# Patient Record
Sex: Female | Born: 1950
Health system: Southern US, Community
[De-identification: ages and names within clinical notes are randomized; demographics above are authoritative.]

## PROBLEM LIST (undated history)

## (undated) DIAGNOSIS — F32A Depression, unspecified: Secondary | ICD-10-CM

## (undated) DIAGNOSIS — J984 Other disorders of lung: Secondary | ICD-10-CM

## (undated) DIAGNOSIS — J189 Pneumonia, unspecified organism: Secondary | ICD-10-CM

## (undated) DIAGNOSIS — I10 Essential (primary) hypertension: Secondary | ICD-10-CM

## (undated) DIAGNOSIS — J45909 Unspecified asthma, uncomplicated: Secondary | ICD-10-CM

## (undated) DIAGNOSIS — F329 Major depressive disorder, single episode, unspecified: Secondary | ICD-10-CM

## (undated) DIAGNOSIS — M109 Gout, unspecified: Secondary | ICD-10-CM

## (undated) DIAGNOSIS — K219 Gastro-esophageal reflux disease without esophagitis: Secondary | ICD-10-CM

## (undated) DIAGNOSIS — H3320 Serous retinal detachment, unspecified eye: Secondary | ICD-10-CM

## (undated) DIAGNOSIS — E119 Type 2 diabetes mellitus without complications: Secondary | ICD-10-CM

## (undated) DIAGNOSIS — J40 Bronchitis, not specified as acute or chronic: Secondary | ICD-10-CM

## (undated) DIAGNOSIS — H269 Unspecified cataract: Secondary | ICD-10-CM

## (undated) DIAGNOSIS — E785 Hyperlipidemia, unspecified: Secondary | ICD-10-CM

## (undated) HISTORY — DX: Other disorders of lung: J98.4

## (undated) HISTORY — PX: BREAST BIOPSY: SHX20

## (undated) HISTORY — PX: TUBAL LIGATION: SHX77

## (undated) HISTORY — PX: ANKLE SURGERY: SHX546

## (undated) HISTORY — PX: MELANOMA EXCISION: SHX5266

## (undated) HISTORY — PX: EYE SURGERY: SHX253

## (undated) HISTORY — PX: TONSILLECTOMY: SUR1361

## (undated) HISTORY — PX: BLADDER SURGERY: SHX569

## (undated) NOTE — Progress Notes (Signed)
 Associated Order(s): Laceration Repair Post-Procedure Diagnose(s): Laceration of right elbow, initial encounter Formatting of this note is different from the original. Images from the original note were not included. [facility]   Subjective:   Patient ID: Samantha Mcgrath is an 55 y.o. female.  Chief Complaint:  Chief Complaint  Patient presents with  ? Laceration    Right elbow, fell today   [address] - [place], [place] - [address]  HPI: Samantha Mcgrath is an 23 y.o. female who presents to urgent care with complaints of right elbow injury. The patient notes she fell while walking down stairs when she fell on to right elbow and sustained a laceration. She denies any pain at this time. She denies any other associated symptoms.   Laceration     No past medical history on file.  I have reviewed surgical, social, and family history.  Allergies  Allergen Reactions  ? Anesthetics - [place]ster Type- (Drug Class Allergy) Palpitations and Other (See Comments)    Novocain. ALSO BLISTERS WITH TOPICAL,    Novocain. ALSO BLISTERS WITH TOPICAL  ALSO BLISTERS WITH TOPICAL  Novacaine    Novocain. ALSO BLISTERS WITH TOPICAL  ALSO BLISTERS WITH TOPICAL   Current Outpatient Medications  Medication Instructions  ? albuterol (VENTOLIN HFA) 90 mcg/Actuation inhaler INHALE 1 TO 2 PUFFS EVERY 4 HOURS AS NEEDED  ? amLODIPine  (NORVASC ) 10 mg  ? atorvastatin  (LIPITOR) 20 mg, DAILY  ? Chlorthalidone  25 mg tablet TAKE 1 TABLET BY MOUTH EVERY DAY IN THE MORNING WITH FOOD FOR 90 DAYS  ? diclofenac  sodium (VOLTAREN ) 1 % topical gel APPLY 2 GRAMS TOPICALLY 4 (FOUR) TIMES DAILY. APPLY TO LEFT KNEE  ? dicyclomine (BENTYL) 10 mg  ? dulaglutide (TRULICITY) 1.5 mg  ? empagliflozin (JARDIANCE) 25 mg, EVERY MORNING  ? losartan  (COZAAR ) 100 mg  ? montelukast  (SINGULAIR ) 10 mg  ? pantoprazole  (PROTONIX ) 40 mg, TWICE A DAY  ? tiZANidine  (ZANAFLEX ) 4 mg tablet TAKE 1 TABLET  ORALLY TWICE A DAY AS NEEDED 90 DAYS   Review of Systems  Constitutional:  Negative for chills, diaphoresis and fatigue.  HENT:  Negative for congestion, sore throat and trouble swallowing.   Eyes:  Negative for pain and redness.  Respiratory:  Negative for cough, shortness of breath and wheezing.   Cardiovascular:  Negative for chest pain.  Gastrointestinal:  Negative for constipation and diarrhea.  Genitourinary:  Negative for dysuria, frequency and urgency.  Musculoskeletal:  Negative for arthralgias, joint swelling and myalgias.  Skin:  Positive for wound. Negative for rash.  Neurological:  Negative for dizziness and headaches.   Objective:   BP 125/63   Pulse 73   Temp 98.2 F (36.8 C) (Tympanic)   Resp 18   Ht 5' 5 (165.1 cm)   Wt 92.7 kg (204 lb 6.4 oz)   SpO2 99%   BMI 34.01 kg/m   Physical Exam Vitals and nursing note reviewed.  Constitutional:      General: She is not in acute distress.    Appearance: Normal appearance. She is well-developed. She is not ill-appearing or toxic-appearing.  HENT:     Head: Normocephalic and atraumatic.     Nose: Nose normal.     Mouth/Throat:     Mouth: Mucous membranes are moist.     Pharynx: Oropharynx is clear.  Pulmonary:     Effort: Pulmonary effort is normal.   Musculoskeletal:     Cervical back: Normal range of motion and neck supple.  Skin:    General: Skin is warm.     Capillary Refill: Capillary refill takes less than 2 seconds.     Findings: Laceration and wound present. No rash.      Comments: 1.5cm linear/stellate laceration over right elbow   Neurological:     General: No focal deficit present.     Mental Status: She is alert and oriented to person, place, and time.   Psychiatric:        Behavior: Behavior is cooperative.   Laceration Repair  Date/Time: 01/09/2024 8:11 PM  Performed by: Rosine Toribio Lot, APRN Authorized by: Rosine Toribio Lot, APRN   Consent:    Consent obtained:  Verbal    Consent given by:  Patient   Risks, benefits, and alternatives were discussed: yes     Risks discussed:  Infection, pain and poor cosmetic result   Alternatives discussed:  No treatment Universal protocol:    Procedure explained and questions answered to patient or proxy's satisfaction: yes     Patient identity confirmed:  Verbally with patient Anesthesia:    Anesthesia method:  Local infiltration   Local anesthetic:  Lidocaine  1% w/o epi Laceration details:    Location:  Shoulder/arm   Shoulder/arm location:  R elbow   Length (cm):  1.5   Depth (mm):  6 Pre-procedure details:    Preparation:  Patient was prepped and draped in usual sterile fashion Exploration:    Wound exploration: wound explored through full range of motion and entire depth of wound visualized     Wound extent: no foreign bodies/material noted, no muscle damage noted and no tendon damage noted     Contaminated: no   Treatment:    Area cleansed with:  Povidone-iodine   Amount of cleaning:  Standard   Irrigation solution:  Sterile saline   Irrigation volume:  30   Irrigation method:  Syringe   Visualized foreign bodies/material removed: no     Debridement:  None   Undermining:  None Skin repair:    Repair method:  Sutures   Suture size:  4-0   Suture material:  Nylon   Suture technique:  Simple interrupted   Number of sutures:  4 Approximation:    Approximation:  Close Repair type:    Repair type:  Simple Post-procedure details:    Dressing:  Adhesive bandage   Procedure completion:  Tolerated well, no immediate complications  Assessment:   1. Laceration of right elbow, initial encounter  Laceration Repair    No results found for any visits on 01/09/24.  Plan:   Last TDAP- 10/2014 - pt declines booster.  -Take medications as prescribed. -Medication dose, route and expected duration of medication explained to patient  -If treated with an antibiotic the benefits and risks of antibiotic use have  been discussed with the patient. Patient agrees to take fully and as directed.  -Follow up with Family Provider in 5-7 days if no better. -Answered any questions and concerns of patient -Discussed worsening sxs that would warrant ED evaluation. -Go to ER if symptoms worsen. -Patient understands this plan of care   Patient Instructions: Return in 10-14 days for suture removal Keep the wound clean and dry.  Cover your wound while in any dirty or dusty environments. Keep sutures dry for the first 24 hours, then gently clean with soap and water and pat dry.  Do not soak your wound. Monitor for signs or symptoms of infection including redness, warmth, yellow or thick drainage, or fever.  Follow up immediately if these symptoms occur. Electronically signed by Rosine Toribio Lot, APRN at 01/09/2024  8:33 PM EDT

---
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
## 2006-01-05 ENCOUNTER — Ambulatory Visit: Payer: Self-pay | Admitting: Family Medicine

## 2014-06-01 DIAGNOSIS — I10 Essential (primary) hypertension: Secondary | ICD-10-CM | POA: Insufficient documentation

## 2015-08-17 DIAGNOSIS — L97912 Non-pressure chronic ulcer of unspecified part of right lower leg with fat layer exposed: Secondary | ICD-10-CM | POA: Insufficient documentation

## 2015-08-17 DIAGNOSIS — T8131XA Disruption of external operation (surgical) wound, not elsewhere classified, initial encounter: Secondary | ICD-10-CM | POA: Insufficient documentation

## 2015-08-17 DIAGNOSIS — IMO0001 Reserved for inherently not codable concepts without codable children: Secondary | ICD-10-CM | POA: Insufficient documentation

## 2016-11-14 DIAGNOSIS — H33022 Retinal detachment with multiple breaks, left eye: Secondary | ICD-10-CM | POA: Insufficient documentation

## 2018-01-28 DIAGNOSIS — J45909 Unspecified asthma, uncomplicated: Secondary | ICD-10-CM | POA: Insufficient documentation

## 2018-01-28 DIAGNOSIS — E785 Hyperlipidemia, unspecified: Secondary | ICD-10-CM | POA: Insufficient documentation

## 2018-01-28 DIAGNOSIS — E119 Type 2 diabetes mellitus without complications: Secondary | ICD-10-CM | POA: Insufficient documentation

## 2018-01-28 DIAGNOSIS — Z794 Long term (current) use of insulin: Secondary | ICD-10-CM | POA: Insufficient documentation

## 2018-01-29 DIAGNOSIS — K219 Gastro-esophageal reflux disease without esophagitis: Secondary | ICD-10-CM | POA: Insufficient documentation

## 2018-08-15 DIAGNOSIS — H25811 Combined forms of age-related cataract, right eye: Secondary | ICD-10-CM | POA: Insufficient documentation

## 2018-12-24 DIAGNOSIS — M1712 Unilateral primary osteoarthritis, left knee: Secondary | ICD-10-CM | POA: Insufficient documentation

## 2019-03-28 DIAGNOSIS — F5101 Primary insomnia: Secondary | ICD-10-CM | POA: Insufficient documentation

## 2019-07-08 DIAGNOSIS — F419 Anxiety disorder, unspecified: Secondary | ICD-10-CM | POA: Insufficient documentation

## 2019-10-08 DIAGNOSIS — N1831 Chronic kidney disease, stage 3a: Secondary | ICD-10-CM | POA: Insufficient documentation

## 2019-10-08 DIAGNOSIS — N1832 Chronic kidney disease, stage 3b: Secondary | ICD-10-CM | POA: Insufficient documentation

## 2019-11-08 ENCOUNTER — Emergency Department
Admission: EM | Admit: 2019-11-08 | Discharge: 2019-11-08 | Disposition: A | Discharge status: Home / Self Care | Payer: Medicare HMO | Source: Home / Self Care | Attending: Family Medicine | Admitting: Family Medicine

## 2019-11-08 ENCOUNTER — Encounter: Payer: Self-pay | Admitting: Emergency Medicine

## 2019-11-08 ENCOUNTER — Other Ambulatory Visit: Payer: Self-pay

## 2019-11-08 DIAGNOSIS — J069 Acute upper respiratory infection, unspecified: Secondary | ICD-10-CM

## 2019-11-08 DIAGNOSIS — B349 Viral infection, unspecified: Secondary | ICD-10-CM

## 2019-11-08 DIAGNOSIS — J9801 Acute bronchospasm: Secondary | ICD-10-CM

## 2019-11-08 HISTORY — DX: Gout, unspecified: M10.9

## 2019-11-08 HISTORY — DX: Depression, unspecified: F32.A

## 2019-11-08 HISTORY — DX: Type 2 diabetes mellitus without complications: E11.9

## 2019-11-08 HISTORY — DX: Bronchitis, not specified as acute or chronic: J40

## 2019-11-08 HISTORY — DX: Hyperlipidemia, unspecified: E78.5

## 2019-11-08 HISTORY — DX: Essential (primary) hypertension: I10

## 2019-11-08 HISTORY — DX: Serous retinal detachment, unspecified eye: H33.20

## 2019-11-08 HISTORY — DX: Unspecified asthma, uncomplicated: J45.909

## 2019-11-08 HISTORY — DX: Gastro-esophageal reflux disease without esophagitis: K21.9

## 2019-11-08 HISTORY — DX: Unspecified cataract: H26.9

## 2019-11-08 HISTORY — DX: Pneumonia, unspecified organism: J18.9

## 2019-11-08 MED ORDER — DOXYCYCLINE HYCLATE 100 MG PO CAPS: 100.0000 mg | ORAL_CAPSULE | Freq: Two times a day (BID) | ORAL | 0 refills | Status: DC

## 2019-11-08 MED ORDER — METHYLPREDNISOLONE SODIUM SUCC 125 MG IJ SOLR
80.0000 mg | Freq: Once | INTRAMUSCULAR | Status: AC
  Administered 2019-11-08: 80 mg via INTRAMUSCULAR

## 2019-11-08 MED ORDER — HYDROCODONE-GUAIFENESIN 2.5-200 MG/5ML PO SOLN: ORAL | 0 refills | Status: DC

## 2019-11-08 MED ORDER — PREDNISONE 20 MG PO TABS
ORAL_TABLET | ORAL | 0 refills | Status: DC
Start: 2019-11-08 — End: 2020-07-12

## 2019-11-08 NOTE — ED Provider Notes (Signed)
[facility]    CSN: [id number] Arrival date & time: 11/08/19  1327      History   Chief Complaint Chief Complaint  Patient presents with  . Nasal Congestion  . Cough    HPI Samantha Mcgrath is a 69 y.o. female.   Patient has seasonal rhinitis for which she takes Zyrtec, and her congestion became worse during the past week.  She developed a non-productive cough 4 days ago, worse at night.  She denies pleuritic pain and shortness of breath.  She denies fevers, chills, and sweats.  She has had both COVID19 injections.  She had pneumonia in 2017 She has type 2 diabetes, and states that her Hgb A1c was 6.9 several weeks ago. She states that she is preparing to go to the [facility].  The history is provided by the patient.    Past Medical History:  Diagnosis Date  . Asthma   . Bronchitis   . Cataract    bilateral  . Depression   . Diabetes mellitus without complication ([place])   . GERD (gastroesophageal reflux disease)   . Gout   . Hyperlipidemia   . Hypertension   . Pneumonia   . Retinal detachment    left    Patient Active Problem List   Diagnosis Date Noted  . Stage 3b chronic kidney disease 10/08/2019  . Anxiety 07/08/2019  . Combined forms of age-related cataract of right eye 08/15/2018  . Gastroesophageal reflux disease without esophagitis 01/29/2018  . Asthma 01/28/2018  . Hyperlipidemia 01/28/2018  . Type 2 diabetes mellitus without complication, with long-term current use of insulin ([place]) 01/28/2018  . Retinal detachment of left eye with multiple breaks 11/14/2016  . Morbid obesity ([place]) 08/17/2015  . Essential hypertension 06/01/2014    Past Surgical History:  Procedure Laterality Date  . BLADDER SURGERY    . EYE SURGERY    . TONSILLECTOMY    . TUBAL LIGATION      OB History   No obstetric history on file.      Home Medications    Prior to Admission medications   Medication Sig Start Date End Date Taking? Authorizing Provider   atorvastatin (LIPITOR) 10 MG tablet Take by mouth. 05/12/19  Yes [provider]  baclofen (LIORESAL) 20 MG tablet Take by mouth. 11/04/19 12/04/19 Yes [provider]  chlorthalidone (HYGROTON) 50 MG tablet Take by mouth. 05/12/19  Yes [provider]  etodolac (LODINE) 500 MG tablet TAKE ONE TABLET BY MOUTH TWICE A DAY 11/05/19  Yes [provider]  famotidine (PEPCID) 40 MG tablet Take by mouth. 05/27/19  Yes [provider]  glipiZIDE (GLUCOTROL XL) 5 MG 24 hr tablet Take by mouth. 04/16/19  Yes [provider]  liraglutide (VICTOZA) 18 MG/3ML SOPN  05/07/18  Yes [provider]  metFORMIN (GLUCOPHAGE-XR) 500 MG 24 hr tablet TAKE TWO TABLETS BY MOUTH TWICE A DAY 08/26/16  Yes [provider]  montelukast (SINGULAIR) 10 MG tablet Take by mouth. 11/09/16  Yes [provider]  pantoprazole (PROTONIX) 40 MG tablet Take by mouth. 07/30/18  Yes [provider]  pravastatin (PRAVACHOL) 40 MG tablet TAKE 1 TABLET BY MOUTH AT BEDTIME. 10/08/16  Yes [provider]  sertraline (ZOLOFT) 50 MG tablet Take by mouth. 06/11/18  Yes [provider]  tiZANidine (ZANAFLEX) 4 MG tablet Take by mouth. 04/18/19  Yes [provider]  doxycycline (VIBRAMYCIN) 100 MG capsule Take 1 capsule (100 mg total) by mouth  2 (two) times daily. Take with food (Rx void after 11/18/19) 11/08/19   Kandra Nicolas, MD  HYDROcodone-guaiFENesin 2.5-200 MG/5ML SOLN Take 44mL PO Q6hr PRN cough 11/08/19   Kandra Nicolas, MD  ketorolac (ACULAR) 0.4 % SOLN 1 drop 4 (four) times daily.    [provider]  moxifloxacin (VIGAMOX) 0.5 % ophthalmic solution 1 drop 4 (four) times daily.    [provider]  predniSONE (DELTASONE) 20 MG tablet Take one tab by mouth twice daily for 4 days, then one daily for 3 days. Take with food. 11/08/19   Kandra Nicolas, MD    Family History No family history on file.  Social History Social  History   Tobacco Use  . Smoking status: Never Smoker  . Smokeless tobacco: Never Used  Substance Use Topics  . Alcohol use: Never  . Drug use: Not on file     Allergies   Procaine   Review of Systems Review of Systems + sore throat + cough No pleuritic pain + wheezing + nasal congestion + post-nasal drainage No sinus pain/pressure No itchy/red eyes No earache No hemoptysis No SOB No fever/chills No nausea No vomiting No abdominal pain No diarrhea No urinary symptoms No skin rash + fatigue No myalgias No headache Used OTC meds (Zyrtec) without relief   Physical Exam Triage Vital Signs ED Triage Vitals  Enc Vitals Group     BP 11/08/19 1355 (!) 181/74     Pulse Rate 11/08/19 1355 78     Resp 11/08/19 1355 18     Temp 11/08/19 1355 98.3 F (36.8 C)     Temp Source 11/08/19 1355 Oral     SpO2 11/08/19 1355 95 %     Weight 11/08/19 1356 230 lb (104.3 kg)     Height 11/08/19 1356 5' 5.5" (1.664 m)     Head Circumference --      Peak Flow --      Pain Score 11/08/19 1356 0     Pain Loc --      Pain Edu? --      Excl. in [place]? --    No data found.  Updated Vital Signs BP (!) 181/74 (BP Location: Right Arm)   Pulse 78   Temp 98.3 F (36.8 C) (Oral)   Resp 18   Ht 5' 5.5" (1.664 m)   Wt 104.3 kg   SpO2 95%   BMI 37.69 kg/m   Visual Acuity Right Eye Distance:   Left Eye Distance:   Bilateral Distance:    Right Eye Near:   Left Eye Near:    Bilateral Near:     Physical Exam Nursing notes and Vital Signs reviewed. Appearance:  Patient appears stated age, and in no acute distress Eyes:  Pupils are equal, round, and reactive to light and accomodation.  Extraocular movement is intact.  Conjunctivae are not inflamed  Ears:  Canals normal.  Tympanic membranes normal.  Nose:  Mildly congested turbinates.  No sinus tenderness.  Pharynx:  Normal Neck:  Supple.  Mildly enlarged lateral nodes are present, tender to palpation on the left.   Lungs:   Clear to auscultation.  Breath sounds are equal.  Moving air well. Heart:  Regular rate and rhythm without murmurs, rubs, or gallops.  Abdomen:  Nontender without masses or hepatosplenomegaly.  Bowel sounds are present.  No CVA or flank tenderness.  Extremities:  No edema.  Skin:  No rash present.   UC Treatments / Results  Labs (all labs ordered are listed, but only abnormal results are displayed) Labs Reviewed - No data to display  EKG   Radiology No results found.  Procedures Procedures (including critical care time)  Medications Ordered in UC Medications  methylPREDNISolone sodium succinate (SOLU-MEDROL) 125 mg/2 mL injection 80 mg (80 mg Intramuscular Given 11/08/19 1503)    Initial Impression / Assessment and Plan / UC Course  I have reviewed the triage vital signs and the nursing notes.  Pertinent labs & imaging results that were available during my care of the patient were reviewed by me and considered in my medical decision making (see chart for details).    There is no evidence of bacterial infection today.  Begin prednisone burst/taper. Rx for hydrocodone/guaifenesin syrup at patient's request.  Controlled Substance Prescriptions I have consulted the [place] Controlled Substances Registry for this patient, and feel the risk/benefit ratio today is favorable for proceeding with this prescription for a controlled substance.     Final Clinical Impressions(s) / UC Diagnoses   Final diagnoses:  Viral URI with cough  Acute bronchospasm due to viral infection     Discharge Instructions     Begin prednisone Sunday 11/09/19. Take plain guaifenesin (1200mg  extended release tabs such as Mucinex) twice daily, with plenty of water, for cough and congestion.  Get adequate rest.   May use Afrin nasal spray (or generic oxymetazoline) each morning for about 5 days and then discontinue.  Also recommend using saline nasal spray several times daily and saline nasal irrigation (AYR is a  common brand).  Use Flonase nasal spray each morning after using Afrin nasal spray and saline nasal irrigation. Try warm salt water gargles for sore throat.  Stop all antihistamines (Zyrtec, etc) for now, and other non-prescription cough/cold preparations. Begin doxycycline if not improving about one week or if persistent fever develops (Given a prescription to hold, with an expiration date)   Try to limit use of cough suppressant to bedtime after 2 to 3 days.  If symptoms become significantly worse during the night or over the weekend, proceed to the local emergency room.        ED Prescriptions    Medication Sig Dispense Auth. Provider   predniSONE (DELTASONE) 20 MG tablet Take one tab by mouth twice daily for 4 days, then one daily for 3 days. Take with food. 11 tablet Kandra Nicolas, MD   HYDROcodone-guaiFENesin 2.5-200 MG/5ML SOLN Take 62mL PO Q6hr PRN cough 100 mL Kandra Nicolas, MD   doxycycline (VIBRAMYCIN) 100 MG capsule Take 1 capsule (100 mg total) by mouth 2 (two) times daily. Take with food (Rx void after 11/18/19) 20 capsule Kandra Nicolas, MD        Kandra Nicolas, MD 11/10/19 [phone]

## 2019-11-08 NOTE — ED Triage Notes (Signed)
Patient reports a chronic cough; for past 4 days cough is more concerning and she is congested; denies fever; has history of bronchitis and pneumonia but does not feel chest imaging is necessary today; no OTCs.  Has had both doses covid vaccine in 1/21.

## 2019-11-08 NOTE — Discharge Instructions (Addendum)
Begin prednisone Sunday 11/09/19. Take plain guaifenesin (1200mg  extended release tabs such as Mucinex) twice daily, with plenty of water, for cough and congestion.  Get adequate rest.   May use Afrin nasal spray (or generic oxymetazoline) each morning for about 5 days and then discontinue.  Also recommend using saline nasal spray several times daily and saline nasal irrigation (AYR is a common brand).  Use Flonase nasal spray each morning after using Afrin nasal spray and saline nasal irrigation. Try warm salt water gargles for sore throat.  Stop all antihistamines (Zyrtec, etc) for now, and other non-prescription cough/cold preparations. Begin doxycycline if not improving about one week or if persistent fever develops  Try to limit use of cough suppressant to bedtime after 2 to 3 days.  If symptoms become significantly worse during the night or over the weekend, proceed to the local emergency room.

## 2020-01-06 DIAGNOSIS — R053 Chronic cough: Secondary | ICD-10-CM | POA: Insufficient documentation

## 2020-06-22 ENCOUNTER — Ambulatory Visit: Payer: Self-pay | Admitting: Family

## 2020-06-28 ENCOUNTER — Telehealth: Payer: Self-pay

## 2020-06-28 NOTE — Telephone Encounter (Signed)
Patient has an appointment with you on tomorrow and she has not received her new patient packet as of today. If packet is not received today patient will be coming in early tomorrow to fill it out. Please advise

## 2020-06-29 ENCOUNTER — Encounter: Payer: Self-pay | Admitting: Orthopedic Surgery

## 2020-06-29 ENCOUNTER — Other Ambulatory Visit: Payer: Self-pay

## 2020-06-29 ENCOUNTER — Ambulatory Visit (INDEPENDENT_AMBULATORY_CARE_PROVIDER_SITE_OTHER): Payer: Medicare HMO | Admitting: Orthopedic Surgery

## 2020-06-29 VITALS — BP 165/90 | HR 69 | Temp 97.5°F | Resp 20 | Ht 66.0 in | Wt 228.0 lb

## 2020-06-29 DIAGNOSIS — G2581 Restless legs syndrome: Secondary | ICD-10-CM

## 2020-06-29 DIAGNOSIS — R69 Illness, unspecified: Secondary | ICD-10-CM | POA: Diagnosis not present

## 2020-06-29 DIAGNOSIS — G4700 Insomnia, unspecified: Secondary | ICD-10-CM | POA: Diagnosis not present

## 2020-06-29 DIAGNOSIS — K219 Gastro-esophageal reflux disease without esophagitis: Secondary | ICD-10-CM

## 2020-06-29 DIAGNOSIS — M545 Low back pain, unspecified: Secondary | ICD-10-CM

## 2020-06-29 DIAGNOSIS — F325 Major depressive disorder, single episode, in full remission: Secondary | ICD-10-CM

## 2020-06-29 DIAGNOSIS — G8929 Other chronic pain: Secondary | ICD-10-CM

## 2020-06-29 DIAGNOSIS — I10 Essential (primary) hypertension: Secondary | ICD-10-CM

## 2020-06-29 DIAGNOSIS — Z1231 Encounter for screening mammogram for malignant neoplasm of breast: Secondary | ICD-10-CM

## 2020-06-29 DIAGNOSIS — E1122 Type 2 diabetes mellitus with diabetic chronic kidney disease: Secondary | ICD-10-CM | POA: Diagnosis not present

## 2020-06-29 DIAGNOSIS — J984 Other disorders of lung: Secondary | ICD-10-CM | POA: Diagnosis not present

## 2020-06-29 DIAGNOSIS — E1140 Type 2 diabetes mellitus with diabetic neuropathy, unspecified: Secondary | ICD-10-CM | POA: Diagnosis not present

## 2020-06-29 DIAGNOSIS — R000 Tachycardia, unspecified: Secondary | ICD-10-CM

## 2020-06-29 DIAGNOSIS — Z794 Long term (current) use of insulin: Secondary | ICD-10-CM | POA: Diagnosis not present

## 2020-06-29 MED ORDER — TIZANIDINE HCL 4 MG PO TABS: 4.0000 mg | ORAL_TABLET | Freq: Every day | ORAL | 1 refills | Status: AC | PRN

## 2020-06-29 MED ORDER — GABAPENTIN 100 MG PO CAPS: 100.0000 mg | ORAL_CAPSULE | Freq: Every day | ORAL | 0 refills | Status: DC

## 2020-06-29 MED ORDER — ASPIRIN EC 81 MG PO TBEC: 81.0000 mg | DELAYED_RELEASE_TABLET | Freq: Every day | ORAL | 1 refills | Status: DC

## 2020-06-29 NOTE — Patient Instructions (Signed)
Record blood pressure for 1 week, twice a day  Cut zoloft in half (25 mg ) for one week then may stop taking Stop nyquil and ibuprofen Please try Tylenol 1000 mg at night ( 2 extra strength) May try melatonin supplement for sleep if gabapentin does not worl   Sinus Tachycardia  Sinus tachycardia is a kind of fast heartbeat. In sinus tachycardia, the heart beats more than 100 times a minute. Sinus tachycardia starts in a part of the heart called the sinus node. Sinus tachycardia may be harmless, or it may be a sign of a serious condition. What are the causes? This condition may be caused by:  Exercise or exertion.  A fever.  Pain.  Loss of body fluids (dehydration).  Severe bleeding (hemorrhage).  Anxiety and stress.  Certain substances, including: ? Alcohol. ? Caffeine. ? Tobacco and nicotine products. ? Cold medicines. ? Illegal drugs.  Medical conditions including: ? Heart disease. ? An infection. ? An overactive thyroid (hyperthyroidism). ? A lack of red blood cells (anemia). What are the signs or symptoms? Symptoms of this condition include:  A feeling that the heart is beating quickly (palpitations).  Suddenly noticing your heartbeat (cardiac awareness).  Dizziness.  Tiredness (fatigue).  Shortness of breath.  Chest pain.  Nausea.  Fainting. How is this diagnosed? This condition is diagnosed with:  A physical exam.  Other tests, such as: ? Blood tests. ? An electrocardiogram (ECG). This test measures the electrical activity of the heart. ? Ambulatory cardiac monitor. This records your heartbeats for 24 hours or more. You may be referred to a heart specialist (cardiologist). How is this treated? Treatment for this condition depends on the cause or the underlying condition. Treatment may involve:  Treating the underlying condition.  Taking new medicines or changing your current medicines as told by your health care provider.  Making changes  to your diet or lifestyle. Follow these instructions at home: Lifestyle  Do not use any products that contain nicotine or tobacco, such as cigarettes and e-cigarettes. If you need help quitting, ask your health care provider.  Do not use illegal drugs, such as cocaine.  Learn relaxation methods to help you when you get stressed or anxious. These include deep breathing.  Avoid caffeine or other stimulants.   Alcohol use  Do not drink alcohol if: ? Your health care provider tells you not to drink. ? You are pregnant, may be pregnant, or are planning to become pregnant.  If you drink alcohol, limit how much you have: ? 0-1 drink a day for women. ? 0-2 drinks a day for men.  Be aware of how much alcohol is in your drink. In the [place] one drink equals one typical bottle of beer (12 oz), one-half glass of wine (5 oz), or one shot of hard liquor (1 oz).   General instructions  Drink enough fluids to keep your urine pale yellow.  Take over-the-counter and prescription medicines only as told by your health care provider.  Keep all follow-up visits as told by your health care provider. This is important. Contact a health care provider if you have:  A fever.  Vomiting or diarrhea that does not go away. Get help right away if you:  Have pain in your chest, upper arms, jaw, or neck.  Become weak or dizzy.  Feel faint.  Have palpitations that do not go away. Summary  In sinus tachycardia, the heart beats more than 100 times a minute.  Sinus tachycardia may  be harmless, or it may be a sign of a serious condition.  Treatment for this condition depends on the cause or the underlying condition.  Get help right away if you have pain in your chest, upper arms, jaw, or neck. This information is not intended to replace advice given to you by your health care provider. Make sure you discuss any questions you have with your health care provider. Document Revised: 07/11/2017 Document  Reviewed: 07/11/2017 Elsevier Patient Education  [place].

## 2020-06-29 NOTE — Progress Notes (Signed)
Careteam: Samantha Mcgrath Care Team: Samantha Mcgrath, No Pcp Per as PCP - General (General Practice)  Seen by: Hazle Nordmann, AGNP-C  PLACE OF SERVICE:  [facility]  Advanced Directive information    Allergies  Allergen Reactions  . Procaine Other (See Comments) and Palpitations    ALSO BLISTERS WITH TOPICAL Novacaine   Novocain. ALSO BLISTERS WITH TOPICAL    Chief Complaint  Samantha Mcgrath presents with  . Establish Care    Establish Care     HPI: Samantha Mcgrath is a 70 y.o. female seen today to establish at [facility].    Samantha Mcgrath was previous PCP at [facility]. She recently moved to [place] this past July. She wanted someone closer. Works as a Education officer, environmental at [employer]. Divorced about 45 years. 2 Children- son and daughter. Daughter in [place], son [place]. Lives in single story house. No pets.   Diabetes- diagnosed in in 2014. Takes glipizie and metformin daily. Does not limit diet in carbs and sugars. Has yearly diabetic eye exams- last one in June 2021. Does not check sugars anymore, use to.   Restrictive airway disease- she has had a cough for 40 years. Seen by many doctors around the country. Dr. Ebony Cargo, pulmonary specialist with [facility]. She has had many tests performed, cannot name them. Cough triggers including heat,chocolate, first bite of eating. Embarrassed during pandemic about cough. Does not take any medications for it. Uses PRN hydrocodone syrup only for special occassions.   Hiatal hernia/ GERD- takes protonix twice daily. Has had a long time. Does not know food triggers.   Chronic back pain- 2010 she hurt her back and was told she has 3 herniated discs. Pain has gone, but will flare up at times. States zanaflex is effective, would like refill for more.   High blood pressure- diagnosed a few years ago. Takes chlorthalidone daily. Scared to change medication. Had episodes of severe hypotension in past. Requesting to see cardiologist.   Insomnia-  started a few years ago. Looks at cell phone or television at night. Describes insomnia as mind racing. Taking nyquil to sleep.   Zoloft- does not know what she is on it . Was given to her 2 years ago when she was having challenges at church. Would like to stop.   Restless legs- started within last year. Only at night. Will take ibuprofen every night to help with pain, burning.   2 weeks ago, she putting christmas stuff away. She had a pain in her chest. She looked at her Apple watch and HR was 170. Watch also detected a-fib. She went to sleep and felt fine the next day. Son is a paramedic and he checked her heart rate and it was elevated. No chest pain second time. Requesting to see cardiologist.   Last dental check up. November 2021. Next one 07/2020. No issued with teeth.   Colonoscopy done in 2018. Sees Dr. Rhetta Mura. Advised repeat colonoscopy in 2023 due to history of polyps. No family history of colon cancer.   Mammograms has every year. Next one scheduled for this year.   Bone density test. - has not had one in a long time.   No more PAP smears. Has gynecologist, last one November 2021- was told she does not have to have anymore.   Non-smoker- never has.   Occasional drinker.   Does not use recreational drugs.   No recent falls, injuries or hospitalizations.   Has completed Moderna covid series with booster. Booster November  2021.   Completed annual flu vaccinations- last in October.   Unsure about   Has single shingles vaccine at age 63.   Unsure about tetnus  Review of Systems:  Review of Systems  Constitutional: Negative for fever and malaise/fatigue.  HENT: Negative for congestion, hearing loss and sore throat.   Eyes: Negative for blurred vision.  Respiratory: Positive for cough. Negative for shortness of breath and wheezing.   Cardiovascular: Positive for palpitations. Negative for chest pain and leg swelling.  Gastrointestinal: Positive for heartburn. Negative  for abdominal pain, constipation, nausea and vomiting.  Genitourinary: Negative for dysuria, frequency and hematuria.  Musculoskeletal: Positive for back pain, joint pain and myalgias. Negative for falls.  Neurological: Negative for dizziness, weakness and headaches.  Endo/Heme/Allergies: Positive for environmental allergies. Bruises/bleeds easily.  Psychiatric/Behavioral: Negative for depression and memory loss. The Samantha Mcgrath is not nervous/anxious.     Past Medical History:  Diagnosis Date  . Asthma   . Bronchitis   . Cataract    bilateral  . Depression   . Diabetes mellitus without complication (HCC)   . GERD (gastroesophageal reflux disease)   . Gout   . Hyperlipidemia   . Hypertension   . Pneumonia   . Restrictive airway disease   . Retinal detachment    left   Past Surgical History:  Procedure Laterality Date  . ANKLE SURGERY    . BLADDER SURGERY    . EYE SURGERY    . MELANOMA EXCISION     Of Right Leg  . TONSILLECTOMY    . TUBAL LIGATION     Social History:   reports that she has never smoked. She has never used smokeless tobacco. She reports current alcohol use. She reports that she does not use drugs.  History reviewed. No pertinent family history.  Medications: Samantha Mcgrath's Medications  New Prescriptions   No medications on file  Previous Medications   ATORVASTATIN (LIPITOR) 10 MG TABLET    Take by mouth.   CHLORTHALIDONE (HYGROTON) 50 MG TABLET    Take by mouth.   CHOLECALCIFEROL (VITAMIN D3) 250 MCG (10000 UT) CAPSULE    Take 10,000 Units by mouth daily.   GLIPIZIDE (GLUCOTROL XL) 5 MG 24 HR TABLET    Take by mouth.   HYDROCODONE-GUAIFENESIN 2.5-200 MG/5ML SOLN    Take 5mL PO Q6hr PRN cough   METFORMIN (GLUCOPHAGE-XR) 500 MG 24 HR TABLET    TAKE TWO TABLETS BY MOUTH TWICE A DAY   MONTELUKAST (SINGULAIR) 10 MG TABLET    Take by mouth.   PANTOPRAZOLE (PROTONIX) 40 MG TABLET    Take 40 mg by mouth 2 (two) times daily.   PREDNISONE (DELTASONE) 20 MG TABLET     Take one tab by mouth twice daily for 4 days, then one daily for 3 days. Take with food.   SERTRALINE (ZOLOFT) 50 MG TABLET    Take by mouth.   TIZANIDINE (ZANAFLEX) 4 MG TABLET    Take 4 mg by mouth daily as needed.  Modified Medications   No medications on file  Discontinued Medications   DOXYCYCLINE (VIBRAMYCIN) 100 MG CAPSULE    Take 1 capsule (100 mg total) by mouth 2 (two) times daily. Take with food (Rx void after 11/18/19)   ETODOLAC (LODINE) 500 MG TABLET    TAKE ONE TABLET BY MOUTH TWICE A DAY   FAMOTIDINE (PEPCID) 40 MG TABLET    Take by mouth.   KETOROLAC (ACULAR) 0.4 % SOLN    1 drop 4 (  four) times daily.   LIRAGLUTIDE (VICTOZA) 18 MG/3ML SOPN       MOXIFLOXACIN (VIGAMOX) 0.5 % OPHTHALMIC SOLUTION    1 drop 4 (four) times daily.   PRAVASTATIN (PRAVACHOL) 40 MG TABLET    TAKE 1 TABLET BY MOUTH AT BEDTIME.    Physical Exam:  Vitals:   06/29/20 1013  BP: (!) 165/90  Pulse: 69  Resp: 20  Temp: (!) 97.5 F (36.4 C)  SpO2: 97%  Weight: 228 lb (103.4 kg)  Height: 5\' 6"  (1.676 m)   Body mass index is 36.8 kg/m. Wt Readings from Last 3 Encounters:  06/29/20 228 lb (103.4 kg)  11/08/19 230 lb (104.3 kg)    Physical Exam Vitals reviewed.  Constitutional:      General: She is not in acute distress.    Appearance: She is obese.  HENT:     Head: Normocephalic.     Right Ear: There is no impacted cerumen.     Left Ear: There is no impacted cerumen.     Nose: Nose normal.     Mouth/Throat:     Mouth: Mucous membranes are moist.  Eyes:     General:        Right eye: No discharge.        Left eye: No discharge.  Cardiovascular:     Rate and Rhythm: Normal rate and regular rhythm.     Pulses: Normal pulses.     Heart sounds: Normal heart sounds. No murmur heard.     Comments: EKG NSR Pulmonary:     Effort: Pulmonary effort is normal. No respiratory distress.     Breath sounds: Normal breath sounds. No wheezing.  Abdominal:     General: Bowel sounds are normal.  There is no distension.     Palpations: Abdomen is soft.     Tenderness: There is no abdominal tenderness.  Musculoskeletal:     Cervical back: Normal range of motion.     Right lower leg: No edema.     Left lower leg: No edema.  Lymphadenopathy:     Cervical: No cervical adenopathy.  Skin:    General: Skin is warm and dry.     Capillary Refill: Capillary refill takes less than 2 seconds.  Neurological:     General: No focal deficit present.     Mental Status: She is alert and oriented to person, place, and time.     Motor: No weakness.     Gait: Gait normal.  Psychiatric:        Mood and Affect: Mood normal.        Behavior: Behavior normal.     Labs reviewed: Basic Metabolic Panel: No results for input(s): NA, K, CL, CO2, GLUCOSE, BUN, CREATININE, CALCIUM, MG, PHOS, TSH in the last 8760 hours. Liver Function Tests: No results for input(s): AST, ALT, ALKPHOS, BILITOT, PROT, ALBUMIN in the last 8760 hours. No results for input(s): LIPASE, AMYLASE in the last 8760 hours. No results for input(s): AMMONIA in the last 8760 hours. CBC: No results for input(s): WBC, NEUTROABS, HGB, HCT, MCV, PLT in the last 8760 hours. Lipid Panel: No results for input(s): CHOL, HDL, LDLCALC, TRIG, CHOLHDL, LDLDIRECT in the last 8760 hours. TSH: No results for input(s): TSH in the last 8760 hours. A1C: No results found for: HGBA1C   Assessment/Plan 1. Tachycardia - EKG sinus rhythm, asymptomatic at this time - suspect SVT or possibly afib/flutter - Ambulatory referral to Cardiology - EKG 12-Lead - aspirin EC  81 MG tablet; Take 1 tablet (81 mg total) by mouth daily. Swallow whole.  Dispense: 30 tablet; Refill: 1  2. Encounter for screening mammogram for malignant neoplasm of breast - DG Bone Density; Future - MM Digital Screening; Future  3. Controlled type 2 diabetes mellitus with neuropathy (HCC) - stable with glipizide and metformin - advised to limit foods high in carbs and sugar -  advised weight loss, < 1500 calories/daily - CMP - Hemoglobin A1c- 7.1 01/25 - Lipid Panel - Microalbumin/Creatinine Ratio, Urine  4. Restrictive airway disease - followed by Dr. Ebony Cargo - she has a chronic cough with many triggers - advised to continue to avoid triggers - do not advise hydrocodone syrup at this time  5. Gastroesophageal reflux disease without esophagitis - stable with protonix - continue to avoid food triggers  6. Chronic midline low back pain without sciatica - history of herniated discs, no pain at this time, ambulated well - tiZANidine (ZANAFLEX) 4 MG tablet; Take 1 tablet (4 mg total) by mouth daily as needed.  Dispense: 30 tablet; Refill: 1  7. Essential (primary) hypertension - bp at goal < 150/90, history of hypotension with bp medications - continue clorthalidone - CBC with Differential/Platelets - TSH  8. Insomnia, unspecified type - ongoing, states her mind is racing, also restless legs at night - will try gapabentin at night to help with restless legs and insomnia - gabapentin (NEURONTIN) 100 MG capsule; Take 1 capsule (100 mg total) by mouth at bedtime.  Dispense: 30 capsule; Refill: 0  9. Restless legs - same as above - possibly diabetic neuropathy  - will try gabapentin to help with legs - may consider r/o iron deficiency in future  - gabapentin (NEURONTIN) 100 MG capsule; Take 1 capsule (100 mg total) by mouth at bedtime.  Dispense: 30 capsule; Refill: 0  10. Depression, major, single episode, complete remission (HCC) - started on zoloft 2 years ago for issues in her life -she denies depression or anxiety at this time - advised to cut zoloft to 25 mg for one week then discontinue medication to prevent serotonin syndrome   I provided 65 minutes of non-face-to-face time during this encounter.      Next appt: 07/09/2020 Hazle Nordmann, Juel Burrow  [facility] [phone]

## 2020-06-30 LAB — COMPREHENSIVE METABOLIC PANEL
AG Ratio: 1.4 (calc) (ref 1.0–2.5)
ALT: 9 U/L (ref 6–29)
AST: 11 U/L (ref 10–35)
Albumin: 3.8 g/dL (ref 3.6–5.1)
Alkaline phosphatase (APISO): 85 U/L (ref 37–153)
BUN/Creatinine Ratio: 31 (calc) — ABNORMAL HIGH (ref 6–22)
BUN: 30 mg/dL — ABNORMAL HIGH (ref 7–25)
CO2: 29 mmol/L (ref 20–32)
Calcium: 9.7 mg/dL (ref 8.6–10.4)
Chloride: 104 mmol/L (ref 98–110)
Creat: 0.98 mg/dL — ABNORMAL HIGH (ref 0.60–0.93)
Globulin: 2.8 g/dL (calc) (ref 1.9–3.7)
Glucose, Bld: 91 mg/dL (ref 65–99)
Potassium: 4.4 mmol/L (ref 3.5–5.3)
Sodium: 142 mmol/L (ref 135–146)
Total Bilirubin: 0.3 mg/dL (ref 0.2–1.2)
Total Protein: 6.6 g/dL (ref 6.1–8.1)

## 2020-06-30 LAB — LIPID PANEL
Cholesterol: 202 mg/dL — ABNORMAL HIGH (ref <200)
HDL: 63 mg/dL (ref >=50)
LDL Cholesterol (Calc): 114 mg/dL (calc) — ABNORMAL HIGH
Non-HDL Cholesterol (Calc): 139 mg/dL (calc) — ABNORMAL HIGH (ref <130)
Total CHOL/HDL Ratio: 3.2 (calc) (ref <5.0)
Triglycerides: 141 mg/dL (ref <150)

## 2020-06-30 LAB — MICROALBUMIN / CREATININE URINE RATIO
Creatinine, Urine: 90 mg/dL (ref 20–275)
Microalb Creat Ratio: 2 mcg/mg creat (ref <30)
Microalb, Ur: 0.2 mg/dL

## 2020-06-30 LAB — CBC WITH DIFFERENTIAL/PLATELET
Absolute Monocytes: 555 cells/uL (ref 200–950)
Basophils Absolute: 110 cells/uL (ref 0–200)
Basophils Relative: 1.5 %
Eosinophils Absolute: 1175 cells/uL — ABNORMAL HIGH (ref 15–500)
Eosinophils Relative: 16.1 %
HCT: 35.8 % (ref 35.0–45.0)
Hemoglobin: 11.5 g/dL — ABNORMAL LOW (ref 11.7–15.5)
Lymphs Abs: 1876 cells/uL (ref 850–3900)
MCH: 27.3 pg (ref 27.0–33.0)
MCHC: 32.1 g/dL (ref 32.0–36.0)
MCV: 84.8 fL (ref 80.0–100.0)
MPV: 10.9 fL (ref 7.5–12.5)
Monocytes Relative: 7.6 %
Neutro Abs: 3584 cells/uL (ref 1500–7800)
Neutrophils Relative %: 49.1 %
Platelets: 476 10*3/uL — ABNORMAL HIGH (ref 140–400)
RBC: 4.22 10*6/uL (ref 3.80–5.10)
RDW: 14.5 % (ref 11.0–15.0)
Total Lymphocyte: 25.7 %
WBC: 7.3 10*3/uL (ref 3.8–10.8)

## 2020-06-30 LAB — HEMOGLOBIN A1C
Hgb A1c MFr Bld: 7.1 % of total Hgb — ABNORMAL HIGH (ref <5.7)
Mean Plasma Glucose: 157 mg/dL
eAG (mmol/L): 8.7 mmol/L

## 2020-06-30 LAB — TSH: TSH: 3.4 mIU/L (ref 0.40–4.50)

## 2020-07-01 ENCOUNTER — Other Ambulatory Visit: Payer: Self-pay | Admitting: Orthopedic Surgery

## 2020-07-01 DIAGNOSIS — Z13820 Encounter for screening for osteoporosis: Secondary | ICD-10-CM

## 2020-07-01 DIAGNOSIS — Z1231 Encounter for screening mammogram for malignant neoplasm of breast: Secondary | ICD-10-CM

## 2020-07-09 ENCOUNTER — Ambulatory Visit: Payer: Medicare HMO | Admitting: Orthopedic Surgery

## 2020-07-11 NOTE — Progress Notes (Signed)
Cardiology Office Note:    Date:  07/12/2020   ID:  Samantha Mcgrath, DOB 1950/11/30, MRN [id number]  PCP:  Yvonna Alanis, NP  Cardiologist:  Buford Dresser, MD  Referring MD: Yvonna Alanis, NP   CC: new patient consultation for tachycardia  History of Present Illness:    Samantha Mcgrath is a 70 y.o. female with a hx of hypertension, type II diabetes, hiatal hernia/GERD, chronic cough who is seen as a new consult at the request of [place], Amy E, NP for the evaluation and management of tachycardia.  Note from Windell Moulding, NP from 06/29/20 reviewed. Had an episode mid-January when she had chest pain, looked at her Apple watch and noted heart rate of 170, labeled as atrial fibrillation by the watch. Resolved on its own. ECG at visit was sinus rhythm.  Today: Had confirmed afib by her son, who is a paramedic.Had an event while staying with her son, he said absolutely it was atrial fibrillation at 140 bpm by taking her pulse and looking at the Apple ECG. That occurred 1/15-1/16. Happened again the next weekend, could tell that her heart was racing. Happening about once/week. Related to exertion. Brief episodes, go away with sitting down and taking deep breaths. Gets dizzy, chest pain, short of breath when this occurs.  -Prior cardiac history: none -ECG: NSR at 65 bpm -Prior workup: none -Prior treatment: none -Caffeine: no coffee/tea, 2-3 times/week has a diet coke -Alcohol: 1-2 times/year -Tobacco: never -OTC supplements: none -Comorbidities: diabetes, hypertension, obesity -Exercise level: largely sedentary, but has worked hard with taking down [employer], etc. Does physical work at [employer] too.  -Labs: TSH, kidney function/electrolytes, CBC reviewed. She is anemic, Hgb 11.5. Platelets 476k. Eosinophils very elevated, 1175 cells/uL, 16.1% of leukocytes. -Cardiac ROS: no chest pain, no shortness of breath, no PND, no orthopnea, no LE edema. -Family history: mom died age 26, had a  long history of pulmonary disease, dad died at age 67 (dropped dead, unknown cause).  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   We discussed her eosinophil count. I cannot see all of them, but she looked back and they had always been normal. Has a follow up later this month with her PCP. No fevers/chills/sweats, no unintentional weight loss. No changes in GI pattern, no bleeding.   ROS positive for small skin hemorrhages intermittently, several today. Not related to trauma most of the time, just appear randomly. Started in the last year.   Past Medical History:  Diagnosis Date  . Asthma   . Bronchitis   . Cataract    bilateral  . Depression   . Diabetes mellitus without complication ([place])   . GERD (gastroesophageal reflux disease)   . Gout   . Hyperlipidemia   . Hypertension   . Pneumonia   . Restrictive airway disease   . Retinal detachment    left    Past Surgical History:  Procedure Laterality Date  . ANKLE SURGERY    . BLADDER SURGERY    . EYE SURGERY    . MELANOMA EXCISION     Of Right Leg  . TONSILLECTOMY    . TUBAL LIGATION      Current Medications: Current Outpatient Medications on File Prior to Visit  Medication Sig  . aspirin EC 81 MG tablet Take 1 tablet (81 mg total) by mouth daily. Swallow whole.  Marland Kitchen atorvastatin (LIPITOR) 10 MG tablet Take 10 mg by mouth daily.  . chlorthalidone (HYGROTON) 50 MG  tablet Take 50 mg by mouth daily.  . Cholecalciferol (VITAMIN D3) 250 MCG (10000 UT) capsule Take 10,000 Units by mouth daily.  Marland Kitchen gabapentin (NEURONTIN) 100 MG capsule Take 1 capsule (100 mg total) by mouth at bedtime.  Marland Kitchen glipiZIDE (GLUCOTROL XL) 5 MG 24 hr tablet Take 5 mg by mouth at bedtime.  . metFORMIN (GLUCOPHAGE-XR) 500 MG 24 hr tablet TAKE TWO TABLETS BY MOUTH TWICE A DAY  . montelukast (SINGULAIR) 10 MG tablet Take 10 mg by mouth at bedtime.  . pantoprazole (PROTONIX) 40 MG tablet Take 40 mg by mouth 2 (two) times daily.  . predniSONE (DELTASONE) 20 MG tablet  Take one tab by mouth twice daily for 4 days, then one daily for 3 days. Take with food. (Patient taking differently: Take 20 mg by mouth as needed. Take one tab by mouth twice daily for 4 days, then one daily for 3 days. Take with food.)  . tiZANidine (ZANAFLEX) 4 MG tablet Take 1 tablet (4 mg total) by mouth daily as needed.   No current facility-administered medications on file prior to visit.     Allergies:   Procaine   Social History   Tobacco Use  . Smoking status: Never Smoker  . Smokeless tobacco: Never Used  Vaping Use  . Vaping Use: Never used  Substance Use Topics  . Alcohol use: Yes    Comment: Social Drinker  . Drug use: Never    Family History: mom died age 60, had a long history of pulmonary disease, dad died at age 25 (dropped dead, unknown cause).  ROS:   Please see the history of present illness.  Additional pertinent ROS: Constitutional: Negative for chills, fever, night sweats, unintentional weight loss  HENT: Negative for ear pain and hearing loss.   Eyes: Negative for loss of vision and eye pain.  Respiratory: Negative for cough, sputum, wheezing.   Cardiovascular: See HPI. Gastrointestinal: Negative for abdominal pain, melena, and hematochezia.  Genitourinary: Negative for dysuria and hematuria.  Musculoskeletal: Negative for falls and myalgias.  Skin: Negative for itching and rash.  Neurological: Negative for focal weakness, focal sensory changes and loss of consciousness.  Endo/Heme/Allergies: See above, easily develops skin hemorrhage     EKGs/Labs/Other Studies Reviewed:    The following studies were reviewed today: No prior cardiac studies  EKG:  EKG is personally reviewed.  The ekg ordered today demonstrates NSR at 65 bpm  Recent Labs: 06/29/2020: ALT 9; BUN 30; Creat 0.98; Hemoglobin 11.5; Platelets 476; Potassium 4.4; Sodium 142; TSH 3.40  Recent Lipid Panel    Component Value Date/Time   CHOL 202 (H) 06/29/2020 0000   TRIG 141  06/29/2020 0000   HDL 63 06/29/2020 0000   CHOLHDL 3.2 06/29/2020 0000   LDLCALC 114 (H) 06/29/2020 0000    Physical Exam:    VS:  BP 140/76   Pulse 65   Ht 5\' 5"  (1.651 m)   Wt 225 lb (102.1 kg)   SpO2 100%   BMI 37.44 kg/m     Wt Readings from Last 3 Encounters:  07/12/20 225 lb (102.1 kg)  06/29/20 228 lb (103.4 kg)  11/08/19 230 lb (104.3 kg)    GEN: Well nourished, well developed in no acute distress HEENT: Normal, moist mucous membranes NECK: No JVD CARDIAC: regular rhythm, normal S1 and S2, no rubs or gallops. No murmur. VASCULAR: Radial and DP pulses 2+ bilaterally. No carotid bruits RESPIRATORY:  Clear to auscultation without rales, wheezing or rhonchi  ABDOMEN:  Soft, non-tender, non-distended MUSCULOSKELETAL:  Ambulates independently SKIN: Warm and dry, no edema NEUROLOGIC:  Alert and oriented x 3. No focal neuro deficits noted. PSYCHIATRIC:  Normal affect    ASSESSMENT:    1. Paroxysmal tachycardia ([place])   2. Eosinophilia, unspecified type   3. Essential hypertension   4. Type 2 diabetes mellitus without complication, without long-term current use of insulin (HCC)   5. Cardiac risk counseling   6. Counseling on health promotion and disease prevention   7. At increased risk for cardiovascular disease    PLAN:    Paroxysmal tachycardia, concerning for atrial fibrillation -CHA2DS2/VAS Stroke Risk Points=4 -she reports that her son, who is a paramedic, confirmed at one point it was atrial fibrillation.  -would like to confirm this as the rhythm prior to starting anticoagulant, so Zio monitor ordered -will get echocardiogram as well -ordered for PRN diltiazem  Eosinophilia: Hold on anticoagulation until CBC repeated -has upcoming follow up with PCP  Hypertension -slightly elevated today -on chlorthalidone -starting PRN diltiazem today, monitor  Type II diabetes -on metformin -last A1c 7.1  -on aspirin, atorvastatin for elevated CV risk  Cardiac  risk counseling and prevention recommendations: -recommend heart healthy/Mediterranean diet, with whole grains, fruits, vegetable, fish, lean meats, nuts, and olive oil. Limit salt. -recommend moderate walking, 3-5 times/week for 30-50 minutes each session. Aim for at least 150 minutes.week. Goal should be pace of 3 miles/hours, or walking 1.5 miles in 30 minutes -recommend avoidance of tobacco products. Avoid excess alcohol. -ASCVD risk score: The 10-year ASCVD risk score Mikey Bussing DC Brooke Bonito., et al., 2013) is: 29%   Values used to calculate the score:     Age: 89 years     Sex: Female     Is Non-Hispanic African American: No     Diabetic: Yes     Tobacco smoker: No     Systolic Blood Pressure: [id number] mmHg     Is BP treated: Yes     HDL Cholesterol: 63 mg/dL     Total Cholesterol: 202 mg/dL    Plan for follow up: 1 mos  Buford Dresser, MD, PhD, [facility]    Medication Adjustments/Labs and Tests Ordered: Current medicines are reviewed at length with the patient today.  Concerns regarding medicines are outlined above.  Orders Placed This Encounter  Procedures  . LONG TERM MONITOR (3-14 DAYS)  . EKG 12-Lead  . ECHOCARDIOGRAM COMPLETE   Meds ordered this encounter  Medications  . diltiazem (CARDIZEM) 30 MG tablet    Sig: Take 1 tablet (30 mg total) by mouth 4 (four) times daily as needed.    Dispense:  30 tablet    Refill:  11    Patient Instructions  Medication Instructions:  Take diltiazem 30 mg 4 times daily as needed.  *If you need a refill on your cardiac medications before your next appointment, please call your pharmacy*   Lab Work: None   Testing/Procedures: Your physician has requested that you have an echocardiogram. Echocardiography is a painless test that uses sound waves to create images of your heart. It provides your doctor with information about the size and shape of your heart and how well your heart's chambers and valves are  working. This procedure takes approximately one hour. There are no restrictions for this procedure. [place] [address]  Our physician has recommended that you wear an 14  DAY ZIO-PATCH monitor. The Zio patch cardiac monitor continuously records heart rhythm data  for up to 14 days, this is for patients being evaluated for multiple types heart rhythms. For the first 24 hours post application, please avoid getting the Zio monitor wet in the shower or by excessive sweating during exercise. After that, feel free to carry on with regular activities. Keep soaps and lotions away from the ZIO XT Patch.   Someone from our office will call to verify address and mail monitor.     Follow-Up: At [facility], you and your health needs are our priority.  As part of our continuing mission to provide you with exceptional heart care, we have created designated Provider Care Teams.  These Care Teams include your primary Cardiologist (physician) and Advanced Practice Providers (APPs -  Physician Assistants and Nurse Practitioners) who all work together to provide you with the care you need, when you need it.  We recommend signing up for the patient portal called "MyChart".  Sign up information is provided on this After Visit Summary.  MyChart is used to connect with patients for Virtual Visits (Telemedicine).  Patients are able to view lab/test results, encounter notes, upcoming appointments, etc.  Non-urgent messages can be sent to your provider as well.   To learn more about what you can do with MyChart, go to [email or web address].    Your next appointment:   1 month(s)  The format for your next appointment:   In Person  Provider:   Buford Dresser, MD  [place] Instructions   Your physician has requested you wear your ZIO patch monitor__14_____days.   This is a single patch monitor.  [employer] supplies one patch monitor per enrollment.  Additional stickers are not  available.   Please do not apply patch if you will be having a Nuclear Stress Test, Echocardiogram, Cardiac CT, MRI, or Chest Xray during the time frame you would be wearing the monitor. The patch cannot be worn during these tests.  You cannot remove and re-apply the ZIO XT patch monitor.   Your ZIO patch monitor will be sent USPS Priority mail from [facility] directly to your home address. The monitor may also be mailed to a PO BOX if home delivery is not available.   It may take 3-5 days to receive your monitor after you have been enrolled.   Once you have received you monitor, please review enclosed instructions.  Your monitor has already been registered assigning a specific monitor serial # to you.   Applying the monitor   Shave hair from upper left chest.   Hold abrader disc by orange tab.  Rub abrader in 40 strokes over left upper chest as indicated in your monitor instructions.   Clean area with 4 enclosed alcohol pads .  Use all pads to assure are is cleaned thoroughly.  Let dry.   Apply patch as indicated in monitor instructions.  Patch will be place under collarbone on left side of chest with arrow pointing upward.   Rub patch adhesive wings for 2 minutes.Remove white label marked "1".  Remove white label marked "2".  Rub patch adhesive wings for 2 additional minutes.   While looking in a mirror, press and release button in center of patch.  A small green light will flash 3-4 times .  This will be your only indicator the monitor has been turned on.     Do not shower for the first 24 hours.  You may shower after the first 24 hours.   Press button if you feel  a symptom. You will hear a small click.  Record Date, Time and Symptom in the Patient Log Book.   When you are ready to remove patch, follow instructions on last 2 pages of Patient Log Book.  Stick patch monitor onto last page of Patient Log Book.   Place Patient Log Book in [place] box.  Use locking tab on box and  tape box closed securely.  The [employer] and [employer] has [employer] on it.  Please place in mailbox as soon as possible.  Your physician should have your test results approximately 7 days after the monitor has been mailed back to [facility].   Call [place] at [phone] if you have questions regarding your ZIO XT patch monitor.  Call them immediately if you see an orange light blinking on your monitor.   If your monitor falls off in less than 4 days contact our Monitor department at [phone].  If your monitor becomes loose or falls off after 4 days call [employer] at [phone] for suggestions on securing your monitor.        Signed, Buford Dresser, MD PhD 07/12/2020     [place]

## 2020-07-12 ENCOUNTER — Encounter: Payer: Self-pay | Admitting: Cardiology

## 2020-07-12 ENCOUNTER — Other Ambulatory Visit: Payer: Self-pay

## 2020-07-12 ENCOUNTER — Ambulatory Visit: Payer: Medicare HMO | Admitting: Cardiology

## 2020-07-12 ENCOUNTER — Ambulatory Visit (INDEPENDENT_AMBULATORY_CARE_PROVIDER_SITE_OTHER): Payer: Medicare HMO

## 2020-07-12 ENCOUNTER — Encounter: Payer: Self-pay | Admitting: *Deleted

## 2020-07-12 VITALS — BP 140/76 | HR 65 | Ht 65.0 in | Wt 225.0 lb

## 2020-07-12 DIAGNOSIS — I479 Paroxysmal tachycardia, unspecified: Secondary | ICD-10-CM

## 2020-07-12 DIAGNOSIS — E119 Type 2 diabetes mellitus without complications: Secondary | ICD-10-CM | POA: Diagnosis not present

## 2020-07-12 DIAGNOSIS — I10 Essential (primary) hypertension: Secondary | ICD-10-CM | POA: Diagnosis not present

## 2020-07-12 DIAGNOSIS — Z7189 Other specified counseling: Secondary | ICD-10-CM

## 2020-07-12 DIAGNOSIS — Z9189 Other specified personal risk factors, not elsewhere classified: Secondary | ICD-10-CM

## 2020-07-12 DIAGNOSIS — D721 Eosinophilia, unspecified: Secondary | ICD-10-CM

## 2020-07-12 MED ORDER — DILTIAZEM HCL 30 MG PO TABS: 30.0000 mg | ORAL_TABLET | Freq: Four times a day (QID) | ORAL | 11 refills | Status: DC | PRN

## 2020-07-12 NOTE — Patient Instructions (Signed)
Medication Instructions:  Take diltiazem 30 mg 4 times daily as needed.  *If you need a refill on your cardiac medications before your next appointment, please call your pharmacy*   Lab Work: None   Testing/Procedures: Your physician has requested that you have an echocardiogram. Echocardiography is a painless test that uses sound waves to create images of your heart. It provides your doctor with information about the size and shape of your heart and how well your heart's chambers and valves are working. This procedure takes approximately one hour. There are no restrictions for this procedure. [place] [address]  Our physician has recommended that you wear an 14  DAY ZIO-PATCH monitor. The Zio patch cardiac monitor continuously records heart rhythm data for up to 14 days, this is for patients being evaluated for multiple types heart rhythms. For the first 24 hours post application, please avoid getting the Zio monitor wet in the shower or by excessive sweating during exercise. After that, feel free to carry on with regular activities. Keep soaps and lotions away from the ZIO XT Patch.   Someone from our office will call to verify address and mail monitor.     Follow-Up: At [facility], you and your health needs are our priority.  As part of our continuing mission to provide you with exceptional heart care, we have created designated Provider Care Teams.  These Care Teams include your primary Cardiologist (physician) and Advanced Practice Providers (APPs -  Physician Assistants and Nurse Practitioners) who all work together to provide you with the care you need, when you need it.  We recommend signing up for the patient portal called "MyChart".  Sign up information is provided on this After Visit Summary.  MyChart is used to connect with patients for Virtual Visits (Telemedicine).  Patients are able to view lab/test results, encounter notes, upcoming appointments, etc.   Non-urgent messages can be sent to your provider as well.   To learn more about what you can do with MyChart, go to [email or web address].    Your next appointment:   1 month(s)  The format for your next appointment:   In Person  Provider:   Buford Dresser, MD  [place] Instructions   Your physician has requested you wear your ZIO patch monitor__14_____days.   This is a single patch monitor.  [employer] supplies one patch monitor per enrollment.  Additional stickers are not available.   Please do not apply patch if you will be having a Nuclear Stress Test, Echocardiogram, Cardiac CT, MRI, or Chest Xray during the time frame you would be wearing the monitor. The patch cannot be worn during these tests.  You cannot remove and re-apply the ZIO XT patch monitor.   Your ZIO patch monitor will be sent USPS Priority mail from [facility] directly to your home address. The monitor may also be mailed to a PO BOX if home delivery is not available.   It may take 3-5 days to receive your monitor after you have been enrolled.   Once you have received you monitor, please review enclosed instructions.  Your monitor has already been registered assigning a specific monitor serial # to you.   Applying the monitor   Shave hair from upper left chest.   Hold abrader disc by orange tab.  Rub abrader in 40 strokes over left upper chest as indicated in your monitor instructions.   Clean area with 4 enclosed alcohol pads .  Use all  pads to assure are is cleaned thoroughly.  Let dry.   Apply patch as indicated in monitor instructions.  Patch will be place under collarbone on left side of chest with arrow pointing upward.   Rub patch adhesive wings for 2 minutes.Remove white label marked "1".  Remove white label marked "2".  Rub patch adhesive wings for 2 additional minutes.   While looking in a mirror, press and release button in center of patch.  A small green light will  flash 3-4 times .  This will be your only indicator the monitor has been turned on.     Do not shower for the first 24 hours.  You may shower after the first 24 hours.   Press button if you feel a symptom. You will hear a small click.  Record Date, Time and Symptom in the Patient Log Book.   When you are ready to remove patch, follow instructions on last 2 pages of Patient Log Book.  Stick patch monitor onto last page of Patient Log Book.   Place Patient Log Book in [place] box.  Use locking tab on box and tape box closed securely.  The [employer] and [employer] has [employer] on it.  Please place in mailbox as soon as possible.  Your physician should have your test results approximately 7 days after the monitor has been mailed back to [facility].   Call [place] at [phone] if you have questions regarding your ZIO XT patch monitor.  Call them immediately if you see an orange light blinking on your monitor.   If your monitor falls off in less than 4 days contact our Monitor department at [phone].  If your monitor becomes loose or falls off after 4 days call [employer] at [phone] for suggestions on securing your monitor.

## 2020-07-12 NOTE — Progress Notes (Signed)
Patient ID: Samantha Mcgrath, female   DOB: Mar 28, 1951, 70 y.o.   MRN: [id number] Patient enrolled for [employer] to ship a 14 day ZIO XT long term holter monitor to his home.

## 2020-07-20 ENCOUNTER — Encounter: Payer: Self-pay | Admitting: Cardiology

## 2020-07-22 ENCOUNTER — Other Ambulatory Visit: Payer: Self-pay | Admitting: Orthopedic Surgery

## 2020-07-22 DIAGNOSIS — R000 Tachycardia, unspecified: Secondary | ICD-10-CM

## 2020-07-26 ENCOUNTER — Other Ambulatory Visit: Payer: Self-pay

## 2020-07-26 ENCOUNTER — Encounter (HOSPITAL_COMMUNITY): Payer: Self-pay

## 2020-07-26 DIAGNOSIS — M545 Low back pain, unspecified: Secondary | ICD-10-CM | POA: Diagnosis not present

## 2020-07-26 DIAGNOSIS — Z5321 Procedure and treatment not carried out due to patient leaving prior to being seen by health care provider: Secondary | ICD-10-CM | POA: Insufficient documentation

## 2020-07-26 NOTE — ED Triage Notes (Signed)
Pt reports lower back pain that radiates down left leg getting worse since Friday.

## 2020-07-27 ENCOUNTER — Emergency Department (HOSPITAL_COMMUNITY)
Admission: EM | Admit: 2020-07-27 | Discharge: 2020-07-27 | Disposition: A | Discharge status: Home / Self Care | Payer: Medicare HMO | Attending: Emergency Medicine | Admitting: Emergency Medicine

## 2020-07-27 ENCOUNTER — Ambulatory Visit: Payer: Medicare HMO | Admitting: Orthopedic Surgery

## 2020-07-27 ENCOUNTER — Encounter: Payer: Self-pay | Admitting: Orthopedic Surgery

## 2020-07-27 ENCOUNTER — Other Ambulatory Visit: Payer: Self-pay

## 2020-07-27 VITALS — BP 128/100 | HR 62 | Temp 97.5°F | Resp 20 | Ht 65.0 in | Wt 230.6 lb

## 2020-07-27 DIAGNOSIS — J984 Other disorders of lung: Secondary | ICD-10-CM

## 2020-07-27 DIAGNOSIS — D721 Eosinophilia, unspecified: Secondary | ICD-10-CM | POA: Diagnosis not present

## 2020-07-27 DIAGNOSIS — K219 Gastro-esophageal reflux disease without esophagitis: Secondary | ICD-10-CM | POA: Diagnosis not present

## 2020-07-27 DIAGNOSIS — D72110 Idiopathic hypereosinophilic syndrome (ihes): Secondary | ICD-10-CM

## 2020-07-27 DIAGNOSIS — I10 Essential (primary) hypertension: Secondary | ICD-10-CM

## 2020-07-27 DIAGNOSIS — M5442 Lumbago with sciatica, left side: Secondary | ICD-10-CM | POA: Diagnosis not present

## 2020-07-27 DIAGNOSIS — M5441 Lumbago with sciatica, right side: Secondary | ICD-10-CM

## 2020-07-27 DIAGNOSIS — F325 Major depressive disorder, single episode, in full remission: Secondary | ICD-10-CM

## 2020-07-27 DIAGNOSIS — G4700 Insomnia, unspecified: Secondary | ICD-10-CM

## 2020-07-27 DIAGNOSIS — E1140 Type 2 diabetes mellitus with diabetic neuropathy, unspecified: Secondary | ICD-10-CM

## 2020-07-27 DIAGNOSIS — R69 Illness, unspecified: Secondary | ICD-10-CM | POA: Diagnosis not present

## 2020-07-27 LAB — CBC WITH DIFFERENTIAL/PLATELET
Absolute Monocytes: 827 cells/uL (ref 200–950)
Basophils Absolute: 143 cells/uL (ref 0–200)
Basophils Relative: 1.5 %
Eosinophils Absolute: 551 cells/uL — ABNORMAL HIGH (ref 15–500)
Eosinophils Relative: 5.8 %
HCT: 36.5 % (ref 35.0–45.0)
Hemoglobin: 12.2 g/dL (ref 11.7–15.5)
Lymphs Abs: 2081 cells/uL (ref 850–3900)
MCH: 28.2 pg (ref 27.0–33.0)
MCHC: 33.4 g/dL (ref 32.0–36.0)
MCV: 84.5 fL (ref 80.0–100.0)
MPV: 10.6 fL (ref 7.5–12.5)
Monocytes Relative: 8.7 %
Neutro Abs: 5900 cells/uL (ref 1500–7800)
Neutrophils Relative %: 62.1 %
Platelets: 490 10*3/uL — ABNORMAL HIGH (ref 140–400)
RBC: 4.32 10*6/uL (ref 3.80–5.10)
RDW: 14.2 % (ref 11.0–15.0)
Total Lymphocyte: 21.9 %
WBC: 9.5 10*3/uL (ref 3.8–10.8)

## 2020-07-27 MED ORDER — PREDNISONE 10 MG PO TABS: ORAL_TABLET | ORAL | 0 refills | Status: AC

## 2020-07-27 NOTE — ED Notes (Signed)
Patient was called to recheck vital signs but no response.

## 2020-07-27 NOTE — Progress Notes (Signed)
Careteam: Patient Care Team: Yvonna Alanis, NP as PCP - General (Adult Health Nurse Practitioner) Buford Dresser, MD as PCP - Cardiology (Cardiology)  Seen by: Windell Moulding, AGNP-C  PLACE OF SERVICE:  [place] Directive information Does Patient Have a Medical Advance Directive?: Yes (Copy Requested)  Allergies  Allergen Reactions  . Procaine Other (See Comments) and Palpitations    ALSO BLISTERS WITH TOPICAL Novacaine   Novocain. ALSO BLISTERS WITH TOPICAL    Chief Complaint  Patient presents with  . Acute Visit    Patient has pain in her lower lumbar back area, and has trouble lifting her left leg     HPI: Patient is a 70 y.o. female seen today for management of chronic conditions.    This morning she was at [facility] ED due to low back pain. She was not seen by provider and decided to leave due to the long wait and appointment with [place] this morning. Back pain started 02/18 when she woke up. Pain located over her lower back and radiates to both knees. Hurts to cough. Cannot lift her left leg. History of herniated discs about 12 years ago. She has been taking tylenol 3-4 extra strength every 3-4 hours, muscle relaxer and deep blue topical pain reliever. These interventions have not helped decrease pain. Denies loss of bowel and bladder.   Tried taking gabapentin for restless legs. Thought it made restless legs worse and discontinued it after a few days. Still having insomnia. Taking naps during day when she can. Denies excessive daytime sleepiness.   She plans to start water aerobics in the Spring. She does not have a plan for weight loss. We discussed reducing calories and portion size tips due to her recent back injury.   Stopped the zoloft a month ago. No side effects. Denies depression or blues.   She saw cardiology 02/07. EKG did not show afib. They recommended zio patch. She started wearing it two weeks ago. Will be competed tomorrow. Does not  think she has been in afib lately. Scheduled to have eco feb 28th. She has not had to take prn diltiazem since seeing cardiology. Taking baby aspirin daily.   No recent hypoglycemic events. Tries to limit carbs and sugars from diet. Does not check blood glucose at home.   Plans to recheck eosinophil count today.   Review of Systems:  Review of Systems  Constitutional: Negative for chills, fever and malaise/fatigue.  HENT: Negative for congestion and sore throat.   Eyes: Negative for blurred vision and double vision.  Respiratory: Negative for cough, shortness of breath and wheezing.   Cardiovascular: Negative for chest pain, palpitations and leg swelling.  Gastrointestinal: Negative for abdominal pain, constipation, heartburn, nausea and vomiting.  Genitourinary: Negative for dysuria, frequency and hematuria.  Musculoskeletal: Positive for back pain, joint pain and myalgias. Negative for falls.  Neurological: Negative for dizziness, weakness and headaches.  Psychiatric/Behavioral: Negative for depression and suicidal ideas. The patient has insomnia. The patient is not nervous/anxious.     Past Medical History:  Diagnosis Date  . Asthma   . Bronchitis   . Cataract    bilateral  . Depression   . Diabetes mellitus without complication ([place])   . GERD (gastroesophageal reflux disease)   . Gout   . Hyperlipidemia   . Hypertension   . Pneumonia   . Restrictive airway disease   . Retinal detachment    left   Past Surgical History:  Procedure Laterality  Date  . ANKLE SURGERY    . BLADDER SURGERY    . EYE SURGERY    . MELANOMA EXCISION     Of Right Leg  . TONSILLECTOMY    . TUBAL LIGATION     Social History:   reports that she has never smoked. She has never used smokeless tobacco. She reports current alcohol use. She reports that she does not use drugs.  History reviewed. No pertinent family history.  Medications: Patient's Medications  New Prescriptions   No medications  on file  Previous Medications   ASPIRIN EC 81 MG TABLET    Take 1 tablet (81 mg total) by mouth daily. Swallow whole.   ATORVASTATIN (LIPITOR) 10 MG TABLET    Take 10 mg by mouth daily.   CHLORTHALIDONE (HYGROTON) 50 MG TABLET    Take 50 mg by mouth daily.   CHOLECALCIFEROL (VITAMIN D3) 250 MCG (10000 UT) CAPSULE    Take 10,000 Units by mouth daily.   DILTIAZEM (CARDIZEM) 30 MG TABLET    Take 1 tablet (30 mg total) by mouth 4 (four) times daily as needed.   GLIPIZIDE (GLUCOTROL XL) 5 MG 24 HR TABLET    Take 5 mg by mouth at bedtime.   METFORMIN (GLUCOPHAGE-XR) 500 MG 24 HR TABLET    TAKE TWO TABLETS BY MOUTH TWICE A DAY   MONTELUKAST (SINGULAIR) 10 MG TABLET    Take 10 mg by mouth at bedtime.   PANTOPRAZOLE (PROTONIX) 40 MG TABLET    Take 40 mg by mouth 2 (two) times daily.   PREDNISONE (DELTASONE) 20 MG TABLET    Take 20 mg by mouth once as needed.   TIZANIDINE (ZANAFLEX) 4 MG TABLET    Take 1 tablet (4 mg total) by mouth daily as needed.  Modified Medications   No medications on file  Discontinued Medications   GABAPENTIN (NEURONTIN) 100 MG CAPSULE    Take 1 capsule (100 mg total) by mouth at bedtime.    Physical Exam:  Vitals:   07/27/20 0925  BP: (!) 128/100  Pulse: 62  Resp: 20  Temp: (!) 97.5 F (36.4 C)  TempSrc: Temporal  SpO2: 99%  Weight: 230 lb 9.6 oz (104.6 kg)  Height: 5\' 5"  (1.651 m)   Body mass index is 38.37 kg/m. Wt Readings from Last 3 Encounters:  07/27/20 230 lb 9.6 oz (104.6 kg)  07/12/20 225 lb (102.1 kg)  06/29/20 228 lb (103.4 kg)    Physical Exam Vitals reviewed.  Constitutional:      General: She is not in acute distress.    Appearance: She is obese.  HENT:     Head: Normocephalic.  Cardiovascular:     Rate and Rhythm: Normal rate and regular rhythm.     Pulses: Normal pulses.     Heart sounds: Normal heart sounds. No murmur heard.   Pulmonary:     Effort: Pulmonary effort is normal. No respiratory distress.     Breath sounds: Normal  breath sounds. No wheezing.  Abdominal:     General: Bowel sounds are normal. There is no distension.     Palpations: Abdomen is soft.     Tenderness: There is no abdominal tenderness.  Musculoskeletal:     Cervical back: Normal and normal range of motion.     Thoracic back: Normal.     Lumbar back: Bony tenderness present. No swelling, deformity or signs of trauma. Decreased range of motion. Positive left straight leg raise test. Negative right straight  leg raise test.     Right lower leg: No edema.     Left lower leg: No edema.  Lymphadenopathy:     Cervical: No cervical adenopathy.  Skin:    General: Skin is warm and dry.     Capillary Refill: Capillary refill takes less than 2 seconds.  Neurological:     General: No focal deficit present.     Mental Status: She is alert and oriented to person, place, and time.     Motor: No weakness.     Gait: Gait abnormal.  Psychiatric:        Mood and Affect: Mood normal.        Behavior: Behavior normal.    Labs reviewed: Basic Metabolic Panel: Recent Labs    06/29/20 0000  NA 142  K 4.4  CL 104  CO2 29  GLUCOSE 91  BUN 30*  CREATININE 0.98*  CALCIUM 9.7  TSH 3.40   Liver Function Tests: Recent Labs    06/29/20 0000  AST 11  ALT 9  BILITOT 0.3  PROT 6.6   No results for input(s): LIPASE, AMYLASE in the last 8760 hours. No results for input(s): AMMONIA in the last 8760 hours. CBC: Recent Labs    06/29/20 0000  WBC 7.3  NEUTROABS 3,584  HGB 11.5*  HCT 35.8  MCV 84.8  PLT 476*   Lipid Panel: Recent Labs    06/29/20 0000  CHOL 202*  HDL 63  LDLCALC 114*  TRIG 141  CHOLHDL 3.2   TSH: Recent Labs    06/29/20 0000  TSH 3.40   A1C: Lab Results  Component Value Date   HGBA1C 7.1 (H) 06/29/2020     Assessment/Plan 1. Acute midline low back pain with bilateral sciatica - tenderness over lumbar region, limited ROM, pain when lifting left leg - recommend tylenol [phone] mg po tid prn for pain - may  continue xanaflex prn for muscle spasms - recommend light walking - avoid heavy lifting or vigorous exercise - will give prednisone to help with inflammation and pain - advised to see PCP if pain does not improve in 2 weeks - will consider xray due to history of disc herniation - predniSONE (DELTASONE) 10 MG tablet; Take 4 tablets (40 mg total) by mouth daily with breakfast for 1 day, THEN 3 tablets (30 mg total) daily with breakfast for 1 day, THEN 2 tablets (20 mg total) daily with breakfast for 1 day, THEN 1 tablet (10 mg total) daily with breakfast for 1 day, THEN 0.5 tablets (5 mg total) daily with breakfast for 1 day.  Dispense: 10.5 tablet; Refill: 0  2. Idiopathic hypereosinophilic syndrome - 26/83- 1,175 - will recheck today - CBC with Differential/Platelets  3. Controlled type 2 diabetes mellitus with neuropathy (HCC) - no recent hypoglycemic events - cont metformin and glipizide, asa, and statin - continue to limit carbs and sugars in diet - a1c- future - lipid panel- future  4. Restrictive airway disease - stable without medication  5. Gastroesophageal reflux disease without esophagitis - stable with protonix  6. Essential (primary) hypertension - bp elevated 128/100 - suspect due to acute back pain - cont chlorthalidone  - cont to limit diet in sodium < 2000 mg daily - cbc/diff- future - cmp- future  7. Insomnia, unspecified type - partly due to restless legs - admits to taking napes during day - advised to try OTC melatonin   8. Depression, major, single episode, complete remission ([place]) - resolved -  zoloft discontinued last month  I provided 35 minutes of face-to-face time during this encounter.     Next appt: Visit date not found [place], [facility] [phone]

## 2020-07-27 NOTE — Patient Instructions (Addendum)
Tylenol 1000 mg po- three times daily   Acute Back Pain, Adult Acute back pain is sudden and usually short-lived. It is often caused by an injury to the muscles and tissues in the back. The injury may result from:  A muscle or ligament getting overstretched or torn (strained). Ligaments are tissues that connect bones to each other. Lifting something improperly can cause a back strain.  Wear and tear (degeneration) of the spinal disks. Spinal disks are circular tissue that provide cushioning between the bones of the spine (vertebrae).  Twisting motions, such as while playing sports or doing yard work.  A hit to the back.  Arthritis. You may have a physical exam, lab tests, and imaging tests to find the cause of your pain. Acute back pain usually goes away with rest and home care. Follow these instructions at home: Managing pain, stiffness, and swelling  Treatment may include medicines for pain and inflammation that are taken by mouth or applied to the skin, prescription pain medicine, or muscle relaxants. Take over-the-counter and prescription medicines only as told by your health care provider.  Your health care provider may recommend applying ice during the first 24-48 hours after your pain starts. To do this: ? Put ice in a plastic bag. ? Place a towel between your skin and the bag. ? Leave the ice on for 20 minutes, 2-3 times a day.  If directed, apply heat to the affected area as often as told by your health care provider. Use the heat source that your health care provider recommends, such as a moist heat pack or a heating pad. ? Place a towel between your skin and the heat source. ? Leave the heat on for 20-30 minutes. ? Remove the heat if your skin turns bright red. This is especially important if you are unable to feel pain, heat, or cold. You have a greater risk of getting burned. Activity  Do not stay in bed. Staying in bed for more than 1-2 days can delay your  recovery.  Sit up and stand up straight. Avoid leaning forward when you sit or hunching over when you stand. ? If you work at a desk, sit close to it so you do not need to lean over. Keep your chin tucked in. Keep your neck drawn back, and keep your elbows bent at a 90-degree angle (right angle). ? Sit high and close to the steering wheel when you drive. Add lower back (lumbar) support to your car seat, if needed.  Take short walks on even surfaces as soon as you are able. Try to increase the length of time you walk each day.  Do not sit, drive, or stand in one place for more than 30 minutes at a time. Sitting or standing for long periods of time can put stress on your back.  Do not drive or use heavy machinery while taking prescription pain medicine.  Use proper lifting techniques. When you bend and lift, use positions that put less stress on your back: ? [place] your knees. ? Keep the load close to your body. ? Avoid twisting.  Exercise regularly as told by your health care provider. Exercising helps your back heal faster and helps prevent back injuries by keeping muscles strong and flexible.  Work with a physical therapist to make a safe exercise program, as recommended by your health care provider. Do any exercises as told by your physical therapist.   Lifestyle  Maintain a healthy weight. Extra weight puts stress  on your back and makes it difficult to have good posture.  Avoid activities or situations that make you feel anxious or stressed. Stress and anxiety increase muscle tension and can make back pain worse. Learn ways to manage anxiety and stress, such as through exercise. General instructions  Sleep on a firm mattress in a comfortable position. Try lying on your side with your knees slightly bent. If you lie on your back, put a pillow under your knees.  Follow your treatment plan as told by your health care provider. This may include: ? Cognitive or behavioral  therapy. ? Acupuncture or massage therapy. ? Meditation or yoga. Contact a health care provider if:  You have pain that is not relieved with rest or medicine.  You have increasing pain going down into your legs or buttocks.  Your pain does not improve after 2 weeks.  You have pain at night.  You lose weight without trying.  You have a fever or chills. Get help right away if:  You develop new bowel or bladder control problems.  You have unusual weakness or numbness in your arms or legs.  You develop nausea or vomiting.  You develop abdominal pain.  You feel faint. Summary  Acute back pain is sudden and usually short-lived.  Use proper lifting techniques. When you bend and lift, use positions that put less stress on your back.  Take over-the-counter and prescription medicines and apply heat or ice as directed by your health care provider. This information is not intended to replace advice given to you by your health care provider. Make sure you discuss any questions you have with your health care provider. Document Revised: 02/13/2020 Document Reviewed: 02/13/2020 Elsevier Patient Education  2021 [employer].

## 2020-07-27 NOTE — ED Notes (Signed)
Called 3x for triage. Eloped from waiting area.

## 2020-08-02 ENCOUNTER — Ambulatory Visit (HOSPITAL_COMMUNITY): Payer: Medicare HMO | Attending: Cardiology

## 2020-08-02 ENCOUNTER — Other Ambulatory Visit: Payer: Self-pay

## 2020-08-02 DIAGNOSIS — I517 Cardiomegaly: Secondary | ICD-10-CM

## 2020-08-02 DIAGNOSIS — N189 Chronic kidney disease, unspecified: Secondary | ICD-10-CM | POA: Diagnosis not present

## 2020-08-02 DIAGNOSIS — J45909 Unspecified asthma, uncomplicated: Secondary | ICD-10-CM | POA: Insufficient documentation

## 2020-08-02 DIAGNOSIS — I348 Other nonrheumatic mitral valve disorders: Secondary | ICD-10-CM

## 2020-08-02 DIAGNOSIS — E785 Hyperlipidemia, unspecified: Secondary | ICD-10-CM | POA: Diagnosis not present

## 2020-08-02 DIAGNOSIS — E1122 Type 2 diabetes mellitus with diabetic chronic kidney disease: Secondary | ICD-10-CM | POA: Diagnosis not present

## 2020-08-02 DIAGNOSIS — I479 Paroxysmal tachycardia, unspecified: Secondary | ICD-10-CM | POA: Diagnosis not present

## 2020-08-02 DIAGNOSIS — I129 Hypertensive chronic kidney disease with stage 1 through stage 4 chronic kidney disease, or unspecified chronic kidney disease: Secondary | ICD-10-CM | POA: Insufficient documentation

## 2020-08-02 LAB — ECHOCARDIOGRAM COMPLETE
Area-P 1/2: 2.32 cm2
S' Lateral: 2.3 cm

## 2020-08-04 DIAGNOSIS — I479 Paroxysmal tachycardia, unspecified: Secondary | ICD-10-CM | POA: Diagnosis not present

## 2020-08-09 ENCOUNTER — Encounter: Payer: Self-pay | Admitting: Cardiology

## 2020-08-09 ENCOUNTER — Other Ambulatory Visit: Payer: Self-pay

## 2020-08-09 ENCOUNTER — Ambulatory Visit: Payer: Medicare HMO | Admitting: Cardiology

## 2020-08-09 VITALS — BP 140/82 | HR 76 | Ht 66.0 in | Wt 227.8 lb

## 2020-08-09 DIAGNOSIS — I479 Paroxysmal tachycardia, unspecified: Secondary | ICD-10-CM | POA: Diagnosis not present

## 2020-08-09 DIAGNOSIS — I10 Essential (primary) hypertension: Secondary | ICD-10-CM

## 2020-08-09 DIAGNOSIS — Z712 Person consulting for explanation of examination or test findings: Secondary | ICD-10-CM | POA: Diagnosis not present

## 2020-08-09 DIAGNOSIS — E119 Type 2 diabetes mellitus without complications: Secondary | ICD-10-CM | POA: Diagnosis not present

## 2020-08-09 DIAGNOSIS — Z7189 Other specified counseling: Secondary | ICD-10-CM | POA: Diagnosis not present

## 2020-08-09 NOTE — Patient Instructions (Signed)
Medication Instructions:  Your Physician recommend you continue on your current medication as directed.    *If you need a refill on your cardiac medications before your next appointment, please call your pharmacy*   Lab Work: None   Testing/Procedures: None   Follow-Up: At [facility], you and your health needs are our priority.  As part of our continuing mission to provide you with exceptional heart care, we have created designated Provider Care Teams.  These Care Teams include your primary Cardiologist (physician) and Advanced Practice Providers (APPs -  Physician Assistants and Nurse Practitioners) who all work together to provide you with the care you need, when you need it.  We recommend signing up for the patient portal called "MyChart".  Sign up information is provided on this After Visit Summary.  MyChart is used to connect with patients for Virtual Visits (Telemedicine).  Patients are able to view lab/test results, encounter notes, upcoming appointments, etc.  Non-urgent messages can be sent to your provider as well.   To learn more about what you can do with MyChart, go to [email or web address].    Your next appointment:   2 year(s)  The format for your next appointment:   In Person  Provider:   Bridgette Christopher, MD     

## 2020-08-09 NOTE — Progress Notes (Signed)
Cardiology Office Note:    Date:  08/09/2020   ID:  Ernestina Columbia, DOB 02-11-1951, MRN [id number]  PCP:  Octavia Heir, NP  Cardiologist:  Jodelle Red, MD  Referring MD: Octavia Heir, NP   CC: follow up  History of Present Illness:    YOLINDA DUERR is a 70 y.o. female with a hx of hypertension, type II diabetes, hiatal hernia/GERD, chronic cough who is seen for follow up today. I initially met her 2722 as a new consult at the request of [place], Amy E, NP for the evaluation and management of tachycardia. Concern was for atrial fibrillation.  Today: Reviewed her heart monitor and echo results with her today. Has not had further events. Has an Apple watch with no alarms.   Has not required PRN diltiazem. Asking about stopping aspirin, discussed diabetes but she is having early bruising.  Doesn't check BP at home.   Denies chest pain, shortness of breath at rest or with normal exertion. No PND, orthopnea, LE edema or unexpected weight gain. No syncope or palpitations.  Past Medical History:  Diagnosis Date   Asthma    Bronchitis    Cataract    bilateral   Depression    Diabetes mellitus without complication (HCC)    GERD (gastroesophageal reflux disease)    Gout    Hyperlipidemia    Hypertension    Pneumonia    Restrictive airway disease    Retinal detachment    left    Past Surgical History:  Procedure Laterality Date   ANKLE SURGERY     BLADDER SURGERY     EYE SURGERY     MELANOMA EXCISION     Of Right Leg   TONSILLECTOMY     TUBAL LIGATION      Current Medications: Current Outpatient Medications on File Prior to Visit  Medication Sig   aspirin EC 81 MG tablet Take 1 tablet (81 mg total) by mouth daily. Swallow whole.   atorvastatin (LIPITOR) 10 MG tablet Take 10 mg by mouth daily.   chlorthalidone (HYGROTON) 50 MG tablet Take 50 mg by mouth daily.   Cholecalciferol (VITAMIN D3) 250 MCG (10000 UT) capsule Take 10,000 Units by mouth daily.    diltiazem (CARDIZEM) 30 MG tablet Take 1 tablet (30 mg total) by mouth 4 (four) times daily as needed.   glipiZIDE (GLUCOTROL XL) 5 MG 24 hr tablet Take 5 mg by mouth at bedtime.   metFORMIN (GLUCOPHAGE-XR) 500 MG 24 hr tablet TAKE TWO TABLETS BY MOUTH TWICE A DAY   montelukast (SINGULAIR) 10 MG tablet Take 10 mg by mouth at bedtime.   pantoprazole (PROTONIX) 40 MG tablet Take 40 mg by mouth 2 (two) times daily.   tiZANidine (ZANAFLEX) 4 MG tablet Take 1 tablet (4 mg total) by mouth daily as needed.   No current facility-administered medications on file prior to visit.     Allergies:   Procaine   Social History   Tobacco Use   Smoking status: Never Smoker   Smokeless tobacco: Never Used  Vaping Use   Vaping Use: Never used  Substance Use Topics   Alcohol use: Yes    Comment: Social Drinker   Drug use: Never    Family History: mom died age 24, had a long history of pulmonary disease, dad died at age 64 (dropped dead, unknown cause).  ROS:   Please see the history of present illness.  Additional pertinent ROS otherwise unremarkable.   EKGs/Labs/Other Studies  Reviewed:    The following studies were reviewed today: Echo 08/02/20 1. Left ventricular ejection fraction, by estimation, is 60 to 65%. The  left ventricle has normal function. The left ventricle has no regional  wall motion abnormalities. There is mild concentric left ventricular  hypertrophy. Left ventricular diastolic  parameters are consistent with Grade I diastolic dysfunction (impaired  relaxation). Elevated left ventricular end-diastolic pressure.   2. Right ventricular systolic function is normal. The right ventricular  size is normal.   3. The mitral valve is degenerative. No evidence of mitral valve  regurgitation. No evidence of mitral stenosis. Severe mitral annular  calcification.   4. The aortic valve is normal in structure. Aortic valve regurgitation is  not visualized. No aortic stenosis is present.    5. The inferior vena cava is normal in size with greater than 50%  respiratory variability, suggesting right atrial pressure of 3 mmHg.   Monitor 08/09/20 ~14 days of data recorded on Zio monitor. Patient had a min HR of 51 bpm, max HR of 164 bpm, and avg HR of 76 bpm. Predominant underlying rhythm was Sinus Rhythm. No VT, atrial fibrillation, high degree block, or pauses noted. I run of SVT lasting 13 beats. Isolated atrial and ventricular ectopy was rare (<1%). There were 4 triggered events. Three were sinus rhythm, one was sinus with occasional PVCs. No significant arrhythmias detected.   EKG:  EKG is personally reviewed.  The ekg ordered 07/12/20 demonstrates NSR at 65 bpm  Recent Labs: 06/29/2020: ALT 9; BUN 30; Creat 0.98; Potassium 4.4; Sodium 142; TSH 3.40 07/27/2020: Hemoglobin 12.2; Platelets 490  Recent Lipid Panel    Component Value Date/Time   CHOL 202 (H) 06/29/2020 0000   TRIG 141 06/29/2020 0000   HDL 63 06/29/2020 0000   CHOLHDL 3.2 06/29/2020 0000   LDLCALC 114 (H) 06/29/2020 0000    Physical Exam:    VS:  BP 140/82 (BP Location: Left Arm, Patient Position: Sitting)   Pulse 76   Ht $R'5\' 6"'Jg$  (1.676 m)   Wt 227 lb 12.8 oz (103.3 kg)   SpO2 97%   BMI 36.77 kg/m     Wt Readings from Last 3 Encounters:  08/09/20 227 lb 12.8 oz (103.3 kg)  07/27/20 230 lb 9.6 oz (104.6 kg)  07/12/20 225 lb (102.1 kg)    GEN: Well nourished, well developed in no acute distress HEENT: Normal, moist mucous membranes NECK: No JVD CARDIAC: regular rhythm, normal S1 and S2, no rubs or gallops. No murmur. VASCULAR: Radial and DP pulses 2+ bilaterally. No carotid bruits RESPIRATORY:  Clear to auscultation without rales, wheezing or rhonchi  ABDOMEN: Soft, non-tender, non-distended MUSCULOSKELETAL:  Ambulates independently SKIN: Warm and dry, no edema NEUROLOGIC:  Alert and oriented x 3. No focal neuro deficits noted. PSYCHIATRIC:  Normal affect    ASSESSMENT:    1. Paroxysmal  tachycardia ([place])   2. Essential hypertension   3. Type 2 diabetes mellitus without complication, without long-term current use of insulin (HCC)   4. Cardiac risk counseling   5. Counseling on health promotion and disease prevention   6. Encounter to discuss test results    PLAN:    Paroxysmal tachycardia, concerning for atrial fibrillation -CHA2DS2/VAS Stroke Risk Points=4; bleeds easily -echo largely normal, Zio monitor without evidence of afib -ordered for PRN diltiazem, has not required -discussed that if tachycardia returns, would like to be able to capture event. She understands this and will contact me if symptoms return. Will try  to capture ECG with her apple watch.  Eosinophilia: -following with PCP  Hypertension -slightly elevated today -on chlorthalidone -discussed home BP monitoring  Type II diabetes -on metformin, glipizide -last A1c 7.1  -on aspirin, atorvastatin for elevated CV risk. Discussed stopping aspirin due to easy bruising.  Cardiac risk counseling and prevention recommendations: -recommend heart healthy/Mediterranean diet, with whole grains, fruits, vegetable, fish, lean meats, nuts, and olive oil. Limit salt. -recommend moderate walking, 3-5 times/week for 30-50 minutes each session. Aim for at least 150 minutes.week. Goal should be pace of 3 miles/hours, or walking 1.5 miles in 30 minutes -recommend avoidance of tobacco products. Avoid excess alcohol. -ASCVD risk score: The 10-year ASCVD risk score Mikey Bussing DC Brooke Bonito., et al., 2013) is: 29%   Values used to calculate the score:     Age: 45 years     Sex: Female     Is Non-Hispanic African American: No     Diabetic: Yes     Tobacco smoker: No     Systolic Blood Pressure: 633 mmHg     Is BP treated: Yes     HDL Cholesterol: 63 mg/dL     Total Cholesterol: 202 mg/dL    Plan for follow up: 2 years or sooner as needed  Buford Dresser, MD, PhD, [facility]    Medication  Adjustments/Labs and Tests Ordered: Current medicines are reviewed at length with the patient today.  Concerns regarding medicines are outlined above.  No orders of the defined types were placed in this encounter.  No orders of the defined types were placed in this encounter.   Patient Instructions  Medication Instructions:  Your Physician recommend you continue on your current medication as directed.    *If you need a refill on your cardiac medications before your next appointment, please call your pharmacy*   Lab Work: None  Testing/Procedures: None   Follow-Up: At [facility], you and your health needs are our priority.  As part of our continuing mission to provide you with exceptional heart care, we have created designated Provider Care Teams.  These Care Teams include your primary Cardiologist (physician) and Advanced Practice Providers (APPs -  Physician Assistants and Nurse Practitioners) who all work together to provide you with the care you need, when you need it.  We recommend signing up for the patient portal called "MyChart".  Sign up information is provided on this After Visit Summary.  MyChart is used to connect with patients for Virtual Visits (Telemedicine).  Patients are able to view lab/test results, encounter notes, upcoming appointments, etc.  Non-urgent messages can be sent to your provider as well.   To learn more about what you can do with MyChart, go to [email or web address].    Your next appointment:   2 year(s)  The format for your next appointment:   In Person  Provider:   Buford Dresser, MD   Signed, Buford Dresser, MD PhD 08/09/2020     [place]

## 2020-08-13 ENCOUNTER — Encounter: Payer: Self-pay | Admitting: Orthopedic Surgery

## 2020-08-13 ENCOUNTER — Ambulatory Visit (INDEPENDENT_AMBULATORY_CARE_PROVIDER_SITE_OTHER): Payer: Medicare HMO | Admitting: Orthopedic Surgery

## 2020-08-13 ENCOUNTER — Other Ambulatory Visit: Payer: Self-pay

## 2020-08-13 ENCOUNTER — Ambulatory Visit
Admission: RE | Admit: 2020-08-13 | Discharge: 2020-08-13 | Disposition: A | Discharge status: Home / Self Care | Payer: Medicare HMO | Source: Ambulatory Visit | Attending: Orthopedic Surgery | Admitting: Orthopedic Surgery

## 2020-08-13 VITALS — BP 158/78 | HR 91 | Temp 98.4°F | Resp 20 | Ht 66.0 in | Wt 228.4 lb

## 2020-08-13 DIAGNOSIS — M25552 Pain in left hip: Secondary | ICD-10-CM

## 2020-08-13 DIAGNOSIS — M533 Sacrococcygeal disorders, not elsewhere classified: Secondary | ICD-10-CM | POA: Diagnosis not present

## 2020-08-13 DIAGNOSIS — M5136 Other intervertebral disc degeneration, lumbar region: Secondary | ICD-10-CM | POA: Diagnosis not present

## 2020-08-13 MED ORDER — GABAPENTIN 100 MG PO CAPS: 100.0000 mg | ORAL_CAPSULE | Freq: Every day | ORAL | 1 refills | Status: DC

## 2020-08-13 MED ORDER — PREDNISONE 10 MG PO TABS: ORAL_TABLET | ORAL | 0 refills | Status: AC

## 2020-08-13 NOTE — Progress Notes (Addendum)
Careteam: Patient Care Team: Yvonna Alanis, NP as PCP - General (Adult Health Nurse Practitioner) Buford Dresser, MD as PCP - Cardiology (Cardiology)  Seen by: Windell Moulding, AGNP-C  PLACE OF SERVICE:  [place] Directive information Does Patient Have a Medical Advance Directive?: Yes (Will bring copy in), Type of Advance Directive: Living will;Healthcare Power of Attorney, Does patient want to make changes to medical advance directive?: No - Patient declined  Allergies  Allergen Reactions  . Procaine Other (See Comments) and Palpitations    ALSO BLISTERS WITH TOPICAL Novacaine   Novocain. ALSO BLISTERS WITH TOPICAL    Chief Complaint  Patient presents with  . Acute Visit    Joint Pain starts in lower back and down left leg     HPI: Patient is a 70 y.o. female seen today for acute visit for left leg pain.   Last seen about 3 weeks ago, she claims the her leg and back pain never subsided. Prednisone dose pack helped for a few days. Left leg pain rated 5/10. Radiates down leg. She now reports intermittent groin pain and left hip lateral pain. Pain stimulated with prolonged sitting, relieved with standing. She has been taking 650 mg tylenol prn for pain. Also taking robaxin at night.  Requesting additional medicine for pain.   Denies any recent falls or injuries.   Blood pressure rechecked,  elevated due to pain.   Review of Systems:  Review of Systems  Constitutional: Negative for fever and malaise/fatigue.  Respiratory: Negative for cough and shortness of breath.   Cardiovascular: Negative for chest pain and leg swelling.  Musculoskeletal: Positive for joint pain and myalgias. Negative for falls.       Left hip and leg pain  Psychiatric/Behavioral: Negative for depression. The patient is not nervous/anxious.     Past Medical History:  Diagnosis Date  . Asthma   . Bronchitis   . Cataract    bilateral  . Depression   . Diabetes mellitus without  complication ([place])   . GERD (gastroesophageal reflux disease)   . Gout   . Hyperlipidemia   . Hypertension   . Pneumonia   . Restrictive airway disease   . Retinal detachment    left   Past Surgical History:  Procedure Laterality Date  . ANKLE SURGERY    . BLADDER SURGERY    . EYE SURGERY    . MELANOMA EXCISION     Of Right Leg  . TONSILLECTOMY    . TUBAL LIGATION     Social History:   reports that she has never smoked. She has never used smokeless tobacco. She reports current alcohol use. She reports that she does not use drugs.  History reviewed. No pertinent family history.  Medications: Patient's Medications  New Prescriptions   No medications on file  Previous Medications   ATORVASTATIN (LIPITOR) 10 MG TABLET    Take 10 mg by mouth daily.   CHLORTHALIDONE (HYGROTON) 50 MG TABLET    Take 50 mg by mouth daily.   CHOLECALCIFEROL (VITAMIN D3) 250 MCG (10000 UT) CAPSULE    Take 10,000 Units by mouth daily.   GLIPIZIDE (GLUCOTROL XL) 5 MG 24 HR TABLET    Take 5 mg by mouth at bedtime.   METFORMIN (GLUCOPHAGE-XR) 500 MG 24 HR TABLET    TAKE TWO TABLETS BY MOUTH TWICE A DAY   MONTELUKAST (SINGULAIR) 10 MG TABLET    Take 10 mg by mouth at bedtime.   PANTOPRAZOLE (  PROTONIX) 40 MG TABLET    Take 40 mg by mouth 2 (two) times daily.   TIZANIDINE (ZANAFLEX) 4 MG TABLET    Take 1 tablet (4 mg total) by mouth daily as needed.  Modified Medications   No medications on file  Discontinued Medications   ASPIRIN EC 81 MG TABLET    Take 1 tablet (81 mg total) by mouth daily. Swallow whole.   DILTIAZEM (CARDIZEM) 30 MG TABLET    Take 1 tablet (30 mg total) by mouth 4 (four) times daily as needed.    Physical Exam:  Vitals:   08/13/20 1402  BP: (!) 160/90  Pulse: 91  Resp: 20  Temp: 98.4 F (36.9 C)  TempSrc: Temporal  SpO2: 97%  Weight: 228 lb 6.4 oz (103.6 kg)  Height: 5\' 6"  (1.676 m)   Body mass index is 36.86 kg/m. Wt Readings from Last 3 Encounters:  08/13/20 228 lb  6.4 oz (103.6 kg)  08/09/20 227 lb 12.8 oz (103.3 kg)  07/27/20 230 lb 9.6 oz (104.6 kg)    Physical Exam Vitals reviewed.  Constitutional:      General: She is not in acute distress. HENT:     Head: Normocephalic.  Cardiovascular:     Rate and Rhythm: Normal rate and regular rhythm.     Pulses: Normal pulses.     Heart sounds: Normal heart sounds. No murmur heard.   Musculoskeletal:        General: Tenderness present. No deformity.     Right lower leg: No edema.     Left lower leg: No edema.     Comments: Tenderness palpated over left lateral hip and sciatica. Left dorsiflexion 5/5. Gait abnormal.   Skin:    General: Skin is warm and dry.  Neurological:     General: No focal deficit present.     Mental Status: She is alert and oriented to person, place, and time.     Gait: Gait abnormal.  Psychiatric:        Mood and Affect: Mood normal.        Behavior: Behavior normal.    Labs reviewed: Basic Metabolic Panel: Recent Labs    06/29/20 0000  NA 142  K 4.4  CL 104  CO2 29  GLUCOSE 91  BUN 30*  CREATININE 0.98*  CALCIUM 9.7  TSH 3.40   Liver Function Tests: Recent Labs    06/29/20 0000  AST 11  ALT 9  BILITOT 0.3  PROT 6.6   No results for input(s): LIPASE, AMYLASE in the last 8760 hours. No results for input(s): AMMONIA in the last 8760 hours. CBC: Recent Labs    06/29/20 0000 07/27/20 1022  WBC 7.3 9.5  NEUTROABS 3,584 5,900  HGB 11.5* 12.2  HCT 35.8 36.5  MCV 84.8 84.5  PLT 476* 490*   Lipid Panel: Recent Labs    06/29/20 0000  CHOL 202*  HDL 63  LDLCALC 114*  TRIG 141  CHOLHDL 3.2   TSH: Recent Labs    06/29/20 0000  TSH 3.40   A1C: Lab Results  Component Value Date   HGBA1C 7.1 (H) 06/29/2020     Assessment/Plan 1. Hip pain, acute, left - left leg pain has progressed to left hip - concerns for left hip OA - will add prednisone and gabapentin for pain - x-ray left hip to r/o OA or deformity - may continue tylenol  and robaxin prn for pain - advised to see PCP if pain worsens -  predniSONE (DELTASONE) 10 MG tablet; Take 4 tablets (40 mg total) by mouth daily with breakfast for 1 day, THEN 3 tablets (30 mg total) daily with breakfast for 1 day, THEN 2 tablets (20 mg total) daily with breakfast for 1 day, THEN 1 tablet (10 mg total) daily with breakfast for 1 day, THEN 0.5 tablets (5 mg total) daily with breakfast for 1 day.  Dispense: 10.5 tablet; Refill: 0 - gabapentin (NEURONTIN) 100 MG capsule; Take 1 capsule (100 mg total) by mouth at bedtime.  Dispense: 30 capsule; Refill: 1 - DG Hip Unilat W OR W/O Pelvis 2-3 Views Left; Future  I provided 22 minutes of face-to-face time during this encounter.    Next appt: Visit date not found [place], [facility] [phone]

## 2020-08-13 NOTE — Patient Instructions (Signed)
Please go x-ray left hip within next week Will restart prednisone dose pack for 5 days Will start gabapentin every night for pain Continue tylenol 1000 mg twice a day for pain  Hip Pain The hip is the joint between the upper legs and the lower pelvis. The bones, cartilage, tendons, and muscles of your hip joint support your body and allow you to move around. Hip pain can range from a minor ache to severe pain in one or both of your hips. The pain may be felt on the inside of the hip joint near the groin, or on the outside near the buttocks and upper thigh. You may also have swelling or stiffness in your hip area. Follow these instructions at home: Managing pain, stiffness, and swelling  If directed, put ice on the painful area. To do this: ? Put ice in a plastic bag. ? Place a towel between your skin and the bag. ? Leave the ice on for 20 minutes, 2-3 times a day.  If directed, apply heat to the affected area as often as told by your health care provider. Use the heat source that your health care provider recommends, such as a moist heat pack or a heating pad. ? Place a towel between your skin and the heat source. ? Leave the heat on for 20-30 minutes. ? Remove the heat if your skin turns bright red. This is especially important if you are unable to feel pain, heat, or cold. You may have a greater risk of getting burned.      Activity  Do exercises as told by your health care provider.  Avoid activities that cause pain. General instructions  Take over-the-counter and prescription medicines only as told by your health care provider.  Keep a journal of your symptoms. Write down: ? How often you have hip pain. ? The location of your pain. ? What the pain feels like. ? What makes the pain worse.  Sleep with a pillow between your legs on your most comfortable side.  Keep all follow-up visits as told by your health care provider. This is important.   Contact a health care provider  if:  You cannot put weight on your leg.  Your pain or swelling continues or gets worse after one week.  It gets harder to walk.  You have a fever. Get help right away if:  You fall.  You have a sudden increase in pain and swelling in your hip.  Your hip is red or swollen or very tender to touch. Summary  Hip pain can range from a minor ache to severe pain in one or both of your hips.  The pain may be felt on the inside of the hip joint near the groin, or on the outside near the buttocks and upper thigh.  Avoid activities that cause pain.  Write down how often you have hip pain, the location of the pain, what makes it worse, and what it feels like. This information is not intended to replace advice given to you by your health care provider. Make sure you discuss any questions you have with your health care provider. Document Revised: 10/07/2018 Document Reviewed: 10/07/2018 Elsevier Patient Education  [place].

## 2020-08-16 ENCOUNTER — Other Ambulatory Visit: Payer: Self-pay | Admitting: Orthopedic Surgery

## 2020-08-16 DIAGNOSIS — M5442 Lumbago with sciatica, left side: Secondary | ICD-10-CM

## 2020-08-16 DIAGNOSIS — M5441 Lumbago with sciatica, right side: Secondary | ICD-10-CM

## 2020-08-30 ENCOUNTER — Other Ambulatory Visit: Payer: Self-pay

## 2020-08-30 ENCOUNTER — Encounter: Payer: Self-pay | Admitting: Family

## 2020-08-30 ENCOUNTER — Ambulatory Visit (INDEPENDENT_AMBULATORY_CARE_PROVIDER_SITE_OTHER): Payer: Medicare HMO | Admitting: Family

## 2020-08-30 VITALS — BP 160/98 | HR 82 | Temp 97.8°F | Resp 18 | Ht 66.0 in | Wt 230.4 lb

## 2020-08-30 DIAGNOSIS — I10 Essential (primary) hypertension: Secondary | ICD-10-CM | POA: Diagnosis not present

## 2020-08-30 DIAGNOSIS — M5441 Lumbago with sciatica, right side: Secondary | ICD-10-CM

## 2020-08-30 DIAGNOSIS — M25552 Pain in left hip: Secondary | ICD-10-CM

## 2020-08-30 DIAGNOSIS — M5442 Lumbago with sciatica, left side: Secondary | ICD-10-CM

## 2020-08-30 MED ORDER — AMLODIPINE BESYLATE 5 MG PO TABS: 5.0000 mg | ORAL_TABLET | Freq: Every day | ORAL | 0 refills | Status: DC

## 2020-08-30 MED ORDER — CLONIDINE HCL 0.1 MG PO TABS
0.1000 mg | ORAL_TABLET | Freq: Once | ORAL | Status: AC
  Administered 2020-08-30: 0.1 mg via ORAL

## 2020-08-30 MED ORDER — GABAPENTIN 100 MG PO CAPS: 100.0000 mg | ORAL_CAPSULE | Freq: Two times a day (BID) | ORAL | 1 refills | Status: DC

## 2020-08-30 NOTE — Progress Notes (Signed)
Provider: Nethan Caudillo FNP-C  Yvonna Alanis, NP  Patient Care Team: Yvonna Alanis, NP as PCP - General (Adult Health Nurse Practitioner) Buford Dresser, MD as PCP - Cardiology (Cardiology)  Extended Emergency Contact Information Primary Emergency Contact: [facility] Address: [address]          [place],  [address] Johnnette Litter of [place] Phone: [phone] Mobile Phone: [phone] Relation: Son  Code Status:  Full Code  Goals of care: Advanced Directive information Advanced Directives 08/30/2020  Does Patient Have a Medical Advance Directive? Yes  Type of Advance Directive Living will;Out of facility DNR (pink MOST or yellow form)  Does patient want to make changes to medical advance directive? No - Patient declined  Copy of [place] in Chart? Yes - validated most recent copy scanned in chart (See row information)  Would patient like information on creating a medical advance directive? -     Chief Complaint  Patient presents with  . Acute Visit    Complains of back pain.    HPI:  Pt is a 70 y.o. female seen today for an acute visit for evaluation of ongoing lower back  and hip pain.she was here 08/13/2020 seen by PCP Amy Fargo,NP for back pain that radiated to left leg pain she was prescribed Prednisone tapered dose which she said seem to have helped with the pain but has pain radiating to right leg hip too.of note she was on prednisone taper 3 weeks prior to being seen 08/13/2020. Rates pain 5/10 on scale worst with sitting but improves with standing. She request another round of prednisone states will be travelling over the weekend to [place] with family does not want to be in pain during the trip.she was also prescribed Gabapentin 100 mg tablet at bedtime but states this did not help with the pain so had spoken with PCP Cleophas Dunker who advised to take 2 tablet of Gabapentin but now has run out.Gabpentin helped with her pain at night.   She denies any weakness,numbness or loss bowel or bladder control.  Hip W/W/o pelvis x ray done on 08/13/2020 reviewed and discussed with patient.X-ray done 08/13/2020  showed  mild degenerative sclerosis of left sacroiliac joint and right DDD at L4 -L5 but no fracture or dislocation.  Blood pressure readings high this visit possible due to pain.recalls B/p at home in the 130's/70's sometimes.Previous B.p here was 158/78 still not at goal. She denies any headache,dizziness,chest tightness,chest pain pr shortness of breath.    Past Medical History:  Diagnosis Date  . Asthma   . Bronchitis   . Cataract    bilateral  . Depression   . Diabetes mellitus without complication ([place])   . GERD (gastroesophageal reflux disease)   . Gout   . Hyperlipidemia   . Hypertension   . Pneumonia   . Restrictive airway disease   . Retinal detachment    left   Past Surgical History:  Procedure Laterality Date  . ANKLE SURGERY    . BLADDER SURGERY    . EYE SURGERY    . MELANOMA EXCISION     Of Right Leg  . TONSILLECTOMY    . TUBAL LIGATION      Allergies  Allergen Reactions  . Procaine Other (See Comments) and Palpitations    ALSO BLISTERS WITH TOPICAL Novacaine   Novocain. ALSO BLISTERS WITH TOPICAL    Outpatient Encounter Medications as of 08/30/2020  Medication Sig  . atorvastatin (LIPITOR) 10 MG tablet Take  10 mg by mouth daily.  . chlorthalidone (HYGROTON) 50 MG tablet Take 50 mg by mouth daily.  . Cholecalciferol (VITAMIN D3) 250 MCG (10000 UT) capsule Take 10,000 Units by mouth daily.  Marland Kitchen gabapentin (NEURONTIN) 100 MG capsule Take 1 capsule (100 mg total) by mouth at bedtime.  Marland Kitchen glipiZIDE (GLUCOTROL XL) 5 MG 24 hr tablet Take 5 mg by mouth at bedtime.  . metFORMIN (GLUCOPHAGE-XR) 500 MG 24 hr tablet TAKE TWO TABLETS BY MOUTH TWICE A DAY  . montelukast (SINGULAIR) 10 MG tablet Take 10 mg by mouth at bedtime.  . pantoprazole (PROTONIX) 40 MG tablet Take 40 mg by mouth 2 (two) times  daily.  Marland Kitchen tiZANidine (ZANAFLEX) 4 MG tablet Take 1 tablet (4 mg total) by mouth daily as needed.   No facility-administered encounter medications on file as of 08/30/2020.    Review of Systems  Constitutional: Negative for appetite change, chills, fatigue and fever.  Respiratory: Negative for cough, chest tightness, shortness of breath and wheezing.   Cardiovascular: Negative for chest pain, palpitations and leg swelling.  Gastrointestinal: Negative for abdominal distention, abdominal pain, constipation, diarrhea, nausea and vomiting.  Genitourinary: Negative for difficulty urinating, dysuria, flank pain, frequency and urgency.  Musculoskeletal: Positive for arthralgias and back pain. Negative for gait problem and joint swelling.       Pain on both hips and lower back   Neurological: Negative for weakness and numbness.    Immunization History  Administered Date(s) Administered  . Influenza Split 03/10/2011, 03/28/2012, 03/03/2014, 04/02/2015  . Influenza, High Dose Seasonal PF 03/28/2019, 03/17/2020  . Influenza,inj,Quad PF,6-35 Mos 03/19/2018  . Influenza,trivalent, recombinat, inj, PF 03/03/2014  . Influenza-Unspecified 03/10/2011, 03/28/2012  . Moderna Sars-Covid-2 Vaccination 07/01/2019, 07/29/2019, 04/12/2020  . Tdap 10/28/2014   Pertinent  Health Maintenance Due  Topic Date Due  . FOOT EXAM  Never done  . OPHTHALMOLOGY EXAM  Never done  . COLONOSCOPY (Pts 45-44yrs Insurance coverage will need to be confirmed)  Never done  . MAMMOGRAM  Never done  . DEXA SCAN  Never done  . PNA vac Low Risk Adult (1 of 2 - PCV13) Never done  . HEMOGLOBIN A1C  12/27/2020  . URINE MICROALBUMIN  06/29/2021  . INFLUENZA VACCINE  Completed   Fall Risk  08/30/2020 07/27/2020  Falls in the past year? 0 0  Number falls in past yr: 0 0  Injury with Fall? 0 0   Functional Status Survey:    Vitals:   08/30/20 1303  BP: (!) 180/102  Pulse: 82  Resp: 18  Temp: 97.8 F (36.6 C)  SpO2: 98%   Weight: 230 lb 6.4 oz (104.5 kg)  Height: 5\' 6"  (1.676 m)   Body mass index is 37.19 kg/m. Physical Exam Vitals reviewed.  Constitutional:      General: She is not in acute distress.    Appearance: She is not ill-appearing.  HENT:     Head: Normocephalic.  Eyes:     General: No scleral icterus.       Right eye: No discharge.        Left eye: No discharge.     Conjunctiva/sclera: Conjunctivae normal.     Pupils: Pupils are equal, round, and reactive to light.  Cardiovascular:     Rate and Rhythm: Normal rate and regular rhythm.     Pulses: Normal pulses.     Heart sounds: Normal heart sounds. No murmur heard. No friction rub. No gallop.   Pulmonary:  Effort: Pulmonary effort is normal. No respiratory distress.     Breath sounds: Normal breath sounds. No wheezing, rhonchi or rales.  Chest:     Chest wall: No tenderness.  Abdominal:     General: Bowel sounds are normal. There is no distension.     Palpations: Abdomen is soft. There is no mass.     Tenderness: There is no abdominal tenderness. There is no right CVA tenderness, left CVA tenderness, guarding or rebound.  Musculoskeletal:     Right lower leg: No edema.     Left lower leg: No edema.       Legs:     Comments: Limited ROM to left leg raise due to pain on anterior thigh area.Right hip tender to palpation   Skin:    General: Skin is warm.     Coloration: Skin is not pale.     Findings: No erythema.  Neurological:     Mental Status: She is alert and oriented to person, place, and time.     Cranial Nerves: No cranial nerve deficit.     Motor: No weakness.     Gait: Gait normal.  Psychiatric:        Mood and Affect: Mood normal.        Behavior: Behavior normal.        Thought Content: Thought content normal.        Judgment: Judgment normal.     Labs reviewed: Recent Labs    06/29/20 0000  NA 142  K 4.4  CL 104  CO2 29  GLUCOSE 91  BUN 30*  CREATININE 0.98*  CALCIUM 9.7   Recent Labs     06/29/20 0000  AST 11  ALT 9  BILITOT 0.3  PROT 6.6   Recent Labs    06/29/20 0000 07/27/20 1022  WBC 7.3 9.5  NEUTROABS 3,584 5,900  HGB 11.5* 12.2  HCT 35.8 36.5  MCV 84.8 84.5  PLT 476* 490*   Lab Results  Component Value Date   TSH 3.40 06/29/2020   Lab Results  Component Value Date   HGBA1C 7.1 (H) 06/29/2020   Lab Results  Component Value Date   CHOL 202 (H) 06/29/2020   HDL 63 06/29/2020   LDLCALC 114 (H) 06/29/2020   TRIG 141 06/29/2020   CHOLHDL 3.2 06/29/2020    Significant Diagnostic Results in last 30 days:  ECHOCARDIOGRAM COMPLETE  Result Date: 08/02/2020    ECHOCARDIOGRAM REPORT   Patient Name:   MAKAYLI BRACKEN Date of Exam: 08/02/2020 Medical Rec #:  [id number]       Height:       65.0 in Accession #:    [id number]      Weight:       230.6 lb Date of Birth:  1950-12-24       BSA:          2.102 m Patient Age:    70 years        BP:           140/76 mmHg Patient Gender: F               HR:           67 bpm. Exam Location:  [facility] Procedure: 2D Echo, Cardiac Doppler and Color Doppler Indications:    I47.9 Paroxysmal tachycardia  History:        Patient has no prior history of Echocardiogram examinations.  Risk Factors:Hypertension, Diabetes and Dyslipidemia. Morbid                 obesity. Chronic kidney disease. Asthma.  Sonographer:    Diamond Nickel RCS Referring Phys: [phone] [place]  1. Left ventricular ejection fraction, by estimation, is 60 to 65%. The left ventricle has normal function. The left ventricle has no regional wall motion abnormalities. There is mild concentric left ventricular hypertrophy. Left ventricular diastolic parameters are consistent with Grade I diastolic dysfunction (impaired relaxation). Elevated left ventricular end-diastolic pressure.  2. Right ventricular systolic function is normal. The right ventricular size is normal.  3. The mitral valve is degenerative. No evidence of mitral valve  regurgitation. No evidence of mitral stenosis. Severe mitral annular calcification.  4. The aortic valve is normal in structure. Aortic valve regurgitation is not visualized. No aortic stenosis is present.  5. The inferior vena cava is normal in size with greater than 50% respiratory variability, suggesting right atrial pressure of 3 mmHg. FINDINGS  Left Ventricle: Left ventricular ejection fraction, by estimation, is 60 to 65%. The left ventricle has normal function. The left ventricle has no regional wall motion abnormalities. The left ventricular internal cavity size was normal in size. There is  mild concentric left ventricular hypertrophy. Left ventricular diastolic parameters are consistent with Grade I diastolic dysfunction (impaired relaxation). Elevated left ventricular end-diastolic pressure. Right Ventricle: The right ventricular size is normal. No increase in right ventricular wall thickness. Right ventricular systolic function is normal. Left Atrium: Left atrial size was normal in size. Right Atrium: Right atrial size was normal in size. Pericardium: There is no evidence of pericardial effusion. Mitral Valve: The mitral valve is degenerative in appearance. There is moderate thickening of the anterior mitral valve leaflet(s). There is moderate calcification of the anterior mitral valve leaflet(s). Severe mitral annular calcification. No evidence of mitral valve regurgitation. No evidence of mitral valve stenosis. Tricuspid Valve: The tricuspid valve is normal in structure. Tricuspid valve regurgitation is trivial. No evidence of tricuspid stenosis. Aortic Valve: The aortic valve is normal in structure. Aortic valve regurgitation is not visualized. No aortic stenosis is present. Pulmonic Valve: The pulmonic valve was normal in structure. Pulmonic valve regurgitation is not visualized. No evidence of pulmonic stenosis. Aorta: The aortic root is normal in size and structure. Venous: The inferior vena cava  is normal in size with greater than 50% respiratory variability, suggesting right atrial pressure of 3 mmHg. IAS/Shunts: No atrial level shunt detected by color flow Doppler.  LEFT VENTRICLE PLAX 2D LVIDd:         4.20 cm  Diastology LVIDs:         2.30 cm  LV e' medial:    4.79 cm/s LV PW:         1.20 cm  LV E/e' medial:  17.9 LV IVS:        1.20 cm  LV e' lateral:   5.33 cm/s LVOT diam:     1.90 cm  LV E/e' lateral: 16.1 LV SV:         73 LV SV Index:   35 LVOT Area:     2.84 cm  RIGHT VENTRICLE RV Basal diam:  2.70 cm RV S prime:     10.90 cm/s TAPSE (M-mode): 2.4 cm RVSP:           23.6 mmHg LEFT ATRIUM             Index  RIGHT ATRIUM           Index LA diam:        3.30 cm 1.57 cm/m  RA Pressure: 3.00 mmHg LA Vol (A2C):   59.7 ml 28.41 ml/m RA Area:     14.70 cm LA Vol (A4C):   20.2 ml 9.61 ml/m  RA Volume:   36.40 ml  17.32 ml/m LA Biplane Vol: 36.1 ml 17.18 ml/m  AORTIC VALVE LVOT Vmax:   106.00 cm/s LVOT Vmean:  62.000 cm/s LVOT VTI:    0.259 m  AORTA Ao Root diam: 3.40 cm MITRAL VALVE                TRICUSPID VALVE MV Area (PHT): 2.32 cm     TR Peak grad:   20.6 mmHg MV Decel Time: 327 msec     TR Vmax:        227.00 cm/s MV E velocity: 85.70 cm/s   Estimated RAP:  3.00 mmHg MV A velocity: 138.00 cm/s  RVSP:           23.6 mmHg MV E/A ratio:  0.62                             SHUNTS                             Systemic VTI:  0.26 m                             Systemic Diam: 1.90 cm Fransico Him MD Electronically signed by Fransico Him MD Signature Date/Time: 08/02/2020/3:10:10 PM    Final    DG Hip Unilat W OR W/O Pelvis 2-3 Views Left  Result Date: 08/16/2020 CLINICAL DATA:  Left hip and low back pain without trauma EXAM: DG HIP (WITH OR WITHOUT PELVIS) 2-3V LEFT COMPARISON:  None. FINDINGS: No acute fracture or dislocation. No focal osseous lesion. Mild degenerative sclerosis of the left sacroiliac joint. Eccentric right degenerative disc disease at L4-5. IMPRESSION: No acute osseous  abnormality. Electronically Signed   By: Abigail Miyamoto M.D.   On: 08/16/2020 08:23   LONG TERM MONITOR (3-14 DAYS)  Result Date: 08/09/2020 ~14 days of data recorded on Zio monitor. Patient had a min HR of 51 bpm, max HR of 164 bpm, and avg HR of 76 bpm. Predominant underlying rhythm was Sinus Rhythm. No VT, atrial fibrillation, high degree block, or pauses noted. I run of SVT lasting 13 beats. Isolated atrial and ventricular ectopy was rare (<1%). There were 4 triggered events. Three were sinus rhythm, one was sinus with occasional PVCs. No significant arrhythmias detected.    Assessment/Plan  1. Hip pain, acute, left Worsening pain on both hips mild degenerative sclerosis of left sacroiliac joint noted 08/13/2020  - adjust Gabapentin as below.will avoid prednisone since she has completed two rounds of tapered prednisone.discussed urgent referral to Orthopedic for evaluation and possible cortisol injection.  - gabapentin (NEURONTIN) 100 MG capsule; Take 1 capsule (100 mg total) by mouth 2 (two) times daily.  Dispense: 60 capsule; Refill: 1 - Ambulatory referral to Orthopedic Surgery  2. Acute midline low back pain with bilateral sciatica X-ray results indicated right DDD at L4 -L5  Gabapentin as above.Referral to Orthopedic for evaluation of back pain.  - Ambulatory referral to Orthopedic Surgery  3. Essential (primary) hypertension  B/p elevated today and previous visit.clonidine 0.1 mg tablet given B/p slightly improved.pain could be contributing though seemed comfort during visit. Will start on amlodipine as below.side effects discussed.Advised to notify provider for any leg swelling.Additional education information on Amlodipine provided on AVS  - amLODipine (NORVASC) 5 MG tablet; Take 1 tablet (5 mg total) by mouth daily.  Dispense: 30 tablet; Refill: 0 - cloNIDine (CATAPRES) tablet 0.1 mg - advised to follow up in 2 weeks for B/p evaluation  Family/ staff Communication: Reviewed plan of  care with patient verbalized understanding   Labs/tests ordered: None   Next Appointment: follow up in 2 weeks for B/p evaluation   Sandrea Hughs, NP

## 2020-08-30 NOTE — Patient Instructions (Addendum)
- Urgent Referral to orthopedic specialist ordered today for lower back and hip pain   - Take amlodipine 5 mg tablet one by mouth daily for high blood pressure   - check your blood pressure daily at home and record on log.Notify provider's office if B/p > 140/90 bring log to for follow up visit for evaluation    [email or web address]">  DASH Eating Plan DASH stands for Dietary Approaches to Stop Hypertension. The DASH eating plan is a healthy eating plan that has been shown to:  Reduce high blood pressure (hypertension).  Reduce your risk for type 2 diabetes, heart disease, and stroke.  Help with weight loss. What are tips for following this plan? Reading food labels  Check food labels for the amount of salt (sodium) per serving. Choose foods with less than 5 percent of the Daily Value of sodium. Generally, foods with less than 300 milligrams (mg) of sodium per serving fit into this eating plan.  To find whole grains, look for the word "whole" as the first word in the ingredient list. Shopping  Buy products labeled as "low-sodium" or "no salt added."  Buy fresh foods. Avoid canned foods and pre-made or frozen meals. Cooking  Avoid adding salt when cooking. Use salt-free seasonings or herbs instead of table salt or sea salt. Check with your health care provider or pharmacist before using salt substitutes.  Do not fry foods. Cook foods using healthy methods such as baking, boiling, grilling, roasting, and broiling instead.  Cook with heart-healthy oils, such as olive, canola, avocado, soybean, or sunflower oil. Meal planning  Eat a balanced diet that includes: ? 4 or more servings of fruits and 4 or more servings of vegetables each day. Try to fill one-half of your plate with fruits and vegetables. ? 6-8 servings of whole grains each day. ? Less than 6 oz (170 g) of lean meat, poultry, or fish each day. A 3-oz (85-g) serving of meat is  about the same size as a deck of cards. One egg equals 1 oz (28 g). ? 2-3 servings of low-fat dairy each day. One serving is 1 cup (237 mL). ? 1 serving of nuts, seeds, or beans 5 times each week. ? 2-3 servings of heart-healthy fats. Healthy fats called omega-3 fatty acids are found in foods such as walnuts, flaxseeds, fortified milks, and eggs. These fats are also found in cold-water fish, such as sardines, salmon, and mackerel.  Limit how much you eat of: ? Canned or prepackaged foods. ? Food that is high in trans fat, such as some fried foods. ? Food that is high in saturated fat, such as fatty meat. ? Desserts and other sweets, sugary drinks, and other foods with added sugar. ? Full-fat dairy products.  Do not salt foods before eating.  Do not eat more than 4 egg yolks a week.  Try to eat at least 2 vegetarian meals a week.  Eat more home-cooked food and less restaurant, buffet, and fast food.   Lifestyle  When eating at a restaurant, ask that your food be prepared with less salt or no salt, if possible.  If you drink alcohol: ? Limit how much you use to:  0-1 drink a day for women who are not pregnant.  0-2 drinks a day for men. ? Be aware of how much alcohol is in your drink. In the [place] one drink equals one 12 oz bottle of beer (355 mL), one 5 oz glass of wine (148 mL), or one  1 oz glass of hard liquor (44 mL). General information  Avoid eating more than 2,300 mg of salt a day. If you have hypertension, you may need to reduce your sodium intake to 1,500 mg a day.  Work with your health care provider to maintain a healthy body weight or to lose weight. Ask what an ideal weight is for you.  Get at least 30 minutes of exercise that causes your heart to beat faster (aerobic exercise) most days of the week. Activities may include walking, swimming, or biking.  Work with your health care provider or dietitian to adjust your eating plan to your individual calorie needs. What  foods should I eat? Fruits All fresh, dried, or frozen fruit. Canned fruit in natural juice (without added sugar). Vegetables Fresh or frozen vegetables (raw, steamed, roasted, or grilled). Low-sodium or reduced-sodium tomato and vegetable juice. Low-sodium or reduced-sodium tomato sauce and tomato paste. Low-sodium or reduced-sodium canned vegetables. Grains Whole-grain or whole-wheat bread. Whole-grain or whole-wheat pasta. Brown rice. [place]. Bulgur. Whole-grain and low-sodium cereals. Pita bread. Low-fat, low-sodium crackers. Whole-wheat flour tortillas. Meats and other proteins Skinless chicken or [place]. Ground chicken or [place]. Pork with fat trimmed off. Fish and seafood. Egg whites. Dried beans, peas, or lentils. Unsalted nuts, nut butters, and seeds. Unsalted canned beans. Lean cuts of beef with fat trimmed off. Low-sodium, lean precooked or cured meat, such as sausages or meat loaves. Dairy Low-fat (1%) or fat-free (skim) milk. Reduced-fat, low-fat, or fat-free cheeses. Nonfat, low-sodium ricotta or cottage cheese. Low-fat or nonfat yogurt. Low-fat, low-sodium cheese. Fats and oils Soft margarine without trans fats. Vegetable oil. Reduced-fat, low-fat, or light mayonnaise and salad dressings (reduced-sodium). Canola, safflower, olive, avocado, soybean, and sunflower oils. Avocado. Seasonings and condiments Herbs. Spices. Seasoning mixes without salt. Other foods Unsalted popcorn and pretzels. Fat-free sweets. The items listed above may not be a complete list of foods and beverages you can eat. Contact a dietitian for more information. What foods should I avoid? Fruits Canned fruit in a light or heavy syrup. Fried fruit. Fruit in cream or butter sauce. Vegetables Creamed or fried vegetables. Vegetables in a cheese sauce. Regular canned vegetables (not low-sodium or reduced-sodium). Regular canned tomato sauce and paste (not low-sodium or reduced-sodium). Regular tomato and  vegetable juice (not low-sodium or reduced-sodium). Angie Fava. Olives. Grains Baked goods made with fat, such as croissants, muffins, or some breads. Dry pasta or rice meal packs. Meats and other proteins Fatty cuts of meat. Ribs. Fried meat. Berniece Salines. Bologna, salami, and other precooked or cured meats, such as sausages or meat loaves. Fat from the back of a pig (fatback). Bratwurst. Salted nuts and seeds. Canned beans with added salt. Canned or smoked fish. Whole eggs or egg yolks. Chicken or [place] with skin. Dairy Whole or 2% milk, cream, and half-and-half. Whole or full-fat cream cheese. Whole-fat or sweetened yogurt. Full-fat cheese. Nondairy creamers. Whipped toppings. Processed cheese and cheese spreads. Fats and oils Butter. Stick margarine. Lard. Shortening. Ghee. Bacon fat. Tropical oils, such as coconut, palm kernel, or palm oil. Seasonings and condiments Onion salt, garlic salt, seasoned salt, table salt, and sea salt. Worcestershire sauce. Tartar sauce. Barbecue sauce. Teriyaki sauce. Soy sauce, including reduced-sodium. Steak sauce. Canned and packaged gravies. Fish sauce. Oyster sauce. Cocktail sauce. Store-bought horseradish. Ketchup. Mustard. Meat flavorings and tenderizers. Bouillon cubes. Hot sauces. Pre-made or packaged marinades. Pre-made or packaged taco seasonings. Relishes. Regular salad dressings. Other foods Salted popcorn and pretzels. The items listed above may not be  a complete list of foods and beverages you should avoid. Contact a dietitian for more information. Where to find more information  National Heart, Lung, and Blood Institute: https://wilson-eaton.com/  American Heart Association: www.heart.org  Academy of Nutrition and Dietetics: www.eatright.[place]: www.kidney.org Summary  The DASH eating plan is a healthy eating plan that has been shown to reduce high blood pressure (hypertension). It may also reduce your risk for type 2 diabetes, heart  disease, and stroke.  When on the DASH eating plan, aim to eat more fresh fruits and vegetables, whole grains, lean proteins, low-fat dairy, and heart-healthy fats.  With the DASH eating plan, you should limit salt (sodium) intake to 2,300 mg a day. If you have hypertension, you may need to reduce your sodium intake to 1,500 mg a day.  Work with your health care provider or dietitian to adjust your eating plan to your individual calorie needs. This information is not intended to replace advice given to you by your health care provider. Make sure you discuss any questions you have with your health care provider. Document Revised: 04/25/2019 Document Reviewed: 04/25/2019 Elsevier Patient Education  2021 [place].  Amlodipine Tablets What is this medicine? AMLODIPINE (am LOE di peen) is a calcium channel blocker. It relaxes your blood vessels and decreases the amount of work the heart has to do. It treats high blood pressure and/or prevents chest pain (also called angina). This medicine may be used for other purposes; ask your health care provider or pharmacist if you have questions. COMMON BRAND NAME(S): Norvasc What should I tell my health care provider before I take this medicine? They need to know if you have any of these conditions:  heart disease  liver disease  an unusual or allergic reaction to amlodipine, other drugs, foods, dyes, or preservatives  pregnant or trying to get pregnant  breast-feeding How should I use this medicine? Take this medicine by mouth. Take it as directed on the prescription label at the same time every day. You can take it with or without food. If it upsets your stomach, take it with food. Keep taking it unless your health care provider tells you to stop. Talk to your health care provider about the use of this medicine in children. While it may be prescribed for children as young as 6 for selected conditions, precautions do apply. Overdosage: If you  think you have taken too much of this medicine contact a poison control center or emergency room at once. NOTE: This medicine is only for you. Do not share this medicine with others. What if I miss a dose? If you miss a dose, take it as soon as you can. If it is almost time for your next dose, take only that dose. Do not take double or extra doses. What may interact with this medicine? This medicine may interact with the following medications:  clarithromycin  cyclosporine  diltiazem  itraconazole  simvastatin  tacrolimus This list may not describe all possible interactions. Give your health care provider a list of all the medicines, herbs, non-prescription drugs, or dietary supplements you use. Also tell them if you smoke, drink alcohol, or use illegal drugs. Some items may interact with your medicine. What should I watch for while using this medicine? Visit your health care provider for regular checks on your progress. Check your blood pressure as directed. Ask your health care provider what your blood pressure should be. Also, find out when you should contact him or  her. Do not treat yourself for coughs, colds, or pain while you are using this medicine without asking your health care provider for advice. Some medicines may increase your blood pressure. You may get drowsy or dizzy. Do not drive, use machinery, or do anything that needs mental alertness until you know how this medicine affects you. Do not stand up or sit up quickly, especially if you are an older patient. This reduces the risk of dizzy or fainting spells. Alcohol can make you more drowsy and dizzy. Avoid alcoholic drinks. What side effects may I notice from receiving this medicine? Side effects that you should report to your doctor or health care provider as soon as possible:  allergic reactions (skin rash, itching or hives; swelling of the face, lips, or tongue)  heart attack (trouble breathing; pain or tightness in the  chest, neck, back or arms; unusually weak or tired)  low blood pressure (dizziness; feeling faint or lightheaded, falls; unusually weak or tired) Side effects that usually do not require medical attention (report these to your doctor or health care provider if they continue or are bothersome):  facial flushing  nausea  palpitations  stomach pain  sudden weight gain  swelling of the ankles, feet, hands This list may not describe all possible side effects. Call your doctor for medical advice about side effects. You may report side effects to FDA at 1-800-FDA-1088. Where should I keep my medicine? Keep out of the reach of children and pets. Store at room temperature between 20 and 25 degrees C (68 and 77 degrees F). Protect from light and moisture. Keep the container tightly closed. Get rid of any unused medicine after the expiration date. To get rid of medicines that are no longer needed or have expired:  Take the medicine to a medicine take-back program. Check with your pharmacy or law enforcement to find a location.  If you cannot return the medicine, check the label or package insert to see if the medicine should be thrown out in the garbage or flushed down the toilet. If you are not sure, ask your health care provider. If it is safe to put in the trash, empty the medicine out of the container. Mix the medicine with cat litter, dirt, coffee grounds, or other unwanted substance. Seal the mixture in a bag or container. Put it in the trash. NOTE: This sheet is a summary. It may not cover all possible information. If you have questions about this medicine, talk to your doctor, pharmacist, or health care provider.  2021 Elsevier/Gold Standard (2020-04-17 14:59:47)

## 2020-09-01 ENCOUNTER — Encounter: Payer: Self-pay | Admitting: Family Medicine

## 2020-09-01 ENCOUNTER — Ambulatory Visit (INDEPENDENT_AMBULATORY_CARE_PROVIDER_SITE_OTHER): Payer: Medicare HMO | Admitting: Family Medicine

## 2020-09-01 ENCOUNTER — Other Ambulatory Visit: Payer: Self-pay

## 2020-09-01 DIAGNOSIS — M5442 Lumbago with sciatica, left side: Secondary | ICD-10-CM

## 2020-09-01 MED ORDER — BACLOFEN 10 MG PO TABS: 5.0000 mg | ORAL_TABLET | Freq: Three times a day (TID) | ORAL | 3 refills | Status: DC | PRN

## 2020-09-01 MED ORDER — TRAMADOL HCL 50 MG PO TABS: 50.0000 mg | ORAL_TABLET | Freq: Four times a day (QID) | ORAL | 0 refills | Status: DC | PRN

## 2020-09-01 MED ORDER — CELECOXIB 100 MG PO CAPS: 100.0000 mg | ORAL_CAPSULE | Freq: Two times a day (BID) | ORAL | 3 refills | Status: DC | PRN

## 2020-09-01 NOTE — Patient Instructions (Signed)
     Dr. Jene Every [address] [place],  [address] Phone: [phone] Fax: [phone]

## 2020-09-01 NOTE — Progress Notes (Signed)
Office Visit Note   Patient: Samantha Mcgrath           Date of Birth: [birth year]           MRN: [id number] Visit Date: 09/01/2020 Requested by: Sandrea Hughs, NP [address] [place],  [place] [address] PCP: Sandrea Hughs, NP  Subjective: Chief Complaint  Patient presents with  . Lower Back - Pain    Pain in the center of her lower back, radiating into the left buttock and down the anterior leg to the ankle/foot x 2 months. +numbness/tingling in the thigh. Pain in the left groin - hurts to raise the left leg. Will start PT on 09/14/20. Will be traveling to [facility] soon (driving).  . Left Hip - Pain    HPI: She is here with left posterior hip and leg pain.  Symptoms started about 2 months ago, no injury.  Pain in the left buttock area radiating down the anterior leg and sometimes into the ankle and foot.  She has had some numbness and tingling in the thigh.  Occasional pain in the left groin area.  Prednisone Dosepak gives her temporary relief.  She has a history of back problems in 2011, she had an MRI scan showing spondylosis and disc protrusions.  Ultimately her pain resolved and she has been doing well since then.  Denies fevers or chills.  She has had some nighttime urinary incontinence when sitting up in bed but no daytime incontinence.              ROS:   All other systems were reviewed and are negative.  Objective: Vital Signs: There were no vitals taken for this visit.  Physical Exam:  General:  Alert and oriented, in no acute distress. Pulm:  Breathing unlabored. Psy:  Normal mood, congruent affect. Skin: No visible rash Low back: She is tender to palpation over the L4-5 and L5-S1 level, also over the left SI joint and slightly in the left sciatic notch.  Minimal groin pain with passive internal hip rotation.  Lower extremity strength and reflexes are normal.  Straight leg raise is negative.  Imaging: Recent x-rays viewed on computer reveal left SI joint degenerative  change and moderate L4-5 and L5-S1 degenerative disc disease.  Hip joint looks good.   Assessment & Plan: 1.  Left posterior hip and leg pain, possibilities include sacroiliac dysfunction versus lumbar disc protrusion. -She is already scheduled for physical therapy starting April 12.  She will try chiropractic per Dr. Noberto Retort prior to that.  I gave her baclofen, Celebrex and tramadol to take as needed.  She will be traveling to [place] this weekend. -Consider MRI or possibly referral for injection if symptoms persist.     Procedures: No procedures performed        PMFS History: Patient Active Problem List   Diagnosis Date Noted  . Stage 3b chronic kidney disease ([place]) 10/08/2019  . Anxiety 07/08/2019  . Combined forms of age-related cataract of right eye 08/15/2018  . Gastroesophageal reflux disease without esophagitis 01/29/2018  . Asthma 01/28/2018  . Hyperlipidemia 01/28/2018  . Type 2 diabetes mellitus without complication, with long-term current use of insulin ([place]) 01/28/2018  . Retinal detachment of left eye with multiple breaks 11/14/2016  . Morbid obesity ([place]) 08/17/2015  . Essential hypertension 06/01/2014   Past Medical History:  Diagnosis Date  . Asthma   . Bronchitis   . Cataract    bilateral  . Depression   .  Diabetes mellitus without complication ([place])   . GERD (gastroesophageal reflux disease)   . Gout   . Hyperlipidemia   . Hypertension   . Pneumonia   . Restrictive airway disease   . Retinal detachment    left    No family history on file.  Past Surgical History:  Procedure Laterality Date  . ANKLE SURGERY    . BLADDER SURGERY    . EYE SURGERY    . MELANOMA EXCISION     Of Right Leg  . TONSILLECTOMY    . TUBAL LIGATION     Social History   Occupational History  . Not on file  Tobacco Use  . Smoking status: Never Smoker  . Smokeless tobacco: Never Used  Vaping Use  . Vaping Use: Never used  Substance and Sexual Activity  . Alcohol  use: Yes    Comment: Social Drinker  . Drug use: Never  . Sexual activity: Not Currently

## 2020-09-13 ENCOUNTER — Other Ambulatory Visit: Payer: Self-pay

## 2020-09-13 ENCOUNTER — Ambulatory Visit (INDEPENDENT_AMBULATORY_CARE_PROVIDER_SITE_OTHER): Payer: Medicare HMO | Admitting: Family

## 2020-09-13 ENCOUNTER — Encounter: Payer: Self-pay | Admitting: Family

## 2020-09-13 VITALS — BP 140/70 | HR 63 | Temp 97.7°F | Resp 18 | Ht 66.0 in | Wt 232.2 lb

## 2020-09-13 DIAGNOSIS — M25471 Effusion, right ankle: Secondary | ICD-10-CM

## 2020-09-13 DIAGNOSIS — M25472 Effusion, left ankle: Secondary | ICD-10-CM

## 2020-09-13 DIAGNOSIS — I10 Essential (primary) hypertension: Secondary | ICD-10-CM | POA: Diagnosis not present

## 2020-09-13 MED ORDER — LOSARTAN POTASSIUM 25 MG PO TABS: 25.0000 mg | ORAL_TABLET | Freq: Every day | ORAL | 0 refills | Status: DC

## 2020-09-13 NOTE — Progress Notes (Signed)
Provider: Marlowe Sax FNP-C  Arrie Zuercher, Nelda Bucks, NP  Patient Care Team: Brittany Amirault, Nelda Bucks, NP as PCP - General (Family Medicine) Buford Dresser, MD as PCP - Cardiology (Cardiology) Binnie Rail, DC as Referring Physician (Chiropractic Medicine)  Extended Emergency Contact Information Primary Emergency Contact: [facility] Address: [address]          [place], [place] [address] Johnnette Litter of [place] Phone: [phone] Mobile Phone: [phone] Relation: Son  Code Status:  Full Code  Goals of care: Advanced Directive information Advanced Directives 09/13/2020  Does Patient Have a Medical Advance Directive? Yes  Type of Paramedic of [place];Living will;Out of facility DNR (pink MOST or yellow form)  Does patient want to make changes to medical advance directive? No - Patient declined  Copy of [place] in Chart? No - copy requested  Would patient like information on creating a medical advance directive? -     Chief Complaint  Patient presents with  . Follow-up    2 week follow up.    HPI:  Pt is a 70 y.o. female seen today for an acute visit for 2 weeks follow up for evaluation of high blood pressure.she was here 08/30/2020 was 180/102 clonidine 0.1 mg tablet was given with much improvement.she was asymptomatic though her hip and lower back pain could have contributed to high blood pressure.Amlodipine 5 mg tablet daily was initiated.B/p at home down to 130's/60's - 140's/70's.   X-rayed ordered showed SI degenerative change and moderate L4 -% and L5 -S1 degenerative changes.She was referred to Orthopedic for evaluation of pain.Baclofen,cerbrex and tramadol was prescribed.Also chiropractic.Plans to start Physical therapy tomorrow.States pain has improved. She concerned that her ankles have been swollen.Not sure whether from prolong sitting travelling to [place] and back.    Past Medical History:  Diagnosis  Date  . Asthma   . Bronchitis   . Cataract    bilateral  . Depression   . Diabetes mellitus without complication ([place])   . GERD (gastroesophageal reflux disease)   . Gout   . Hyperlipidemia   . Hypertension   . Pneumonia   . Restrictive airway disease   . Retinal detachment    left   Past Surgical History:  Procedure Laterality Date  . ANKLE SURGERY    . BLADDER SURGERY    . EYE SURGERY    . MELANOMA EXCISION     Of Right Leg  . TONSILLECTOMY    . TUBAL LIGATION      Allergies  Allergen Reactions  . Procaine Other (See Comments) and Palpitations    ALSO BLISTERS WITH TOPICAL Novacaine   Novocain. ALSO BLISTERS WITH TOPICAL    Outpatient Encounter Medications as of 09/13/2020  Medication Sig  . amLODipine (NORVASC) 5 MG tablet Take 1 tablet (5 mg total) by mouth daily.  Marland Kitchen atorvastatin (LIPITOR) 10 MG tablet Take 10 mg by mouth daily.  . baclofen (LIORESAL) 10 MG tablet Take 0.5-1 tablets (5-10 mg total) by mouth 3 (three) times daily as needed for muscle spasms.  . celecoxib (CELEBREX) 100 MG capsule Take 1 capsule (100 mg total) by mouth 2 (two) times daily as needed.  . chlorthalidone (HYGROTON) 50 MG tablet Take 50 mg by mouth daily.  . Cholecalciferol (VITAMIN D3) 250 MCG (10000 UT) capsule Take 10,000 Units by mouth daily.  Marland Kitchen gabapentin (NEURONTIN) 100 MG capsule Take 1 capsule (100 mg total) by mouth 2 (two) times daily.  Marland Kitchen glipiZIDE (GLUCOTROL XL) 5  MG 24 hr tablet Take 5 mg by mouth at bedtime.  . metFORMIN (GLUCOPHAGE-XR) 500 MG 24 hr tablet TAKE TWO TABLETS BY MOUTH TWICE A DAY  . montelukast (SINGULAIR) 10 MG tablet Take 10 mg by mouth at bedtime.  . pantoprazole (PROTONIX) 40 MG tablet Take 40 mg by mouth 2 (two) times daily.  Marland Kitchen tiZANidine (ZANAFLEX) 4 MG tablet Take 1 tablet (4 mg total) by mouth daily as needed.  . traMADol (ULTRAM) 50 MG tablet Take 1 tablet (50 mg total) by mouth every 6 (six) hours as needed.   No facility-administered encounter  medications on file as of 09/13/2020.    Review of Systems  Constitutional: Negative for appetite change, chills, fatigue and fever.  Respiratory: Negative for cough, chest tightness, shortness of breath and wheezing.   Cardiovascular: Positive for leg swelling. Negative for chest pain and palpitations.  Gastrointestinal: Negative for abdominal distention, abdominal pain, diarrhea, nausea and vomiting.  Genitourinary: Negative for difficulty urinating, dysuria, flank pain, frequency and urgency.  Musculoskeletal: Positive for arthralgias and back pain. Negative for gait problem and myalgias.  Neurological: Negative for dizziness, speech difficulty, weakness, light-headedness and headaches.  Psychiatric/Behavioral: Negative for agitation. The patient is not nervous/anxious.     Immunization History  Administered Date(s) Administered  . Influenza Split 03/10/2011, 03/28/2012, 03/03/2014, 04/02/2015  . Influenza, High Dose Seasonal PF 03/28/2019, 03/17/2020  . Influenza,inj,Quad PF,6-35 Mos 03/19/2018  . Influenza,trivalent, recombinat, inj, PF 03/03/2014  . Influenza-Unspecified 03/10/2011, 03/28/2012  . Moderna Sars-Covid-2 Vaccination 07/01/2019, 07/29/2019, 04/12/2020  . Tdap 10/28/2014   Pertinent  Health Maintenance Due  Topic Date Due  . FOOT EXAM  Never done  . OPHTHALMOLOGY EXAM  Never done  . COLONOSCOPY (Pts 45-61yrs Insurance coverage will need to be confirmed)  Never done  . MAMMOGRAM  Never done  . DEXA SCAN  Never done  . PNA vac Low Risk Adult (1 of 2 - PCV13) Never done  . HEMOGLOBIN A1C  12/27/2020  . INFLUENZA VACCINE  01/03/2021  . URINE MICROALBUMIN  06/29/2021   Fall Risk  09/13/2020 08/30/2020 07/27/2020  Falls in the past year? 0 0 0  Number falls in past yr: 0 0 0  Injury with Fall? 0 0 0   Functional Status Survey:    Vitals:   09/13/20 1307  BP: 140/70  Pulse: 63  Resp: 18  Temp: 97.7 F (36.5 C)  SpO2: 92%  Weight: 232 lb 3.2 oz (105.3 kg)   Height: 5\' 6"  (1.676 m)   Body mass index is 37.48 kg/m. Physical Exam Vitals reviewed.  Constitutional:      General: She is not in acute distress.    Appearance: She is obese. She is not ill-appearing.  HENT:     Head: Normocephalic.  Eyes:     General: No scleral icterus.       Right eye: No discharge.        Left eye: No discharge.     Conjunctiva/sclera: Conjunctivae normal.     Pupils: Pupils are equal, round, and reactive to light.  Neck:     Vascular: No carotid bruit.  Cardiovascular:     Rate and Rhythm: Normal rate and regular rhythm.     Pulses: Normal pulses.     Heart sounds: Normal heart sounds. No murmur heard. No friction rub. No gallop.   Pulmonary:     Effort: Pulmonary effort is normal. No respiratory distress.     Breath sounds: Normal breath sounds. No wheezing,  rhonchi or rales.  Chest:     Chest wall: No tenderness.  Abdominal:     General: Bowel sounds are normal. There is no distension.     Palpations: Abdomen is soft. There is no mass.     Tenderness: There is no abdominal tenderness. There is no right CVA tenderness, left CVA tenderness, guarding or rebound.  Musculoskeletal:        General: No swelling or tenderness.     Cervical back: Normal range of motion. No rigidity or tenderness.     Comments: Bilateral non-pitting edema to ankles  Right hip tender to palpation.   Lymphadenopathy:     Cervical: No cervical adenopathy.  Skin:    General: Skin is warm and dry.     Coloration: Skin is not pale.     Findings: No bruising or erythema.  Neurological:     Mental Status: She is alert and oriented to person, place, and time.     Cranial Nerves: No cranial nerve deficit.     Sensory: No sensory deficit.     Motor: No weakness.     Gait: Gait normal.  Psychiatric:        Mood and Affect: Mood normal.        Speech: Speech normal.        Behavior: Behavior normal.        Thought Content: Thought content normal.        Judgment: Judgment  normal.     Labs reviewed: Recent Labs    06/29/20 0000  NA 142  K 4.4  CL 104  CO2 29  GLUCOSE 91  BUN 30*  CREATININE 0.98*  CALCIUM 9.7   Recent Labs    06/29/20 0000  AST 11  ALT 9  BILITOT 0.3  PROT 6.6   Recent Labs    06/29/20 0000 07/27/20 1022  WBC 7.3 9.5  NEUTROABS 3,584 5,900  HGB 11.5* 12.2  HCT 35.8 36.5  MCV 84.8 84.5  PLT 476* 490*   Lab Results  Component Value Date   TSH 3.40 06/29/2020   Lab Results  Component Value Date   HGBA1C 7.1 (H) 06/29/2020   Lab Results  Component Value Date   CHOL 202 (H) 06/29/2020   HDL 63 06/29/2020   LDLCALC 114 (H) 06/29/2020   TRIG 141 06/29/2020   CHOLHDL 3.2 06/29/2020    Significant Diagnostic Results in last 30 days:  No results found.  Assessment/Plan   Essential (primary) hypertension B/p has improved this visit. Presents with bilateral ankle swelling which I suspect side effects from  Amlodipine.will discontinue Amlodipine 5 mg tablet then initiate losartan as below.Side effects discussed. - check blood pressure once daily notify provider for any readings > 140/90  - losartan (COZAAR) 25 MG tablet; Take 1 tablet (25 mg total) by mouth daily.  Dispense: 30 tablet; Refill: 0 - Follow up in 2 weeks for B/p check advised to bring home log  2. Swelling of both ankles Non-pitting edema to both ankles  Recently started on Amlodipine suspect side effects.Discontinue Amlodipine as above    Family/ staff Communication: Reviewed plan of care with patient verbalized understanding.  Labs/tests ordered: None   Next Appointment: - Follow up in 2 weeks for B/p check advised to bring home log  Sandrea Hughs, NP

## 2020-09-13 NOTE — Patient Instructions (Signed)
-   check blood pressure once daily notify provider for any readings > 140/90

## 2020-09-14 ENCOUNTER — Ambulatory Visit: Payer: Medicare HMO | Attending: Orthopedic Surgery

## 2020-09-14 DIAGNOSIS — M5442 Lumbago with sciatica, left side: Secondary | ICD-10-CM | POA: Insufficient documentation

## 2020-09-14 DIAGNOSIS — M5386 Other specified dorsopathies, lumbar region: Secondary | ICD-10-CM | POA: Diagnosis not present

## 2020-09-14 DIAGNOSIS — M25552 Pain in left hip: Secondary | ICD-10-CM | POA: Insufficient documentation

## 2020-09-14 DIAGNOSIS — M5441 Lumbago with sciatica, right side: Secondary | ICD-10-CM | POA: Insufficient documentation

## 2020-09-15 NOTE — Therapy (Signed)
[place], [place], [address] Phone: [phone]   Fax:  [phone]  Physical Therapy Evaluation  Patient Details  Name: Samantha Mcgrath MRN: [id number] Date of Birth: [birth year] Referring Provider (PT): Yvonna Alanis, NP   Encounter Date: 09/14/2020   PT End of Session - 09/14/20 1041    Visit Number 1    Number of Visits 13    Date for PT Re-Evaluation 11/06/20    Authorization Type [employer] MEDICARE HMO/PPO    Progress Note Due on Visit 10    PT Start Time 1000    PT Stop Time 1045    PT Time Calculation (min) 45 min    Activity Tolerance Patient tolerated treatment well    Behavior During Therapy [facility] for tasks assessed/performed           Past Medical History:  Diagnosis Date  . Asthma   . Bronchitis   . Cataract    bilateral  . Depression   . Diabetes mellitus without complication ([place])   . GERD (gastroesophageal reflux disease)   . Gout   . Hyperlipidemia   . Hypertension   . Pneumonia   . Restrictive airway disease   . Retinal detachment    left    Past Surgical History:  Procedure Laterality Date  . ANKLE SURGERY    . BLADDER SURGERY    . EYE SURGERY    . MELANOMA EXCISION     Of Right Leg  . TONSILLECTOMY    . TUBAL LIGATION      There were no vitals filed for this visit.    Subjective Assessment - 09/15/20 1831    Subjective Extreme pain in my low back and L leg for the past 2 months. 2011 had herniated discs and she recovered quicky from that issue. Prolonged standing and lifting aggrevate pain. Hurts when trying to go to sleep.    Limitations Standing;House hold activities    How long can you sit comfortably? Depends, an office chair with lumbar support is usually good    How long can you stand comfortably? Usually bothers her    How long can you walk comfortably? More comfortable than standing    Diagnostic tests 08/13/20:   FINDINGS:  No acute fracture or  dislocation. No focal osseous lesion. Mild  degenerative sclerosis of the left sacroiliac joint. Eccentric right  degenerative disc disease at L4-5.     IMPRESSION:  No acute osseous abnormality.    Patient Stated Goals For pain to improve.    Currently in Pain? Yes    Pain Score 5     Pain Location Back    Pain Orientation Left;Posterior;Lower    Pain Descriptors / Indicators Aching;Pins and needles    Pain Type Chronic pain    Pain Onset More than a month ago    Pain Frequency Intermittent    Aggravating Factors  standing still, sitting too long    Pain Relieving Factors medications              [facility] PT Assessment - 09/15/20 0001      Assessment   Medical Diagnosis Hip pain, acute, L; Acute midline low back pain with bilateral sciatica    Referring Provider (PT) Cleophas Dunker, Amy E, NP    Onset Date/Surgical Date --   approx 4 months ago   Hand Dominance Left    Prior Therapy No      Precautions   Precautions None  Restrictions   Weight Bearing Restrictions No      Balance Screen   Has the patient fallen in the past 6 months No      [place] residence    Living Arrangements Alone    Type of [place] to enter    Entrance Stairs-Number of Steps 2    Entrance Stairs-Rails None    Home Layout One level      Prior Function   Level of Independence Independent    Vocation Full time Teaching laboratory technician Requirements Sitting, standing, walking      Cognition   Overall Cognitive Status Within Functional Limits for tasks assessed      Observation/Other Assessments   Focus on Therapeutic Outcomes (FOTO)  49% functional ability      Sensation   Light Touch Appears Intact      Posture/Postural Control   Posture/Postural Control Postural limitations    Postural Limitations Rounded Shoulders;Forward head      Deep Tendon Reflexes   DTR Assessment Site Patella;Achilles    Patella DTR 2+     Achilles DTR 2+      ROM / Strength   AROM / PROM / Strength AROM;Strength      AROM   AROM Assessment Site Lumbar    Lumbar Flexion 25% limited; lordosis not reversed; no inc in LBP    Lumbar Extension 75% limited; inc in LBP    Lumbar - Right Side Bend 0% limited; no LBP    Lumbar - Left Side Bend 25% limited; LB stiffness    Lumbar - Right Rotation WFLs    Lumbar - Left Rotation WFLs      Strength   Overall Strength Comments LE myotomal screeen negative      Special Tests    Special Tests Lumbar    Lumbar Tests Straight Leg Raise;Slump Test      Slump test   Findings Negative      Straight Leg Raise   Findings Positive    Side  Left    Comment for L low back pain      Transfers   Transfers Sit to Stand;Stand to Sit    Sit to Stand 7: Independent      Ambulation/Gait   Ambulation/Gait Yes    Ambulation/Gait Assistance 7: Independent                      Objective measurements completed on examination: See above findings.       [place] Adult PT Treatment/Exercise - 09/15/20 0001      Exercises   Exercises Lumbar      Lumbar Exercises: Stretches   Single Knee to Chest Stretch 5 reps;10 seconds    Pelvic Tilt 10 reps   3 sec   Other Lumbar Stretch Exercise Seatd trunk flexion 5x; 10 sec                  PT Education - 09/15/20 1516    Education Details Eval findings, POC, HEP, positions and support for sleeping, staggered stance for standing to reduce low back strain    Person(s) Educated Patient    Methods Explanation;Demonstration;Tactile cues;Verbal cues;Handout    Comprehension Verbalized understanding;Returned demonstration;Verbal cues required;Tactile cues required            PT Short Term Goals - 09/15/20 1608      PT SHORT TERM  GOAL #1   Title Pt will be Ind in an initial HEP.    Baseline Started on eval    Status New    Target Date 10/06/20      PT SHORT TERM GOAL #2   Title Pt will voice understanding of measures to  assist in the redution of pain.    Status New    Target Date 10/06/20             PT Long Term Goals - 09/15/20 1813      PT LONG TERM GOAL #1   Title Pt will be Ind in a final HEP to maintain or progress achieved level of function    Status New    Target Date 11/06/20      PT LONG TERM GOAL #2   Title Increase pt's trunk ext ROM to 25% limited as indication of improved low back pain and to improve standing tolerance.    Baseline 75% limited    Status New    Target Date 11/06/20      PT LONG TERM GOAL #3   Title Pt will report a decrease in her low back pain range to 0-5/10 with daily activities at home and at work    Baseline 0-10/10    Status New    Target Date 11/06/20      PT LONG TERM GOAL #4   Title Pt's FOTO score will improve to the predicted value of 62% functional ability    Baseline 49% functional ability    Status New    Target Date 11/06/20                  Plan - 09/14/20 1039    Clinical Impression Statement Pt presents to PT with a 4 month Hx of low back pain, more so than the L hip. Pt's pain was reproduced trunk ext and L SB and improved with flexion biased movements and exs. L SLR was positive indicated possible nerve irritation. Pt responded well to HEP. Pt will benefit from PT 2w6 for flexion biased exs, lumbopelvic flexibility and strenthening, education re: posture and body mechanics, and manual techiques and modalities as indicated to reduce pain and optimize function.    Personal Factors and Comorbidities Profession;Time since onset of injury/illness/exacerbation    Examination-Activity Limitations Stand;Sit    Examination-Participation Restrictions Occupation    Stability/Clinical Decision Making Stable/Uncomplicated    Clinical Decision Making Low    Rehab Potential Good    PT Frequency 2x / week    PT Duration 6 weeks    PT Treatment/Interventions ADLs/Self Care Home Management;Cryotherapy;Electrical Stimulation;Iontophoresis 4mg /ml  Dexamethasone;Moist Heat;Traction;Ultrasound;Therapeutic exercise;Therapeutic activities;Patient/family education;Manual techniques;Dry needling;Taping;Spinal Manipulations    PT Next Visit Plan Assess response to HEP, progress ther ex as indicated, use modalities and manual techniques as indicated    PT Home Exercise Plan [id number]    Consulted and Agree with Plan of Care Patient           Patient will benefit from skilled therapeutic intervention in order to improve the following deficits and impairments:  Decreased range of motion,Obesity,Decreased activity tolerance,Pain,Decreased strength,Postural dysfunction  Visit Diagnosis: Acute midline low back pain with bilateral sciatica  Acute hip pain, left  Decreased ROM of lumbar spine     Problem List Patient Active Problem List   Diagnosis Date Noted  . Stage 3b chronic kidney disease ([place]) 10/08/2019  . Anxiety 07/08/2019  . Combined forms of age-related cataract of right eye 08/15/2018  .  Gastroesophageal reflux disease without esophagitis 01/29/2018  . Asthma 01/28/2018  . Hyperlipidemia 01/28/2018  . Type 2 diabetes mellitus without complication, with long-term current use of insulin ([place]) 01/28/2018  . Retinal detachment of left eye with multiple breaks 11/14/2016  . Morbid obesity ([place]) 08/17/2015  . Essential hypertension 06/01/2014    Gar Ponto MS, PT 09/15/20 6:34 PM  [place] [facility] [address] [place], [place], [address] Phone: [phone]   Fax:  [phone]  Name: Samantha Mcgrath MRN: [id number] Date of Birth: [birth year]

## 2020-09-21 ENCOUNTER — Ambulatory Visit: Payer: Medicare HMO

## 2020-09-21 ENCOUNTER — Other Ambulatory Visit: Payer: Self-pay | Admitting: Family

## 2020-09-21 ENCOUNTER — Other Ambulatory Visit: Payer: Self-pay

## 2020-09-21 DIAGNOSIS — M5441 Lumbago with sciatica, right side: Secondary | ICD-10-CM | POA: Diagnosis not present

## 2020-09-21 DIAGNOSIS — M5386 Other specified dorsopathies, lumbar region: Secondary | ICD-10-CM

## 2020-09-21 DIAGNOSIS — I10 Essential (primary) hypertension: Secondary | ICD-10-CM

## 2020-09-21 DIAGNOSIS — M25552 Pain in left hip: Secondary | ICD-10-CM

## 2020-09-21 DIAGNOSIS — M5442 Lumbago with sciatica, left side: Secondary | ICD-10-CM

## 2020-09-21 NOTE — Therapy (Signed)
[place], [place], [address] Phone: [phone]   Fax:  [phone]  Physical Therapy Treatment  Patient Details  Name: Samantha Mcgrath MRN: [id number] Date of Birth: 03-Jun-1951 Referring Provider (PT): Yvonna Alanis, NP   Encounter Date: 09/21/2020   PT End of Session - 09/21/20 1711    Visit Number 2    Number of Visits 13    Date for PT Re-Evaluation 11/06/20    Authorization Type AETNA MEDICARE HMO/PPO    Progress Note Due on Visit 10    PT Start Time 1707    PT Stop Time 1752    PT Time Calculation (min) 45 min    Activity Tolerance Patient tolerated treatment well    Behavior During Therapy [facility] for tasks assessed/performed           Past Medical History:  Diagnosis Date  . Asthma   . Bronchitis   . Cataract    bilateral  . Depression   . Diabetes mellitus without complication ([place])   . GERD (gastroesophageal reflux disease)   . Gout   . Hyperlipidemia   . Hypertension   . Pneumonia   . Restrictive airway disease   . Retinal detachment    left    Past Surgical History:  Procedure Laterality Date  . ANKLE SURGERY    . BLADDER SURGERY    . EYE SURGERY    . MELANOMA EXCISION     Of Right Leg  . TONSILLECTOMY    . TUBAL LIGATION      There were no vitals filed for this visit.   Subjective Assessment - 09/21/20 1728    Subjective Pt reports her low back is doing well todat, a 2/10 level of pain. On Sunday if bothered her with standing and pastoring 2 Easter services, 7/10.    Diagnostic tests 08/13/20:   FINDINGS:  No acute fracture or dislocation. No focal osseous lesion. Mild  degenerative sclerosis of the left sacroiliac joint. Eccentric right  degenerative disc disease at L4-5.     IMPRESSION:  No acute osseous abnormality.    Patient Stated Goals For pain to improve.    Currently in Pain? Yes    Pain Score 2     Pain Location Back    Pain Orientation Left;Posterior;Lower     Pain Descriptors / Indicators Aching;Pins and needles    Pain Onset More than a month ago    Pain Frequency Intermittent                             [facility] Adult PT Treatment/Exercise - 09/21/20 0001      Lumbar Exercises: Stretches   Single Knee to Chest Stretch 3 reps;Right;Left   15 sec   Pelvic Tilt 10 reps   3 sec   Other Lumbar Stretch Exercise Seatd trunk flexion forward, then laterally L; 5x; 10 sec      Lumbar Exercises: Supine   Clam 15 reps;3 seconds    Clam Limitations green Tband    Bent Knee Raise 10 reps   2 sets   Bridge 10 reps;3 seconds    Bridge Limitations neutral spine    Other Supine Lumbar Exercises HL; hip add ball squeezes; 15x, 3 sce    Other Supine Lumbar Exercises --                  PT Education - 09/21/20 1810  Education Details Body mechanics for staggered standing, and UE/LE support for activities c a forward flexed trunk. Updated HEP.    Person(s) Educated Patient    Methods Explanation;Demonstration;Tactile cues;Verbal cues;Handout    Comprehension Verbalized understanding;Returned demonstration;Verbal cues required;Tactile cues required            PT Short Term Goals - 09/15/20 1608      PT SHORT TERM GOAL #1   Title Pt will be Ind in an initial HEP.    Baseline Started on eval    Status New    Target Date 10/06/20      PT SHORT TERM GOAL #2   Title Pt will voice understanding of measures to assist in the redution of pain.    Status New    Target Date 10/06/20             PT Long Term Goals - 09/15/20 1813      PT LONG TERM GOAL #1   Title Pt will be Ind in a final HEP to maintain or progress achieved level of function    Status New    Target Date 11/06/20      PT LONG TERM GOAL #2   Title Increase pt's trunk ext ROM to 25% limited as indication of improved low back pain and to improve standing tolerance.    Baseline 75% limited    Status New    Target Date 11/06/20      PT LONG TERM GOAL  #3   Title Pt will report a decrease in her low back pain range to 0-5/10 with daily activities at home and at work    Baseline 0-10/10    Status New    Target Date 11/06/20      PT LONG TERM GOAL #4   Title Pt's FOTO score will improve to the predicted value of 62% functional ability    Baseline 49% functional ability    Status New    Target Date 11/06/20                 Plan - 09/21/20 1813    Clinical Impression Statement PT was completed for education on proper body mechanics and supoort for prolonged standing and trunk forward flexion activities. Additionally, ther ex was completed for lumbopelvic flexibility and strengthening. Pt's HEP was updated. Pt is responding appropriately to PT and tolerated today's session s adverse effects.    Personal Factors and Comorbidities Profession;Time since onset of injury/illness/exacerbation    Examination-Activity Limitations Stand;Sit    Examination-Participation Restrictions Occupation    Stability/Clinical Decision Making Stable/Uncomplicated    Clinical Decision Making Low    Rehab Potential Good    PT Frequency 2x / week    PT Duration 6 weeks    PT Treatment/Interventions ADLs/Self Care Home Management;Cryotherapy;Electrical Stimulation;Iontophoresis 4mg /ml Dexamethasone;Moist Heat;Traction;Ultrasound;Therapeutic exercise;Therapeutic activities;Patient/family education;Manual techniques;Dry needling;Taping;Spinal Manipulations    PT Next Visit Plan Assess response to progressed HEP, progress ther ex as indicated, use modalities and manual techniques as indicated    PT Home Exercise Plan [id number]    Consulted and Agree with Plan of Care Patient           Patient will benefit from skilled therapeutic intervention in order to improve the following deficits and impairments:  Decreased range of motion,Obesity,Decreased activity tolerance,Pain,Decreased strength,Postural dysfunction  Visit Diagnosis: Acute midline low back pain  with bilateral sciatica  Acute hip pain, left  Decreased ROM of lumbar spine     Problem List Patient Active  Problem List   Diagnosis Date Noted  . Stage 3b chronic kidney disease ([place]) 10/08/2019  . Anxiety 07/08/2019  . Combined forms of age-related cataract of right eye 08/15/2018  . Gastroesophageal reflux disease without esophagitis 01/29/2018  . Asthma 01/28/2018  . Hyperlipidemia 01/28/2018  . Type 2 diabetes mellitus without complication, with long-term current use of insulin ([place]) 01/28/2018  . Retinal detachment of left eye with multiple breaks 11/14/2016  . Morbid obesity ([place]) 08/17/2015  . Essential hypertension 06/01/2014    Gar Ponto MS, PT 09/21/20 6:20 PM  [facility] [address] [place], [place], [address] Phone: [phone]   Fax:  [phone]  Name: KENNEDEY DIGILIO MRN: [id number] Date of Birth: 1950-12-17

## 2020-09-23 ENCOUNTER — Other Ambulatory Visit: Payer: Self-pay

## 2020-09-23 ENCOUNTER — Ambulatory Visit: Payer: Medicare HMO

## 2020-09-23 DIAGNOSIS — M5441 Lumbago with sciatica, right side: Secondary | ICD-10-CM | POA: Diagnosis not present

## 2020-09-23 DIAGNOSIS — M5386 Other specified dorsopathies, lumbar region: Secondary | ICD-10-CM | POA: Diagnosis not present

## 2020-09-23 DIAGNOSIS — M5442 Lumbago with sciatica, left side: Secondary | ICD-10-CM

## 2020-09-23 DIAGNOSIS — M25552 Pain in left hip: Secondary | ICD-10-CM

## 2020-09-23 NOTE — Therapy (Signed)
[place], [place], [address] Phone: [phone]   Fax:  [phone]  Physical Therapy Treatment  Patient Details  Name: Samantha Mcgrath MRN: [id number] Date of Birth: Nov 21, 1950 Referring Provider (PT): Yvonna Alanis, NP   Encounter Date: 09/23/2020   PT End of Session - 09/23/20 1729    Visit Number 3    Number of Visits 13    Date for PT Re-Evaluation 11/06/20    Authorization Type AETNA MEDICARE HMO/PPO    Progress Note Due on Visit 10    PT Start Time 1705    PT Stop Time 1745    PT Time Calculation (min) 40 min    Activity Tolerance Patient tolerated treatment well    Behavior During Therapy [facility] for tasks assessed/performed           Past Medical History:  Diagnosis Date  . Asthma   . Bronchitis   . Cataract    bilateral  . Depression   . Diabetes mellitus without complication ([place])   . GERD (gastroesophageal reflux disease)   . Gout   . Hyperlipidemia   . Hypertension   . Pneumonia   . Restrictive airway disease   . Retinal detachment    left    Past Surgical History:  Procedure Laterality Date  . ANKLE SURGERY    . BLADDER SURGERY    . EYE SURGERY    . MELANOMA EXCISION     Of Right Leg  . TONSILLECTOMY    . TUBAL LIGATION      There were no vitals filed for this visit.   Subjective Assessment - 09/23/20 1709    Subjective Pt is not currently having low back pain, and her low back pain has been better    Diagnostic tests 08/13/20:   FINDINGS:  No acute fracture or dislocation. No focal osseous lesion. Mild  degenerative sclerosis of the left sacroiliac joint. Eccentric right  degenerative disc disease at L4-5.     IMPRESSION:  No acute osseous abnormality.    Patient Stated Goals For pain to improve.    Currently in Pain? No/denies    Pain Score 0-No pain    Pain Location Back    Pain Orientation Left;Posterior;Lower    Pain Descriptors / Indicators Aching    Pain Type  Chronic pain    Pain Onset More than a month ago    Pain Frequency Intermittent                             [facility] Adult PT Treatment/Exercise - 09/23/20 0001      Lumbar Exercises: Stretches   Single Knee to Chest Stretch Right;Left;2 reps   15 sec   Pelvic Tilt 10 reps   3 sec   Other Lumbar Stretch Exercise Seated trunk flexion forward, then laterally L and R; 5x; 10 sec    Other Lumbar Stretch Exercise Standing hip flexor stretch L and R, 2x, 20sec      Lumbar Exercises: Supine   Clam 15 reps;3 seconds    Clam Limitations green Tband    Bent Knee Raise 10 reps   2 sets   Bridge 10 reps;3 seconds    Bridge Limitations neutral spine    Other Supine Lumbar Exercises HL; hip add ball squeezes; 15x, 3 sce                  PT  Education - 09/23/20 1817    Education Details Body mechanics for household activities with staggered stance, foot stool for raise step, golfer's lift, and hinged lift. HEP upated.    Person(s) Educated Patient    Methods Explanation;Demonstration;Tactile cues;Verbal cues;Handout    Comprehension Verbalized understanding;Returned demonstration;Verbal cues required;Tactile cues required            PT Short Term Goals - 09/15/20 1608      PT SHORT TERM GOAL #1   Title Pt will be Ind in an initial HEP.    Baseline Started on eval    Status New    Target Date 10/06/20      PT SHORT TERM GOAL #2   Title Pt will voice understanding of measures to assist in the redution of pain.    Status New    Target Date 10/06/20             PT Long Term Goals - 09/15/20 1813      PT LONG TERM GOAL #1   Title Pt will be Ind in a final HEP to maintain or progress achieved level of function    Status New    Target Date 11/06/20      PT LONG TERM GOAL #2   Title Increase pt's trunk ext ROM to 25% limited as indication of improved low back pain and to improve standing tolerance.    Baseline 75% limited    Status New    Target Date  11/06/20      PT LONG TERM GOAL #3   Title Pt will report a decrease in her low back pain range to 0-5/10 with daily activities at home and at work    Baseline 0-10/10    Status New    Target Date 11/06/20      PT LONG TERM GOAL #4   Title Pt's FOTO score will improve to the predicted value of 62% functional ability    Baseline 49% functional ability    Status New    Target Date 11/06/20                 Plan - 09/23/20 1820    Clinical Impression Statement Pt reports consistent completion of HEP. Subjective report indicates good response to PT with improvement in her low back pain. PT was continued for flexion biased exs for lumbar flexibility and lumbopelvic strengthening. Education for proper Economist with household activities was provided. Pt will continue from from PT to reduce pain and optimize the function.    Personal Factors and Comorbidities Profession;Time since onset of injury/illness/exacerbation    Examination-Activity Limitations Stand;Sit    Examination-Participation Restrictions Occupation    Stability/Clinical Decision Making Stable/Uncomplicated    Clinical Decision Making Low    Rehab Potential Good    PT Frequency 2x / week    PT Duration 6 weeks    PT Treatment/Interventions ADLs/Self Care Home Management;Cryotherapy;Electrical Stimulation;Iontophoresis 4mg /ml Dexamethasone;Moist Heat;Traction;Ultrasound;Therapeutic exercise;Therapeutic activities;Patient/family education;Manual techniques;Dry needling;Taping;Spinal Manipulations    PT Next Visit Plan Assess response to progressed HEP, progress ther ex as indicated, use modalities and manual techniques as indicated    PT Home Exercise Plan [id number]    Consulted and Agree with Plan of Care Patient           Patient will benefit from skilled therapeutic intervention in order to improve the following deficits and impairments:  Decreased range of motion,Obesity,Decreased activity  tolerance,Pain,Decreased strength,Postural dysfunction  Visit Diagnosis: Acute midline low back pain with bilateral  sciatica  Acute hip pain, left  Decreased ROM of lumbar spine     Problem List Patient Active Problem List   Diagnosis Date Noted  . Stage 3b chronic kidney disease ([place]) 10/08/2019  . Anxiety 07/08/2019  . Combined forms of age-related cataract of right eye 08/15/2018  . Gastroesophageal reflux disease without esophagitis 01/29/2018  . Asthma 01/28/2018  . Hyperlipidemia 01/28/2018  . Type 2 diabetes mellitus without complication, with long-term current use of insulin ([place]) 01/28/2018  . Retinal detachment of left eye with multiple breaks 11/14/2016  . Morbid obesity ([place]) 08/17/2015  . Essential hypertension 06/01/2014    Gar Ponto MS, PT 09/23/20 6:29 PM  [facility] [address] [place], [place], [address] Phone: [phone]   Fax:  [phone]  Name: LAVELL RIDINGS MRN: [id number] Date of Birth: December 29, 1950

## 2020-09-24 ENCOUNTER — Ambulatory Visit: Payer: Medicare HMO | Admitting: Family

## 2020-09-28 ENCOUNTER — Other Ambulatory Visit: Payer: Self-pay

## 2020-09-28 ENCOUNTER — Ambulatory Visit: Payer: Medicare HMO

## 2020-09-28 DIAGNOSIS — M5442 Lumbago with sciatica, left side: Secondary | ICD-10-CM

## 2020-09-28 DIAGNOSIS — M5441 Lumbago with sciatica, right side: Secondary | ICD-10-CM

## 2020-09-28 DIAGNOSIS — M25552 Pain in left hip: Secondary | ICD-10-CM

## 2020-09-28 DIAGNOSIS — M5386 Other specified dorsopathies, lumbar region: Secondary | ICD-10-CM | POA: Diagnosis not present

## 2020-09-29 ENCOUNTER — Other Ambulatory Visit: Payer: Self-pay | Admitting: Orthopedic Surgery

## 2020-09-29 DIAGNOSIS — G8929 Other chronic pain: Secondary | ICD-10-CM

## 2020-09-29 NOTE — Therapy (Addendum)
[place], [place], [address] Phone: [phone]   Fax:  [phone]  Physical Therapy Treatment/Discharge  Patient Details  Name: Samantha Mcgrath MRN: [id number] Date of Birth: May 01, 1951 Referring Provider (PT): Yvonna Alanis, NP   Encounter Date: 09/28/2020   PT End of Session - 09/29/20 0640     Visit Number 4    Number of Visits 13    Date for PT Re-Evaluation 11/06/20    Authorization Type AETNA MEDICARE HMO/PPO    Progress Note Due on Visit 10    PT Start Time 1703    PT Stop Time 1751    PT Time Calculation (min) 48 min    Activity Tolerance Patient tolerated treatment well    Behavior During Therapy [facility] for tasks assessed/performed             Past Medical History:  Diagnosis Date   Asthma    Bronchitis    Cataract    bilateral   Depression    Diabetes mellitus without complication ([place])    GERD (gastroesophageal reflux disease)    Gout    Hyperlipidemia    Hypertension    Pneumonia    Restrictive airway disease    Retinal detachment    left    Past Surgical History:  Procedure Laterality Date   ANKLE SURGERY     BLADDER SURGERY     EYE SURGERY     MELANOMA EXCISION     Of Right Leg   TONSILLECTOMY     TUBAL LIGATION      There were no vitals filed for this visit.   Subjective Assessment - 09/28/20 1708     Subjective Pt reports having some pain this weekend which included her legs.  Pt rates at 5/10 level during the weekend, and currently she rates it  1/10. Pt is not aware of a reason her back was bothering her more this weekend.    Diagnostic tests 08/13/20:   FINDINGS:  No acute fracture or dislocation. No focal osseous lesion. Mild  degenerative sclerosis of the left sacroiliac joint. Eccentric right  degenerative disc disease at L4-5.     IMPRESSION:  No acute osseous abnormality.    Patient Stated Goals For pain to improve.    Currently in Pain? Yes    Pain  Score 1     Pain Location Back    Pain Orientation Left;Posterior;Lower    Pain Descriptors / Indicators Aching    Pain Type Chronic pain    Pain Onset More than a month ago    Pain Frequency Intermittent                               [facility] Adult PT Treatment/Exercise - 09/29/20 0001       Lumbar Exercises: Stretches   Lower Trunk Rotation 5 reps;10 seconds    Lower Trunk Rotation Limitations decreased rotation R    Other Lumbar Stretch Exercise Seated trunk flexion forward, then laterally L and R; 5x; 10 sec      Lumbar Exercises: Standing   Row Both;10 reps   2 sets   Theraband Level (Row) Level 3 (Green)    Shoulder Extension Both;10 reps   2 sets   Theraband Level (Shoulder Extension) Level 3 (Green)      Manual Therapy   Manual Therapy Soft tissue mobilization;Joint mobilization    Joint Mobilization UPAs  to the R L1-L5 TPs    Soft tissue mobilization STM to the L lumbar paraspinals                    PT Education - 09/29/20 8832     Education Details Updated HEP    Person(s) Educated Patient    Methods Explanation;Demonstration;Tactile cues;Verbal cues;Handout    Comprehension Verbalized understanding;Returned demonstration;Verbal cues required;Tactile cues required              PT Short Term Goals - 09/15/20 1608       PT SHORT TERM GOAL #1   Title Pt will be Ind in an initial HEP.    Baseline Started on eval    Status New    Target Date 10/06/20      PT SHORT TERM GOAL #2   Title Pt will voice understanding of measures to assist in the redution of pain.    Status New    Target Date 10/06/20               PT Long Term Goals - 09/15/20 1813       PT LONG TERM GOAL #1   Title Pt will be Ind in a final HEP to maintain or progress achieved level of function    Status New    Target Date 11/06/20      PT LONG TERM GOAL #2   Title Increase pt's trunk ext ROM to 25% limited as indication of improved low back pain  and to improve standing tolerance.    Baseline 75% limited    Status New    Target Date 11/06/20      PT LONG TERM GOAL #3   Title Pt will report a decrease in her low back pain range to 0-5/10 with daily activities at home and at work    Baseline 0-10/10    Status New    Target Date 11/06/20      PT LONG TERM GOAL #4   Title Pt's FOTO score will improve to the predicted value of 62% functional ability    Baseline 49% functional ability    Status New    Target Date 11/06/20                   Plan - 09/29/20 0640     Clinical Impression Statement PT was continued for lumbopelvic ROM with a flexion bias and strengthening. PT was provided for STM to the L lumbar paraspinals and for UPA s the R lumbar vertebrae. LTR ex and a additional back strengthening exs were completed and added to HEP. After PT, pt reported her L low back pain remained at a 1/10. Trunk rotation L is decreased in comparison to R.    Personal Factors and Comorbidities Profession;Time since onset of injury/illness/exacerbation    Examination-Activity Limitations Stand;Sit    Examination-Participation Restrictions Occupation    Stability/Clinical Decision Making Stable/Uncomplicated    Clinical Decision Making Low    Rehab Potential Good    PT Frequency 2x / week    PT Duration 6 weeks    PT Treatment/Interventions ADLs/Self Care Home Management;Cryotherapy;Electrical Stimulation;Iontophoresis 59m/ml Dexamethasone;Moist Heat;Traction;Ultrasound;Therapeutic exercise;Therapeutic activities;Patient/family education;Manual techniques;Dry needling;Taping;Spinal Manipulations    PT Next Visit Plan Assess response to progressed HEP, progress ther ex as indicated, use modalities and manual techniques as indicated    PT Home Exercise Plan [id number]    Consulted and Agree with Plan of Care Patient  Patient will benefit from skilled therapeutic intervention in order to improve the following deficits and  impairments:  Decreased range of motion,Obesity,Decreased activity tolerance,Pain,Decreased strength,Postural dysfunction  Visit Diagnosis: Acute midline low back pain with bilateral sciatica  Acute hip pain, left  Decreased ROM of lumbar spine     Problem List Patient Active Problem List   Diagnosis Date Noted   Stage 3b chronic kidney disease ([place]) 10/08/2019   Anxiety 07/08/2019   Combined forms of age-related cataract of right eye 08/15/2018   Gastroesophageal reflux disease without esophagitis 01/29/2018   Asthma 01/28/2018   Hyperlipidemia 01/28/2018   Type 2 diabetes mellitus without complication, with long-term current use of insulin ([place]) 01/28/2018   Retinal detachment of left eye with multiple breaks 11/14/2016   Morbid obesity ([place]) 08/17/2015   Essential hypertension 06/01/2014    Gar Ponto MS, PT 09/29/20 6:52 AM  [facility] [address] [place], [place], [address] Phone: [phone]   Fax:  [phone]  Name: VALENCIA KASSA MRN: [id number] Date of Birth: 1951-04-28  PHYSICAL THERAPY DISCHARGE SUMMARY  Visits from Start of Care: 4  Current functional level related to goals / functional outcomes: See above  Remaining deficits: See above   Education / Equipment: HEP  Patient agrees to discharge. Patient goals were partially met. Patient is being discharged due to not returning since the last visit.  Maezie Justin MS, PT 01/05/21 11:29 AM

## 2020-09-29 NOTE — Telephone Encounter (Signed)
Patient has request refill on medication "Tizanidine 4mg ". Last refill for medication was 06/29/2020 with 30 tablets to be taken daily as needed and 1 refill. Patient is due for refill. I couldn't send medication in because of warning. Medication pend and sent to Sherrie Mustache, NP due to PCP [place], Nelda Bucks, NP being out of office.

## 2020-09-30 ENCOUNTER — Encounter: Payer: Self-pay | Admitting: Podiatry

## 2020-09-30 ENCOUNTER — Other Ambulatory Visit: Payer: Self-pay

## 2020-09-30 ENCOUNTER — Other Ambulatory Visit: Payer: Self-pay | Admitting: Podiatry

## 2020-09-30 ENCOUNTER — Ambulatory Visit: Payer: Medicare HMO | Admitting: Podiatry

## 2020-09-30 ENCOUNTER — Ambulatory Visit: Payer: Medicare HMO

## 2020-09-30 ENCOUNTER — Ambulatory Visit (INDEPENDENT_AMBULATORY_CARE_PROVIDER_SITE_OTHER): Payer: Medicare HMO

## 2020-09-30 DIAGNOSIS — L02611 Cutaneous abscess of right foot: Secondary | ICD-10-CM | POA: Diagnosis not present

## 2020-09-30 DIAGNOSIS — L03031 Cellulitis of right toe: Secondary | ICD-10-CM | POA: Diagnosis not present

## 2020-09-30 DIAGNOSIS — M79672 Pain in left foot: Secondary | ICD-10-CM | POA: Diagnosis not present

## 2020-09-30 DIAGNOSIS — M79671 Pain in right foot: Secondary | ICD-10-CM

## 2020-09-30 DIAGNOSIS — M1A0710 Idiopathic chronic gout, right ankle and foot, without tophus (tophi): Secondary | ICD-10-CM | POA: Diagnosis not present

## 2020-09-30 DIAGNOSIS — M869 Osteomyelitis, unspecified: Secondary | ICD-10-CM | POA: Diagnosis not present

## 2020-09-30 MED ORDER — DOXYCYCLINE HYCLATE 100 MG PO TABS
100.0000 mg | ORAL_TABLET | Freq: Two times a day (BID) | ORAL | 1 refills | Status: DC
Start: 2020-09-30 — End: 2020-10-09

## 2020-09-30 NOTE — Patient Instructions (Signed)

## 2020-09-30 NOTE — Progress Notes (Signed)
Subjective:   Patient ID: Samantha Mcgrath, female   DOB: 70 y.o.   MRN: [id number]   HPI Patient presents stating that the second toe right has been swollen and painful for about 5 or 6 months and patient states there is been drainage.  Saw another doctor who did not do anything and states its been getting sore and the patient cannot wear closed in shoes.  Patient has diabetes under reasonably good control has some swelling of the left joint but nowhere near the degree of the right and does not smoke likes to be active   Review of Systems  All other systems reviewed and are negative.       Objective:  Physical Exam Vitals and nursing note reviewed.  Constitutional:      Appearance: She is well-developed.  Pulmonary:     Effort: Pulmonary effort is normal.  Musculoskeletal:        General: Normal range of motion.  Skin:    General: Skin is warm.  Neurological:     Mental Status: She is alert.     Neurovascular status intact muscle strength was found to be adequate range of motion adequate.  Patient does have redness of the inner phalangeal joint distal digit to left which appears to be gouty and the second digit right is red also distal phalangeal joint spreading slightly more proximal and the nailbed is quite damaged with what appears to be drainage and abscess formation.  Patient has good digital perfusion well oriented x3     Assessment:  Possibility for gout of the distal second MPJ left and right with possibility right of infection with osteomyelitic changes and cellulitis abscess formation localized     Plan:  H&P x-ray right discussed and left and education concerning condition rendered.  Today I anesthetized the right second digit using sterile instrumentation I remove the entire nail bed both medial lateral central I flushed the bed after doing cultures and did not note bone exposure.  I applied sterile dressing placed on doxycycline explained the strong possibility for  amputation of the digit and we will see back in 2 weeks to make determination encouraged her to call if any issues were to occur of any systemic signs of infection were to occur she is to go straight to the emergency room  X-rays indicate there is been significant osteolysis of the distal middle phalanx digit to right foot with no significant changes on the left second digit

## 2020-10-01 DIAGNOSIS — M869 Osteomyelitis, unspecified: Secondary | ICD-10-CM | POA: Diagnosis not present

## 2020-10-03 LAB — WOUND CULTURE
MICRO NUMBER:: 11826811
SPECIMEN QUALITY:: ADEQUATE

## 2020-10-03 LAB — HOUSE ACCOUNT TRACKING

## 2020-10-04 ENCOUNTER — Ambulatory Visit: Payer: Medicare HMO | Admitting: Podiatry

## 2020-10-04 ENCOUNTER — Ambulatory Visit (INDEPENDENT_AMBULATORY_CARE_PROVIDER_SITE_OTHER): Payer: Medicare HMO

## 2020-10-04 ENCOUNTER — Encounter (HOSPITAL_COMMUNITY): Payer: Self-pay

## 2020-10-04 ENCOUNTER — Encounter: Payer: Self-pay | Admitting: Podiatry

## 2020-10-04 ENCOUNTER — Telehealth: Payer: Self-pay | Admitting: *Deleted

## 2020-10-04 ENCOUNTER — Inpatient Hospital Stay (HOSPITAL_COMMUNITY)
Admission: EM | Admit: 2020-10-04 | Discharge: 2020-10-09 | DRG: 617 | Disposition: A | Discharge status: Home Health | Payer: Medicare HMO | Attending: Internal Medicine | Admitting: Internal Medicine

## 2020-10-04 ENCOUNTER — Other Ambulatory Visit: Payer: Self-pay

## 2020-10-04 VITALS — Temp 97.3°F

## 2020-10-04 DIAGNOSIS — L02611 Cutaneous abscess of right foot: Secondary | ICD-10-CM

## 2020-10-04 DIAGNOSIS — M7989 Other specified soft tissue disorders: Secondary | ICD-10-CM | POA: Diagnosis present

## 2020-10-04 DIAGNOSIS — L03031 Cellulitis of right toe: Secondary | ICD-10-CM | POA: Diagnosis not present

## 2020-10-04 DIAGNOSIS — M545 Low back pain, unspecified: Secondary | ICD-10-CM | POA: Diagnosis present

## 2020-10-04 DIAGNOSIS — M479 Spondylosis, unspecified: Secondary | ICD-10-CM | POA: Diagnosis present

## 2020-10-04 DIAGNOSIS — Z888 Allergy status to other drugs, medicaments and biological substances status: Secondary | ICD-10-CM

## 2020-10-04 DIAGNOSIS — F32A Depression, unspecified: Secondary | ICD-10-CM | POA: Diagnosis present

## 2020-10-04 DIAGNOSIS — F419 Anxiety disorder, unspecified: Secondary | ICD-10-CM | POA: Diagnosis present

## 2020-10-04 DIAGNOSIS — M19071 Primary osteoarthritis, right ankle and foot: Secondary | ICD-10-CM | POA: Diagnosis not present

## 2020-10-04 DIAGNOSIS — Z794 Long term (current) use of insulin: Secondary | ICD-10-CM | POA: Diagnosis not present

## 2020-10-04 DIAGNOSIS — M1A9XX1 Chronic gout, unspecified, with tophus (tophi): Secondary | ICD-10-CM | POA: Diagnosis not present

## 2020-10-04 DIAGNOSIS — N1831 Chronic kidney disease, stage 3a: Secondary | ICD-10-CM | POA: Diagnosis not present

## 2020-10-04 DIAGNOSIS — N179 Acute kidney failure, unspecified: Secondary | ICD-10-CM | POA: Diagnosis present

## 2020-10-04 DIAGNOSIS — Z20822 Contact with and (suspected) exposure to covid-19: Secondary | ICD-10-CM | POA: Diagnosis present

## 2020-10-04 DIAGNOSIS — Z79899 Other long term (current) drug therapy: Secondary | ICD-10-CM

## 2020-10-04 DIAGNOSIS — M869 Osteomyelitis, unspecified: Secondary | ICD-10-CM | POA: Diagnosis present

## 2020-10-04 DIAGNOSIS — G8929 Other chronic pain: Secondary | ICD-10-CM | POA: Diagnosis present

## 2020-10-04 DIAGNOSIS — M1712 Unilateral primary osteoarthritis, left knee: Secondary | ICD-10-CM | POA: Diagnosis present

## 2020-10-04 DIAGNOSIS — E1122 Type 2 diabetes mellitus with diabetic chronic kidney disease: Secondary | ICD-10-CM | POA: Diagnosis present

## 2020-10-04 DIAGNOSIS — K219 Gastro-esophageal reflux disease without esophagitis: Secondary | ICD-10-CM | POA: Diagnosis present

## 2020-10-04 DIAGNOSIS — S98131A Complete traumatic amputation of one right lesser toe, initial encounter: Secondary | ICD-10-CM | POA: Diagnosis not present

## 2020-10-04 DIAGNOSIS — Z85820 Personal history of malignant melanoma of skin: Secondary | ICD-10-CM

## 2020-10-04 DIAGNOSIS — E876 Hypokalemia: Secondary | ICD-10-CM | POA: Diagnosis present

## 2020-10-04 DIAGNOSIS — E119 Type 2 diabetes mellitus without complications: Secondary | ICD-10-CM

## 2020-10-04 DIAGNOSIS — E785 Hyperlipidemia, unspecified: Secondary | ICD-10-CM | POA: Diagnosis present

## 2020-10-04 DIAGNOSIS — M86171 Other acute osteomyelitis, right ankle and foot: Secondary | ICD-10-CM | POA: Diagnosis not present

## 2020-10-04 DIAGNOSIS — I10 Essential (primary) hypertension: Secondary | ICD-10-CM | POA: Diagnosis present

## 2020-10-04 DIAGNOSIS — Z66 Do not resuscitate: Secondary | ICD-10-CM | POA: Diagnosis not present

## 2020-10-04 DIAGNOSIS — M25562 Pain in left knee: Secondary | ICD-10-CM | POA: Diagnosis not present

## 2020-10-04 DIAGNOSIS — Z89421 Acquired absence of other right toe(s): Secondary | ICD-10-CM

## 2020-10-04 DIAGNOSIS — I129 Hypertensive chronic kidney disease with stage 1 through stage 4 chronic kidney disease, or unspecified chronic kidney disease: Secondary | ICD-10-CM | POA: Diagnosis present

## 2020-10-04 DIAGNOSIS — R609 Edema, unspecified: Secondary | ICD-10-CM | POA: Diagnosis present

## 2020-10-04 DIAGNOSIS — J45909 Unspecified asthma, uncomplicated: Secondary | ICD-10-CM | POA: Diagnosis present

## 2020-10-04 DIAGNOSIS — Z7984 Long term (current) use of oral hypoglycemic drugs: Secondary | ICD-10-CM | POA: Diagnosis not present

## 2020-10-04 DIAGNOSIS — E1169 Type 2 diabetes mellitus with other specified complication: Principal | ICD-10-CM | POA: Diagnosis present

## 2020-10-04 DIAGNOSIS — Z9851 Tubal ligation status: Secondary | ICD-10-CM | POA: Diagnosis not present

## 2020-10-04 DIAGNOSIS — M86671 Other chronic osteomyelitis, right ankle and foot: Secondary | ICD-10-CM | POA: Diagnosis not present

## 2020-10-04 DIAGNOSIS — N1832 Chronic kidney disease, stage 3b: Secondary | ICD-10-CM | POA: Diagnosis present

## 2020-10-04 DIAGNOSIS — M7731 Calcaneal spur, right foot: Secondary | ICD-10-CM | POA: Diagnosis not present

## 2020-10-04 DIAGNOSIS — E669 Obesity, unspecified: Secondary | ICD-10-CM | POA: Diagnosis present

## 2020-10-04 DIAGNOSIS — Z6837 Body mass index (BMI) 37.0-37.9, adult: Secondary | ICD-10-CM

## 2020-10-04 DIAGNOSIS — R69 Illness, unspecified: Secondary | ICD-10-CM | POA: Diagnosis not present

## 2020-10-04 DIAGNOSIS — R52 Pain, unspecified: Secondary | ICD-10-CM

## 2020-10-04 DIAGNOSIS — M86679 Other chronic osteomyelitis, unspecified ankle and foot: Secondary | ICD-10-CM | POA: Diagnosis not present

## 2020-10-04 DIAGNOSIS — I96 Gangrene, not elsewhere classified: Secondary | ICD-10-CM | POA: Diagnosis not present

## 2020-10-04 DIAGNOSIS — L0390 Cellulitis, unspecified: Secondary | ICD-10-CM | POA: Diagnosis not present

## 2020-10-04 LAB — COMPREHENSIVE METABOLIC PANEL
ALT: 16 U/L (ref 0–44)
AST: 20 U/L (ref 15–41)
Albumin: 3.9 g/dL (ref 3.5–5.0)
Alkaline Phosphatase: 85 U/L (ref 38–126)
Anion gap: 14 (ref 5–15)
BUN: 34 mg/dL — ABNORMAL HIGH (ref 8–23)
CO2: 27 mmol/L (ref 22–32)
Calcium: 9.7 mg/dL (ref 8.9–10.3)
Chloride: 93 mmol/L — ABNORMAL LOW (ref 98–111)
Creatinine, Ser: 1.2 mg/dL — ABNORMAL HIGH (ref 0.44–1.00)
GFR, Estimated: 48 mL/min — ABNORMAL LOW (ref >60)
Glucose, Bld: 94 mg/dL (ref 70–99)
Potassium: 3 mmol/L — ABNORMAL LOW (ref 3.5–5.1)
Sodium: 134 mmol/L — ABNORMAL LOW (ref 135–145)
Total Bilirubin: 0.6 mg/dL (ref 0.3–1.2)
Total Protein: 8.4 g/dL — ABNORMAL HIGH (ref 6.5–8.1)

## 2020-10-04 LAB — CBC WITH DIFFERENTIAL/PLATELET
Abs Immature Granulocytes: 0.11 10*3/uL — ABNORMAL HIGH (ref 0.00–0.07)
Basophils Absolute: 0.1 10*3/uL (ref 0.0–0.1)
Basophils Relative: 1 %
Eosinophils Absolute: 0.2 10*3/uL (ref 0.0–0.5)
Eosinophils Relative: 2 %
HCT: 40.1 % (ref 36.0–46.0)
Hemoglobin: 12.6 g/dL (ref 12.0–15.0)
Immature Granulocytes: 1 %
Lymphocytes Relative: 15 %
Lymphs Abs: 2.1 10*3/uL (ref 0.7–4.0)
MCH: 26.3 pg (ref 26.0–34.0)
MCHC: 31.4 g/dL (ref 30.0–36.0)
MCV: 83.7 fL (ref 80.0–100.0)
Monocytes Absolute: 1.4 10*3/uL — ABNORMAL HIGH (ref 0.1–1.0)
Monocytes Relative: 10 %
Neutro Abs: 10.2 10*3/uL — ABNORMAL HIGH (ref 1.7–7.7)
Neutrophils Relative %: 71 %
Platelets: 374 10*3/uL (ref 150–400)
RBC: 4.79 MIL/uL (ref 3.87–5.11)
RDW: 15.9 % — ABNORMAL HIGH (ref 11.5–15.5)
WBC: 14.1 10*3/uL — ABNORMAL HIGH (ref 4.0–10.5)
nRBC: 0 % (ref 0.0–0.2)

## 2020-10-04 LAB — RESP PANEL BY RT-PCR (FLU A&B, COVID) ARPGX2
Influenza A by PCR: NEGATIVE
Influenza B by PCR: NEGATIVE
SARS Coronavirus 2 by RT PCR: NEGATIVE

## 2020-10-04 LAB — LACTIC ACID, PLASMA
Lactic Acid, Venous: 1.4 mmol/L (ref 0.5–1.9)
Lactic Acid, Venous: 1.1 mmol/L (ref 0.5–1.9)

## 2020-10-04 LAB — CBG MONITORING, ED: Glucose-Capillary: 78 mg/dL (ref 70–99)

## 2020-10-04 MED ORDER — ONDANSETRON HCL 4 MG PO TABS: 4.0000 mg | ORAL_TABLET | Freq: Four times a day (QID) | ORAL | Status: DC | PRN

## 2020-10-04 MED ORDER — PIPERACILLIN-TAZOBACTAM 3.375 G IVPB 30 MIN
3.3750 g | Freq: Once | INTRAVENOUS | Status: AC
  Administered 2020-10-04: 3.375 g via INTRAVENOUS
  Filled 2020-10-04: qty 50

## 2020-10-04 MED ORDER — HYDROMORPHONE HCL 1 MG/ML IJ SOLN
0.5000 mg | Freq: Once | INTRAMUSCULAR | Status: AC
  Administered 2020-10-04: 0.5 mg via INTRAVENOUS
  Filled 2020-10-04: qty 1

## 2020-10-04 MED ORDER — MONTELUKAST SODIUM 10 MG PO TABS
10.0000 mg | ORAL_TABLET | Freq: Every day | ORAL | Status: DC
  Administered 2020-10-04 – 2020-10-08 (×5): 10 mg via ORAL
  Filled 2020-10-04 (×5): qty 1

## 2020-10-04 MED ORDER — HYDROMORPHONE HCL 1 MG/ML IJ SOLN
0.5000 mg | INTRAMUSCULAR | Status: DC | PRN
  Administered 2020-10-05 – 2020-10-07 (×11): 0.5 mg via INTRAVENOUS
  Filled 2020-10-04 (×11): qty 0.5
  Filled 2020-10-04: qty 1

## 2020-10-04 MED ORDER — ATORVASTATIN CALCIUM 10 MG PO TABS
10.0000 mg | ORAL_TABLET | Freq: Every day | ORAL | Status: DC
  Administered 2020-10-05 – 2020-10-09 (×4): 10 mg via ORAL
  Filled 2020-10-04 (×5): qty 1

## 2020-10-04 MED ORDER — TRAMADOL HCL 50 MG PO TABS
50.0000 mg | ORAL_TABLET | Freq: Four times a day (QID) | ORAL | Status: DC | PRN
  Administered 2020-10-05 – 2020-10-06 (×2): 50 mg via ORAL
  Filled 2020-10-04 (×2): qty 1

## 2020-10-04 MED ORDER — SODIUM CHLORIDE 0.9 % IV SOLN: INTRAVENOUS | Status: AC

## 2020-10-04 MED ORDER — CHLORTHALIDONE 25 MG PO TABS
25.0000 mg | ORAL_TABLET | Freq: Every day | ORAL | Status: DC
  Administered 2020-10-05 – 2020-10-07 (×2): 25 mg via ORAL
  Filled 2020-10-04 (×4): qty 1

## 2020-10-04 MED ORDER — INSULIN ASPART 100 UNIT/ML IJ SOLN
0.0000 [IU] | Freq: Every day | INTRAMUSCULAR | Status: DC
  Filled 2020-10-04: qty 0.05

## 2020-10-04 MED ORDER — ONDANSETRON HCL 4 MG/2ML IJ SOLN
4.0000 mg | Freq: Four times a day (QID) | INTRAMUSCULAR | Status: DC | PRN
  Administered 2020-10-05: 4 mg via INTRAVENOUS
  Filled 2020-10-04: qty 2

## 2020-10-04 MED ORDER — HEPARIN SODIUM (PORCINE) 5000 UNIT/ML IJ SOLN
5000.0000 [IU] | Freq: Three times a day (TID) | INTRAMUSCULAR | Status: DC
  Administered 2020-10-04 – 2020-10-08 (×10): 5000 [IU] via SUBCUTANEOUS
  Filled 2020-10-04 (×10): qty 1

## 2020-10-04 MED ORDER — PIPERACILLIN-TAZOBACTAM 3.375 G IVPB 30 MIN: 3.3750 g | Freq: Two times a day (BID) | INTRAVENOUS | Status: DC

## 2020-10-04 MED ORDER — PANTOPRAZOLE SODIUM 40 MG PO TBEC
40.0000 mg | DELAYED_RELEASE_TABLET | Freq: Two times a day (BID) | ORAL | Status: DC
  Administered 2020-10-04 – 2020-10-09 (×9): 40 mg via ORAL
  Filled 2020-10-04 (×10): qty 1

## 2020-10-04 MED ORDER — INSULIN ASPART 100 UNIT/ML IJ SOLN
0.0000 [IU] | Freq: Three times a day (TID) | INTRAMUSCULAR | Status: DC
  Administered 2020-10-05 – 2020-10-09 (×9): 2 [IU] via SUBCUTANEOUS
  Filled 2020-10-04: qty 0.15

## 2020-10-04 MED ORDER — ACETAMINOPHEN 650 MG RE SUPP: 650.0000 mg | Freq: Four times a day (QID) | RECTAL | Status: DC | PRN

## 2020-10-04 MED ORDER — ACETAMINOPHEN 325 MG PO TABS
650.0000 mg | ORAL_TABLET | Freq: Four times a day (QID) | ORAL | Status: DC | PRN
  Administered 2020-10-06: 650 mg via ORAL
  Filled 2020-10-04 (×2): qty 2

## 2020-10-04 MED ORDER — HYDRALAZINE HCL 20 MG/ML IJ SOLN: 10.0000 mg | Freq: Four times a day (QID) | INTRAMUSCULAR | Status: DC | PRN

## 2020-10-04 NOTE — H&P (Signed)
History and Physical    Samantha Mcgrath [id number] DOB: 05-25-1951 DOA: 10/04/2020  PCP: Sandrea Hughs, NP    Patient coming from:  Home   Chief Complaint:  Rt 2 MTP OM.   HPI: Samantha Mcgrath is a 69 y.o. female seen in ed with complaints of rt 2ND MTP pain and swelling and erythema since about two weeks and progressively getting worse.Pt was seen by podiatry and treated with doxycycline which pt finished 4 days and had n/v .When her leg has been red and swollen and pt was confused per daughter they came to er.Daughter is coming from [place].Pt has been having fevers x 3 days and also back pain that has worsened over past few days, it started about few weeks ago as well.  Pt has past medical history of DM/ HTN/ CKD/GERD. ED Course:  Vitals:   10/04/20 2015 10/04/20 2030 10/04/20 2045 10/04/20 2100  BP: (!) 165/75 (!) 153/62 140/62 (!) 157/71  Pulse: 78 73 72 78  Resp: 20 18 20 15   Temp:      TempSrc:      SpO2: 99% 97% 94% 96%  Weight:      Height:      In ed pt is afebrile, hypertensive, fever at home give hpi. Labs show leucocytosis with wbc count of 14.1,CMp shows Hypokalemia and AKI of 1.20 her ckd was resolved.  Xray done at podiatry office we do not have reports but Was told OM on xray.  Review of Systems:  Review of Systems  Constitutional: Negative.   HENT: Negative.   Eyes: Negative.   Respiratory: Negative.   Cardiovascular: Negative.   Gastrointestinal: Negative.   Genitourinary: Negative.   Musculoskeletal: Positive for falls and joint pain.  Skin: Negative.   Neurological: Positive for dizziness.     Past Medical History:  Diagnosis Date  . Asthma   . Bronchitis   . Cataract    bilateral  . Depression   . Diabetes mellitus without complication ([place])   . GERD (gastroesophageal reflux disease)   . Gout   . Hyperlipidemia   . Hypertension   . Pneumonia   . Restrictive airway disease   . Retinal detachment    left    Past Surgical  History:  Procedure Laterality Date  . ANKLE SURGERY    . BLADDER SURGERY    . EYE SURGERY    . MELANOMA EXCISION     Of Right Leg  . TONSILLECTOMY    . TUBAL LIGATION       reports that she has never smoked. She has never used smokeless tobacco. She reports current alcohol use. She reports that she does not use drugs.  Allergies  Allergen Reactions  . Procaine Other (See Comments) and Palpitations    ALSO BLISTERS WITH TOPICAL Novacaine   Novocain. ALSO BLISTERS WITH TOPICAL    History reviewed. No pertinent family history.  Prior to Admission medications   Medication Sig Start Date End Date Taking? Authorizing Provider  atorvastatin (LIPITOR) 10 MG tablet Take 10 mg by mouth daily. 05/12/19   [provider]  baclofen (LIORESAL) 10 MG tablet Take 0.5-1 tablets (5-10 mg total) by mouth 3 (three) times daily as needed for muscle spasms. 09/01/20   Hilts, Legrand Como, MD  celecoxib (CELEBREX) 100 MG capsule Take 1 capsule (100 mg total) by mouth 2 (two) times daily as needed. 09/01/20   Hilts, Legrand Como, MD  chlorthalidone (HYGROTON) 50 MG tablet Take 50 mg by mouth  daily. 05/12/19   [provider]  Cholecalciferol (VITAMIN D3) 250 MCG (10000 UT) capsule Take 10,000 Units by mouth daily.    [provider]  doxycycline (VIBRA-TABS) 100 MG tablet Take 1 tablet (100 mg total) by mouth 2 (two) times daily. 09/30/20   Wallene Huh, DPM  gabapentin (NEURONTIN) 100 MG capsule Take 1 capsule (100 mg total) by mouth 2 (two) times daily. 08/30/20   Ngetich, Dinah C, NP  glipiZIDE (GLUCOTROL XL) 5 MG 24 hr tablet Take 5 mg by mouth at bedtime. 04/16/19   [provider]  losartan (COZAAR) 25 MG tablet Take 1 tablet (25 mg total) by mouth daily. 09/13/20   Ngetich, Dinah C, NP  metFORMIN (GLUCOPHAGE-XR) 500 MG 24 hr tablet TAKE TWO TABLETS BY MOUTH TWICE A DAY 08/26/16   [provider]  montelukast (SINGULAIR) 10 MG tablet Take 10 mg by mouth at bedtime.  11/09/16   [provider]  pantoprazole (PROTONIX) 40 MG tablet Take 40 mg by mouth 2 (two) times daily. 07/30/18   [provider]  tiZANidine (ZANAFLEX) 4 MG tablet Take 1 tablet (4 mg total) by mouth daily as needed. 06/29/20   Fargo, Amy E, NP  traMADol (ULTRAM) 50 MG tablet Take 1 tablet (50 mg total) by mouth every 6 (six) hours as needed. 09/01/20   Hilts, Legrand Como, MD    Physical Exam: Vitals:   10/04/20 2015 10/04/20 2030 10/04/20 2045 10/04/20 2100  BP: (!) 165/75 (!) 153/62 140/62 (!) 157/71  Pulse: 78 73 72 78  Resp: 20 18 20 15   Temp:      TempSrc:      SpO2: 99% 97% 94% 96%  Weight:      Height:       Physical Exam Vitals and nursing note reviewed.  Constitutional:      General: She is not in acute distress.    Appearance: Normal appearance. She is obese. She is not ill-appearing.  HENT:     Head: Normocephalic and atraumatic.     Right Ear: External ear normal.     Left Ear: External ear normal.     Nose: Nose normal.     Mouth/Throat:     Mouth: Mucous membranes are moist.  Eyes:     Extraocular Movements: Extraocular movements intact.     Pupils: Pupils are equal, round, and reactive to light.  Cardiovascular:     Rate and Rhythm: Normal rate and regular rhythm.     Pulses: Normal pulses.          Dorsalis pedis pulses are 2+ on the right side and 2+ on the left side.       Posterior tibial pulses are 2+ on the right side and 2+ on the left side.     Heart sounds: Normal heart sounds.  Pulmonary:     Effort: Pulmonary effort is normal.     Breath sounds: Normal breath sounds.  Abdominal:     General: Bowel sounds are normal. There is no distension.     Palpations: Abdomen is soft. There is no mass.     Tenderness: There is no abdominal tenderness. There is no guarding.     Hernia: No hernia is present.  Musculoskeletal:        General: Tenderness present. No signs of injury.     Right lower leg: No edema.     Left lower leg: Edema  present.       Feet:  Feet:  Right foot:     Skin integrity: Ulcer, erythema and warmth present. No blister.     Comments: plz see media image.  Neurological:     General: No focal deficit present.     Mental Status: She is alert and oriented to person, place, and time.  Psychiatric:        Mood and Affect: Mood normal.        Behavior: Behavior normal.    Labs on Admission: I have personally reviewed following labs and imaging studies  No results for input(s): CKTOTAL, CKMB, TROPONINI in the last 72 hours. Lab Results  Component Value Date   WBC 14.1 (H) 10/04/2020   HGB 12.6 10/04/2020   HCT 40.1 10/04/2020   MCV 83.7 10/04/2020   PLT 374 10/04/2020    Recent Labs  Lab 10/04/20 1909  NA 134*  K 3.0*  CL 93*  CO2 27  BUN 34*  CREATININE 1.20*  CALCIUM 9.7  PROT 8.4*  BILITOT 0.6  ALKPHOS 85  ALT 16  AST 20  GLUCOSE 94   Lab Results  Component Value Date   CHOL 202 (H) 06/29/2020   HDL 63 06/29/2020   LDLCALC 114 (H) 06/29/2020   TRIG 141 06/29/2020   No results found for: DDIMER Invalid input(s): POCBNP  Urinalysis No results found for: COLORURINE, APPEARANCEUR, LABSPEC, PHURINE, GLUCOSEU, HGBUR, BILIRUBINUR, KETONESUR, PROTEINUR, UROBILINOGEN, NITRITE, LEUKOCYTESUR  COVID-19 Labs  No results for input(s): DDIMER, FERRITIN, LDH, CRP in the last 72 hours.  Lab Results  Component Value Date   [place] NEGATIVE 10/04/2020    Radiological Exams on Admission: No results found.  EKG: Independently reviewed.  None      Assessment/Plan Principal Problem:   Osteomyelitis due to type 2 diabetes mellitus (HCC) Active Problems:   Asthma   Essential hypertension   Gastroesophageal reflux disease without esophagitis   Stage 3b chronic kidney disease (HCC)   Type 2 diabetes mellitus without complication, with long-term current use of insulin (HCC)   OM of right 22 MTP : Pt continued on zosyn and cultures obtained. Podiatry consult per am  team.  Plan of amputation per podiatry.  Plan of iv abx per c/s.  Asthma: Stable cont prn mdi.  HTN: Cont chlorthalidone and hold losartan due to AKI. Heart healthy diet.   GERD: Iv ppi.  CKD: Resolved .  AKI: Lab Results  Component Value Date   CREATININE 1.20 (H) 10/04/2020   CREATININE 0.98 (H) 06/29/2020  cont ivf  And avoid nephrotoxic meds and contrast studies and renally dose all meds.   DMII: SSI/ accuchecks and a1c. Hold home glipizide and metformin to prevent hypoglycemia and AKI.  Low back pain: new since past few months and will consider MRI for discitis eval if inflammatory markers extremely high.   DVT prophylaxis:  Heparin  Code Status:  Full code   Family Communication:  Samantha, Mcgrath (Son)  [phone] (Mobile)   Disposition Plan:  Home   Consults called:  None  Admission status: Inpatient.    Para Skeans MD [facility] [phone] How to contact the [facility] Attending or Consulting provider [place] or covering provider during after hours [place], for this patient.    1. Check the care team in [facility] and look for a) attending/consulting [place] provider listed and b) the [facility] team listed 2. Log into [email or web address] and use [place]'s universal password to access. If you do not have the password, please contact the hospital operator. 3. Locate  the [facility] provider you are looking for under [facility] and page to a number that you can be directly reached. 4. If you still have difficulty reaching the provider, please page the [facility] (Director on Call) for the Hospitalists listed on amion for assistance. [email or web address] Password [facility] 10/04/2020, 10:03 PM

## 2020-10-04 NOTE — Telephone Encounter (Signed)
If still has fever should go to emergency room because of possible infection. I'm happy to see her or she can another Doc tomorrow if not doing well

## 2020-10-04 NOTE — Telephone Encounter (Signed)
Returned call to patient to give message per Dr Paulla Dolly for possible infection,no answer, left Vmessage that she should go to ER is still having a fever or may call back tomorrow for an appointment.

## 2020-10-04 NOTE — ED Triage Notes (Addendum)
Emergency Medicine Provider Triage Evaluation Note  Samantha Mcgrath , a 70 y.o. female  was evaluated in triage.  Pt complains of right 2nd toe infection, sent by [facility] for bone infection.  Review of Systems  Positive: Toe pain, swelling, redness Negative: fever  Physical Exam  BP 137/89 (BP Location: Left Arm)   Pulse 89   Temp 99.6 F (37.6 C) (Oral)   Resp 18   SpO2 100%  Gen:   Awake, no distress   HEENT:  Atraumatic  Resp:  Normal effort  Cardiac:  Normal rate  Abd:   Nondistended, nontender  MSK:   Moves extremities without difficulty, erythema, swelling to distal right foot with discoloration to 2nd toe Neuro:  Speech clear   Medical Decision Making  Medically screening exam initiated at 5:47 PM.  Appropriate orders placed.  Samantha Mcgrath was informed that the remainder of the evaluation will be completed by another provider, this initial triage assessment does not replace that evaluation, and the importance of remaining in the ED until their evaluation is complete.  Clinical Impression    XR done in clinic today.    Tacy Learn, PA-C 10/04/20 1750    Tacy Learn, PA-C 10/04/20 1751

## 2020-10-04 NOTE — ED Triage Notes (Signed)
Patient c/o right foot swelling, redness. Patient states she saw symptoms x 4 days. Patient saw a podiatrist today and was told to come to the ED for IV antibiotics.

## 2020-10-04 NOTE — Telephone Encounter (Signed)
Patient is calling because she has developed a fever up to 102(was sick all weekend) after starting her antibiotic(Doxycycline) after her procedure on Friday. She said that she was fine before taking the medicine.Please advise.

## 2020-10-04 NOTE — ED Provider Notes (Signed)
[facility] DEPT Provider Note   CSN: [id number] Arrival date & time: 10/04/20  1648     History Chief Complaint  Patient presents with  . Foot Swelling    Samantha Mcgrath is a 70 y.o. female.  HPI Patient presents with fever and chills.  Right second toe infection with osteomyelitis.  Has been seen by Dr. Paulla Dolly at [facility].  Had some toenail removed on Thursday.  Started on doxycycline at that time.  States since then has had nausea and vomiting now developing fevers and chills.  Temperature up to 102 at home.  Even on Thursday there was talk that the toe may end up having to be amputated.  Patient is diabetic but states she is pretty well controlled and has had no complications.  Also has had worsening pain in her back.  Has chronic pain but states it is gotten worse.  This pain however is not out of line for what she has had previously.  No numbness or weakness.  States there is some pain in the toe but mostly is throbbing.  Had some confusion on Sunday.  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    Past Medical History:  Diagnosis Date  . Asthma   . Bronchitis   . Cataract    bilateral  . Depression   . Diabetes mellitus without complication ([place])   . GERD (gastroesophageal reflux disease)   . Gout   . Hyperlipidemia   . Hypertension   . Pneumonia   . Restrictive airway disease   . Retinal detachment    left    Patient Active Problem List   Diagnosis Date Noted  . Stage 3b chronic kidney disease ([place]) 10/08/2019  . Anxiety 07/08/2019  . Combined forms of age-related cataract of right eye 08/15/2018  . Gastroesophageal reflux disease without esophagitis 01/29/2018  . Asthma 01/28/2018  . Hyperlipidemia 01/28/2018  . Type 2 diabetes mellitus without complication, with long-term current use of insulin ([place]) 01/28/2018  . Retinal detachment of left eye with multiple  breaks 11/14/2016  . Morbid obesity ([place]) 08/17/2015  . Essential hypertension 06/01/2014    Past Surgical History:  Procedure Laterality Date  . ANKLE SURGERY    . BLADDER SURGERY    . EYE SURGERY    . MELANOMA EXCISION     Of Right Leg  . TONSILLECTOMY    . TUBAL LIGATION       OB History   No obstetric history on file.     History reviewed. No pertinent family history.  Social History   Tobacco Use  . Smoking status: Never Smoker  . Smokeless tobacco: Never Used  Vaping Use  . Vaping Use: Never used  Substance Use Topics  . Alcohol use: Yes    Comment: Social Drinker  . Drug use: Never    Home Medications Prior to Admission medications   Medication Sig Start Date End Date Taking? Authorizing Provider  atorvastatin (LIPITOR) 10 MG tablet Take 10 mg by mouth daily. 05/12/19   [provider]  baclofen (LIORESAL) 10 MG tablet Take 0.5-1 tablets (5-10 mg total) by mouth 3 (three) times daily as needed for muscle spasms. 09/01/20   Hilts, Legrand Como, MD  celecoxib (CELEBREX) 100 MG capsule Take 1 capsule (100 mg total) by mouth 2 (two) times daily as needed. 09/01/20   Hilts, Legrand Como, MD  chlorthalidone (HYGROTON)  50 MG tablet Take 50 mg by mouth daily. 05/12/19   [provider]  Cholecalciferol (VITAMIN D3) 250 MCG (10000 UT) capsule Take 10,000 Units by mouth daily.    [provider]  doxycycline (VIBRA-TABS) 100 MG tablet Take 1 tablet (100 mg total) by mouth 2 (two) times daily. 09/30/20   Wallene Huh, DPM  gabapentin (NEURONTIN) 100 MG capsule Take 1 capsule (100 mg total) by mouth 2 (two) times daily. 08/30/20   Ngetich, Dinah C, NP  glipiZIDE (GLUCOTROL XL) 5 MG 24 hr tablet Take 5 mg by mouth at bedtime. 04/16/19   [provider]  losartan (COZAAR) 25 MG tablet Take 1 tablet (25 mg total) by mouth daily. 09/13/20   Ngetich, Dinah C, NP  metFORMIN (GLUCOPHAGE-XR) 500 MG 24 hr tablet TAKE TWO TABLETS BY MOUTH TWICE A DAY 08/26/16    [provider]  montelukast (SINGULAIR) 10 MG tablet Take 10 mg by mouth at bedtime. 11/09/16   [provider]  pantoprazole (PROTONIX) 40 MG tablet Take 40 mg by mouth 2 (two) times daily. 07/30/18   [provider]  tiZANidine (ZANAFLEX) 4 MG tablet Take 1 tablet (4 mg total) by mouth daily as needed. 06/29/20   Fargo, Amy E, NP  traMADol (ULTRAM) 50 MG tablet Take 1 tablet (50 mg total) by mouth every 6 (six) hours as needed. 09/01/20   Hilts, Michael, MD    Allergies    Procaine  Review of Systems   Review of Systems  Constitutional: Positive for appetite change, chills and fever.  HENT: Negative for congestion.   Respiratory: Negative for shortness of breath.   Cardiovascular: Negative for chest pain.  Gastrointestinal: Positive for nausea and vomiting. Negative for diarrhea.  Genitourinary: Negative for flank pain.  Musculoskeletal: Positive for back pain.  Skin:       Skin changes on toe.  Neurological: Negative for weakness.  Psychiatric/Behavioral: Positive for confusion.    Physical Exam Updated Vital Signs BP (!) 150/77 (BP Location: Right Arm)   Pulse 75   Temp 99.6 F (37.6 C) (Oral)   Resp 18   Ht 5\' 6"  (1.676 m)   Wt 102.1 kg   SpO2 95%   BMI 36.32 kg/m   Physical Exam Vitals and nursing note reviewed.  HENT:     Head: Normocephalic.  Eyes:     Pupils: Pupils are equal, round, and reactive to light.  Cardiovascular:     Rate and Rhythm: Normal rate.  Pulmonary:     Breath sounds: No wheezing or rhonchi.  Abdominal:     Tenderness: There is no abdominal tenderness.  Musculoskeletal:        General: Tenderness present.     Comments: Right second toe with skin changes and swelling distally.  Some edema proximal to this.  Mild erythema.  Strong dorsalis pedis pulse however.  Also some changes of second toe on left foot.  Skin:    General: Skin is warm.     Capillary Refill: Capillary refill takes less than 2 seconds.   Neurological:     Mental Status: She is alert and oriented to person, place, and time.       ED Results / Procedures / Treatments   Labs (all labs ordered are listed, but only abnormal results are displayed) Labs Reviewed  COMPREHENSIVE METABOLIC PANEL - Abnormal; Notable for the following components:      Result Value   Sodium 134 (*)    Potassium 3.0 (*)  Chloride 93 (*)    BUN 34 (*)    Creatinine, Ser 1.20 (*)    Total Protein 8.4 (*)    GFR, Estimated 48 (*)    All other components within normal limits  CBC WITH DIFFERENTIAL/PLATELET - Abnormal; Notable for the following components:   WBC 14.1 (*)    RDW 15.9 (*)    Neutro Abs 10.2 (*)    Monocytes Absolute 1.4 (*)    Abs Immature Granulocytes 0.11 (*)    All other components within normal limits  CULTURE, BLOOD (ROUTINE X 2)  CULTURE, BLOOD (ROUTINE X 2)  RESP PANEL BY RT-PCR (FLU A&B, COVID) ARPGX2  LACTIC ACID, PLASMA  LACTIC ACID, PLASMA    EKG None  Radiology No results found.  Procedures Procedures   Medications Ordered in ED Medications  HYDROmorphone (DILAUDID) injection 0.5 mg (0.5 mg Intravenous Given 10/04/20 2014)    ED Course  I have reviewed the triage vital signs and the nursing notes.  Pertinent labs & imaging results that were available during my care of the patient were reviewed by me and considered in my medical decision making (see chart for details).    MDM Rules/Calculators/A&P                          Patient with osteomyelitis of right second toe.  X-ray reviewed from Thursday done in clinic that showed bony destruction.  Cannot see images from x-ray done today.  Had been on oral antibiotics.  Did not tolerate well.  Also had confusion.  Worsening now with fevers.  Will require admission the hospital for IV antibiotics.  It appears that Dr. Cannon Kettle is covering for [facility].  Will admit to internal medicine.   Final Clinical Impression(s) / ED Diagnoses Final  diagnoses:  Osteomyelitis of right foot, unspecified type [facility])    Rx / DC Orders ED Discharge Orders    None       Davonna Belling, MD 10/04/20 2022

## 2020-10-05 ENCOUNTER — Ambulatory Visit: Payer: Medicare HMO | Admitting: Family Medicine

## 2020-10-05 ENCOUNTER — Telehealth: Payer: Self-pay | Admitting: Podiatry

## 2020-10-05 ENCOUNTER — Ambulatory Visit: Payer: Medicare HMO

## 2020-10-05 DIAGNOSIS — M86679 Other chronic osteomyelitis, unspecified ankle and foot: Secondary | ICD-10-CM

## 2020-10-05 DIAGNOSIS — N1831 Chronic kidney disease, stage 3a: Secondary | ICD-10-CM

## 2020-10-05 LAB — GLUCOSE, CAPILLARY
Glucose-Capillary: 61 mg/dL — ABNORMAL LOW (ref 70–99)
Glucose-Capillary: 131 mg/dL — ABNORMAL HIGH (ref 70–99)
Glucose-Capillary: 103 mg/dL — ABNORMAL HIGH (ref 70–99)

## 2020-10-05 LAB — CBC
HCT: 33.4 % — ABNORMAL LOW (ref 36.0–46.0)
Hemoglobin: 10.8 g/dL — ABNORMAL LOW (ref 12.0–15.0)
MCH: 26.9 pg (ref 26.0–34.0)
MCHC: 32.3 g/dL (ref 30.0–36.0)
MCV: 83.3 fL (ref 80.0–100.0)
Platelets: 315 10*3/uL (ref 150–400)
RBC: 4.01 MIL/uL (ref 3.87–5.11)
RDW: 15.8 % — ABNORMAL HIGH (ref 11.5–15.5)
WBC: 11 10*3/uL — ABNORMAL HIGH (ref 4.0–10.5)
nRBC: 0 % (ref 0.0–0.2)

## 2020-10-05 LAB — COMPREHENSIVE METABOLIC PANEL
ALT: 14 U/L (ref 0–44)
AST: 17 U/L (ref 15–41)
Albumin: 3.2 g/dL — ABNORMAL LOW (ref 3.5–5.0)
Alkaline Phosphatase: 66 U/L (ref 38–126)
Anion gap: 13 (ref 5–15)
BUN: 31 mg/dL — ABNORMAL HIGH (ref 8–23)
CO2: 26 mmol/L (ref 22–32)
Calcium: 9.1 mg/dL (ref 8.9–10.3)
Chloride: 94 mmol/L — ABNORMAL LOW (ref 98–111)
Creatinine, Ser: 1.07 mg/dL — ABNORMAL HIGH (ref 0.44–1.00)
GFR, Estimated: 56 mL/min — ABNORMAL LOW (ref >60)
Glucose, Bld: 90 mg/dL (ref 70–99)
Potassium: 2.9 mmol/L — ABNORMAL LOW (ref 3.5–5.1)
Sodium: 133 mmol/L — ABNORMAL LOW (ref 135–145)
Total Bilirubin: 0.9 mg/dL (ref 0.3–1.2)
Total Protein: 6.9 g/dL (ref 6.5–8.1)

## 2020-10-05 LAB — MAGNESIUM: Magnesium: 1.3 mg/dL — ABNORMAL LOW (ref 1.7–2.4)

## 2020-10-05 LAB — PROTIME-INR
INR: 1.1 (ref 0.8–1.2)
Prothrombin Time: 14.1 seconds (ref 11.4–15.2)

## 2020-10-05 LAB — PHOSPHORUS: Phosphorus: 3.3 mg/dL (ref 2.5–4.6)

## 2020-10-05 LAB — TSH: TSH: 3.274 u[IU]/mL (ref 0.350–4.500)

## 2020-10-05 LAB — HEMOGLOBIN A1C
Hgb A1c MFr Bld: 7.4 % — ABNORMAL HIGH (ref 4.8–5.6)
Mean Plasma Glucose: 165.68 mg/dL

## 2020-10-05 LAB — APTT: aPTT: 44 seconds — ABNORMAL HIGH (ref 24–36)

## 2020-10-05 MED ORDER — LOSARTAN POTASSIUM 50 MG PO TABS
25.0000 mg | ORAL_TABLET | Freq: Every day | ORAL | Status: DC
  Administered 2020-10-05 – 2020-10-09 (×4): 25 mg via ORAL
  Filled 2020-10-05 (×5): qty 1

## 2020-10-05 MED ORDER — PIPERACILLIN-TAZOBACTAM 3.375 G IVPB
3.3750 g | Freq: Three times a day (TID) | INTRAVENOUS | Status: AC
  Administered 2020-10-05 – 2020-10-06 (×6): 3.375 g via INTRAVENOUS
  Filled 2020-10-05 (×7): qty 50

## 2020-10-05 MED ORDER — POTASSIUM CHLORIDE 10 MEQ/100ML IV SOLN
10.0000 meq | INTRAVENOUS | Status: AC
  Administered 2020-10-05 (×2): 10 meq via INTRAVENOUS
  Filled 2020-10-05 (×3): qty 100

## 2020-10-05 NOTE — Progress Notes (Signed)
Progress Note    Samantha Mcgrath   TKW:[id number]  DOB: [birth year]  DOA: 10/04/2020     1  PCP: Sandrea Hughs, NP  CC: fevers, right foot pain  Hospital Course: Samantha Mcgrath is a 70 yo female with PMH DMII, HTN, HLD, depression, asthma who presented to the hospital with worsening pain and swelling in her right foot second toe.  She is followed outpatient by podiatry.  She has been also treated with a course of doxycycline outpatient with no improvement. Due to the nonimprovement and development of fevers at home prior to admission, she presented for further evaluation. She was started on Zosyn on admission and podiatry was consulted for further inpatient evaluation in case of need for toe amputation.   Interval History:  Seen this morning in her room after having just gotten there from the ER.  States that she has been having worsening pain with some swelling and redness in her right foot.  The second toe is more painful than it has been.  She understands the probability of needing amputation is high at this time.  We discussed that podiatry will evaluate later today as well.  ROS: Constitutional: positive for fevers, Respiratory: negative for cough, Cardiovascular: negative for chest pain and Gastrointestinal: negative for abdominal pain  Assessment & Plan:  Osteomyelitis  - Right 2nd digit on foot; followed by podiatry; poor response to outpt treatment and abx course - continue on zosyn - podiatry consulted; high probability of amputation needed which patient understands but defer final decision to podiatry; to be assessed later today - continue pain control  DMII - check A1c = 7.4% - use SSI   CKD3a - patient has history of CKD3a. Baseline creat ~ 0.9 - 1.2, eGFR 48-56  HTN - losartan and chlorthalidone on hold due to AKI - use labetalol or hydralazine PRN  GERD - continue PPI  Asthma - no signs of exacerbation   Low back pain - per patient has been ongoing  recently - may need referral for further outpt workup but right foot infection is priority for now  Old records reviewed in assessment of this patient  Antimicrobials: Zosyn 10/04/20 >> current  DVT prophylaxis: heparin injection 5,000 Units Start: 10/04/20 2230 SCDs Start: 10/04/20 2216   Code Status:   Code Status: Full Code Family Communication:   Disposition Plan: Status is: Inpatient  Remains inpatient appropriate because:Ongoing diagnostic testing needed not appropriate for outpatient work up, IV treatments appropriate due to intensity of illness or inability to take PO and Inpatient level of care appropriate due to severity of illness   Dispo: The patient is from: Home              Anticipated d/c is to: Home              Patient currently is not medically stable to d/c.   Difficult to place patient No      Risk of unplanned readmission score: Unplanned Admission- Pilot do not use: 11.82   Objective: Blood pressure (!) 125/55, pulse 65, temperature 98.2 F (36.8 C), temperature source Oral, resp. rate 18, height 5' 6"  (1.676 m), weight 105.5 kg, SpO2 97 %.  Examination: General appearance: alert, cooperative and no distress Head: Normocephalic, without obvious abnormality, atraumatic Eyes: EOMI Lungs: clear to auscultation bilaterally Heart: regular rate and rhythm and S1, S2 normal Abdomen: soft, NT, ND, BS present Extremities: right foot 2nd digit noted to be bruised, TTP, edematous, indurated to touch;  mild erythema apreciated to mid shin with associated trace edema Skin: mobility and turgor normal Neurologic: Grossly normal  Consultants:   Podiatry  Procedures:   n/a  Data Reviewed: I have personally reviewed following labs and imaging studies Results for orders placed or performed during the hospital encounter of 10/04/20 (from the past 24 hour(s))  Comprehensive metabolic panel     Status: Abnormal   Collection Time: 10/04/20  7:09 PM  Result Value  Ref Range   Sodium 134 (L) 135 - 145 mmol/L   Potassium 3.0 (L) 3.5 - 5.1 mmol/L   Chloride 93 (L) 98 - 111 mmol/L   CO2 27 22 - 32 mmol/L   Glucose, Bld 94 70 - 99 mg/dL   BUN 34 (H) 8 - 23 mg/dL   Creatinine, Ser 1.20 (H) 0.44 - 1.00 mg/dL   Calcium 9.7 8.9 - 10.3 mg/dL   Total Protein 8.4 (H) 6.5 - 8.1 g/dL   Albumin 3.9 3.5 - 5.0 g/dL   AST 20 15 - 41 U/L   ALT 16 0 - 44 U/L   Alkaline Phosphatase 85 38 - 126 U/L   Total Bilirubin 0.6 0.3 - 1.2 mg/dL   GFR, Estimated 48 (L) >60 mL/min   Anion gap 14 5 - 15  Lactic acid, plasma     Status: None   Collection Time: 10/04/20  7:09 PM  Result Value Ref Range   Lactic Acid, Venous 1.4 0.5 - 1.9 mmol/L  CBC with Differential     Status: Abnormal   Collection Time: 10/04/20  7:09 PM  Result Value Ref Range   WBC 14.1 (H) 4.0 - 10.5 K/uL   RBC 4.79 3.87 - 5.11 MIL/uL   Hemoglobin 12.6 12.0 - 15.0 g/dL   HCT 40.1 36.0 - 46.0 %   MCV 83.7 80.0 - 100.0 fL   MCH 26.3 26.0 - 34.0 pg   MCHC 31.4 30.0 - 36.0 g/dL   RDW 15.9 (H) 11.5 - 15.5 %   Platelets 374 150 - 400 K/uL   nRBC 0.0 0.0 - 0.2 %   Neutrophils Relative % 71 %   Neutro Abs 10.2 (H) 1.7 - 7.7 K/uL   Lymphocytes Relative 15 %   Lymphs Abs 2.1 0.7 - 4.0 K/uL   Monocytes Relative 10 %   Monocytes Absolute 1.4 (H) 0.1 - 1.0 K/uL   Eosinophils Relative 2 %   Eosinophils Absolute 0.2 0.0 - 0.5 K/uL   Basophils Relative 1 %   Basophils Absolute 0.1 0.0 - 0.1 K/uL   Immature Granulocytes 1 %   Abs Immature Granulocytes 0.11 (H) 0.00 - 0.07 K/uL  Culture, blood (routine x 2)     Status: None (Preliminary result)   Collection Time: 10/04/20  7:09 PM   Specimen: BLOOD  Result Value Ref Range   Specimen Description      BLOOD LEFT ANTECUBITAL Performed at [facility], [address] [place], [place] [address]    Special Requests      BOTTLES DRAWN AEROBIC AND ANAEROBIC Blood Culture adequate volume Performed at [facility]  [address] [place], [place] [address]    Culture      NO GROWTH < 12 HOURS Performed at [facility] [address], [place], [place] [address]    Report Status PENDING   Resp Panel by RT-PCR (Flu A&B, Covid) Nasopharyngeal Swab     Status: None   Collection Time: 10/04/20  7:09 PM  Specimen: Nasopharyngeal Swab; Nasopharyngeal(NP) swabs in vial transport medium  Result Value Ref Range   SARS Coronavirus 2 by RT PCR NEGATIVE NEGATIVE   Influenza A by PCR NEGATIVE NEGATIVE   Influenza B by PCR NEGATIVE NEGATIVE  Lactic acid, plasma     Status: None   Collection Time: 10/04/20  9:14 PM  Result Value Ref Range   Lactic Acid, Venous 1.1 0.5 - 1.9 mmol/L  CBG monitoring, ED     Status: None   Collection Time: 10/04/20 11:26 PM  Result Value Ref Range   Glucose-Capillary 78 70 - 99 mg/dL  Hemoglobin A1c     Status: Abnormal   Collection Time: 10/05/20  2:59 AM  Result Value Ref Range   Hgb A1c MFr Bld 7.4 (H) 4.8 - 5.6 %   Mean Plasma Glucose 165.68 mg/dL  TSH     Status: None   Collection Time: 10/05/20  2:59 AM  Result Value Ref Range   TSH 3.274 0.350 - 4.500 uIU/mL  Protime-INR     Status: None   Collection Time: 10/05/20  3:02 AM  Result Value Ref Range   Prothrombin Time 14.1 11.4 - 15.2 seconds   INR 1.1 0.8 - 1.2  APTT     Status: Abnormal   Collection Time: 10/05/20  3:02 AM  Result Value Ref Range   aPTT 44 (H) 24 - 36 seconds  Comprehensive metabolic panel     Status: Abnormal   Collection Time: 10/05/20  3:02 AM  Result Value Ref Range   Sodium 133 (L) 135 - 145 mmol/L   Potassium 2.9 (L) 3.5 - 5.1 mmol/L   Chloride 94 (L) 98 - 111 mmol/L   CO2 26 22 - 32 mmol/L   Glucose, Bld 90 70 - 99 mg/dL   BUN 31 (H) 8 - 23 mg/dL   Creatinine, Ser 1.07 (H) 0.44 - 1.00 mg/dL   Calcium 9.1 8.9 - 10.3 mg/dL   Total Protein 6.9 6.5 - 8.1 g/dL   Albumin 3.2 (L) 3.5 - 5.0 g/dL   AST 17 15 - 41 U/L   ALT 14 0 - 44 U/L   Alkaline Phosphatase 66 38 - 126 U/L   Total  Bilirubin 0.9 0.3 - 1.2 mg/dL   GFR, Estimated 56 (L) >60 mL/min   Anion gap 13 5 - 15  CBC     Status: Abnormal   Collection Time: 10/05/20  3:02 AM  Result Value Ref Range   WBC 11.0 (H) 4.0 - 10.5 K/uL   RBC 4.01 3.87 - 5.11 MIL/uL   Hemoglobin 10.8 (L) 12.0 - 15.0 g/dL   HCT 33.4 (L) 36.0 - 46.0 %   MCV 83.3 80.0 - 100.0 fL   MCH 26.9 26.0 - 34.0 pg   MCHC 32.3 30.0 - 36.0 g/dL   RDW 15.8 (H) 11.5 - 15.5 %   Platelets 315 150 - 400 K/uL   nRBC 0.0 0.0 - 0.2 %  Magnesium     Status: Abnormal   Collection Time: 10/05/20  3:02 AM  Result Value Ref Range   Magnesium 1.3 (L) 1.7 - 2.4 mg/dL  Phosphorus     Status: None   Collection Time: 10/05/20  3:02 AM  Result Value Ref Range   Phosphorus 3.3 2.5 - 4.6 mg/dL  Glucose, capillary     Status: Abnormal   Collection Time: 10/05/20 12:19 PM  Result Value Ref Range   Glucose-Capillary 61 (L) 70 - 99 mg/dL  Recent Results (from the past 240 hour(s))  WOUND CULTURE     Status: Abnormal   Collection Time: 09/30/20  2:27 PM  Result Value Ref Range Status   MICRO NUMBER: [id number]  Final   SPECIMEN QUALITY: Adequate  Final   SOURCE: WOUND (SITE NOT SPECIFIED)  Final   STATUS: FINAL  Final   GRAM STAIN:   Final    Few epithelial cells No white blood cells seen Few Gram positive cocci in pairs   ISOLATE 1: Streptococcus agalactiae (A)  Final    Comment: Heavy growth of Group B Streptococcus isolated Beta-hemolytic streptococci are predictably susceptible to Penicillin and other beta-lactams. Susceptibility testing not routinely performed. Please contact the laboratory within 3 days if susceptibility  testing is desired.   Culture, blood (routine x 2)     Status: None (Preliminary result)   Collection Time: 10/04/20  7:09 PM   Specimen: BLOOD  Result Value Ref Range Status   Specimen Description   Final    BLOOD LEFT ANTECUBITAL Performed at [facility] [address], [place], [place] [address]    Special  Requests   Final    BOTTLES DRAWN AEROBIC AND ANAEROBIC Blood Culture adequate volume Performed at [facility] [address], [place], [place] [address]    Culture   Final    NO GROWTH < 12 HOURS Performed at [facility] [address] [place], [place] [address]    Report Status PENDING  Incomplete  Resp Panel by RT-PCR (Flu A&B, Covid) Nasopharyngeal Swab     Status: None   Collection Time: 10/04/20  7:09 PM   Specimen: Nasopharyngeal Swab; Nasopharyngeal(NP) swabs in vial transport medium  Result Value Ref Range Status   SARS Coronavirus 2 by RT PCR NEGATIVE NEGATIVE Final    Comment: (NOTE) SARS-CoV-2 target nucleic acids are NOT DETECTED.  The SARS-CoV-2 RNA is generally detectable in upper respiratory specimens during the acute phase of infection. The lowest concentration of SARS-CoV-2 viral copies this assay can detect is 138 copies/mL. A negative result does not preclude SARS-Cov-2 infection and should not be used as the sole basis for treatment or other patient management decisions. A negative result may occur with  improper specimen collection/handling, submission of specimen other than nasopharyngeal swab, presence of viral mutation(s) within the areas targeted by this assay, and inadequate number of viral copies(<138 copies/mL). A negative result must be combined with clinical observations, patient history, and epidemiological information. The expected result is Negative.  Fact Sheet for Patients:  EntrepreneurPulse.com.au  Fact Sheet for Healthcare Providers:  IncredibleEmployment.be  This test is no t yet approved or cleared by the [place] FDA and  has been authorized for detection and/or diagnosis of SARS-CoV-2 by FDA under an Emergency Use Authorization (EUA). This EUA will remain  in effect (meaning this test can be used) for the duration of the COVID-19 declaration under Section 564(b)(1)  of the Act, 21 U.S.C.section 360bbb-3(b)(1), unless the authorization is terminated  or revoked sooner.       Influenza A by PCR NEGATIVE NEGATIVE Final   Influenza B by PCR NEGATIVE NEGATIVE Final    Comment: (NOTE) The Xpert Xpress SARS-CoV-2/FLU/RSV plus assay is intended as an aid in the diagnosis of influenza from Nasopharyngeal swab specimens and should not be used as a sole basis for treatment. Nasal washings and aspirates are unacceptable for Xpert Xpress SARS-CoV-2/FLU/RSV testing.  Fact Sheet for Patients: EntrepreneurPulse.com.au  Fact Sheet for  Healthcare Providers: IncredibleEmployment.be  This test is not yet approved or cleared by the [place] and has been authorized for detection and/or diagnosis of SARS-CoV-2 by FDA under an Emergency Use Authorization (EUA). This EUA will remain in effect (meaning this test can be used) for the duration of the COVID-19 declaration under Section 564(b)(1) of the Act, 21 U.S.C. section 360bbb-3(b)(1), unless the authorization is terminated or revoked.  Performed at [facility], [place] [address], [place], [place] [address]      Radiology Studies: DG Foot 2 Views Right  Result Date: 10/05/2020 Please see detailed radiograph report in office note.  No orders to display    Scheduled Meds: . atorvastatin  10 mg Oral Daily  . chlorthalidone  25 mg Oral Daily  . heparin  5,000 Units Subcutaneous Q8H  . insulin aspart  0-15 Units Subcutaneous TID WC  . insulin aspart  0-5 Units Subcutaneous QHS  . losartan  25 mg Oral Daily  . montelukast  10 mg Oral QHS  . pantoprazole  40 mg Oral BID   PRN Meds: acetaminophen **OR** acetaminophen, hydrALAZINE, HYDROmorphone (DILAUDID) injection, ondansetron **OR** ondansetron (ZOFRAN) IV, traMADol Continuous Infusions: . sodium chloride 20 mL/hr at 10/05/20 0312  . piperacillin-tazobactam (ZOSYN)  IV Stopped (10/05/20 0708)      LOS: 1 day  Time spent: Greater than 50% of the 35 minute visit was spent in counseling/coordination of care for the patient as laid out in the A&P.   Dwyane Dee, MD [facility] 10/05/2020, 12:30 PM

## 2020-10-05 NOTE — Telephone Encounter (Signed)
Hospital consult has been requested for the following patient due to osteomyelitis of the right foot.   Patients location [facility] Bed #12  Provider requesting consult  Dr. Dwyane Dee [phone]  Regal was added to the consult, provider requesting consult wanted Regal to be aware of the current status of patient, Please Advise

## 2020-10-05 NOTE — ED Notes (Addendum)
Pt's daughter Loma Sousa would like to be contacted once pt gets a bed upstairs  [phone]

## 2020-10-05 NOTE — Consult Note (Signed)
Podiatry Consult Note  To: Dr. Sabino Gasser Reason for consult: Osteomyelitis From: Dr. Cannon Kettle  HPI: Samantha Mcgrath is a 70 y.o. female patient who seen at bedside for evaluation of infection at right 2nd toe.  Patient complains of progressive pain especially over the last several days reports that she was last seen in our office on Thursday and had her toenail removed and the area cultured and started on antibiotics but the infection worsen states that by Sunday she did not even recall what day it was because she was feeling so bad and states that she called office and was seen again on yesterday and was sent from the office to the emergency room for progressive infection of the right second toe that was extending up the leg.  Patient reports that since being admitted her memory has gotten a little better and that symptoms of fevers have gotten better as well.  Patient also reports that the redness of the right lower extremity seems to be doing a little bit better since admission.  Admits some pain plantar forefoot on the right.  Denies any other pedal complaints at this time.  Patient Active Problem List   Diagnosis Date Noted  . Osteomyelitis due to type 2 diabetes mellitus ([place]) 10/04/2020  . Osteomyelitis of ankle or foot, acute, right ([place]) 10/04/2020  . Chronic renal failure, stage 3a ([place]) 10/08/2019  . Anxiety 07/08/2019  . Combined forms of age-related cataract of right eye 08/15/2018  . Gastroesophageal reflux disease without esophagitis 01/29/2018  . Asthma 01/28/2018  . Hyperlipidemia 01/28/2018  . Type 2 diabetes mellitus without complication, with long-term current use of insulin ([place]) 01/28/2018  . Retinal detachment of left eye with multiple breaks 11/14/2016  . Morbid obesity ([place]) 08/17/2015  . Essential hypertension 06/01/2014    No current facility-administered medications on file prior to encounter.   Current Outpatient Medications on File Prior to Encounter  Medication  Sig Dispense Refill  . atorvastatin (LIPITOR) 10 MG tablet Take 10 mg by mouth daily.    . chlorthalidone (HYGROTON) 50 MG tablet Take 50 mg by mouth daily.    . Cholecalciferol (VITAMIN D3) 250 MCG (10000 UT) capsule Take 10,000 Units by mouth daily.    Marland Kitchen doxycycline (VIBRA-TABS) 100 MG tablet Take 1 tablet (100 mg total) by mouth 2 (two) times daily. (Patient taking differently: Take 100 mg by mouth 2 (two) times daily. Start date : 09/30/20) 30 tablet 1  . glipiZIDE (GLUCOTROL XL) 5 MG 24 hr tablet Take 5 mg by mouth at bedtime.    . Lactobacillus (PROBIOTIC ACIDOPHILUS PO) Take 1 tablet by mouth daily.    Marland Kitchen losartan (COZAAR) 25 MG tablet Take 1 tablet (25 mg total) by mouth daily. 30 tablet 0  . metFORMIN (GLUCOPHAGE-XR) 500 MG 24 hr tablet Take 1,000 mg by mouth 2 (two) times daily.    . montelukast (SINGULAIR) 10 MG tablet Take 10 mg by mouth at bedtime.    . pantoprazole (PROTONIX) 40 MG tablet Take 40 mg by mouth 2 (two) times daily.    Marland Kitchen tiZANidine (ZANAFLEX) 4 MG tablet Take 1 tablet (4 mg total) by mouth daily as needed. (Patient taking differently: Take 4 mg by mouth daily as needed for muscle spasms.) 30 tablet 1  . traMADol (ULTRAM) 50 MG tablet Take 1 tablet (50 mg total) by mouth every 6 (six) hours as needed. 20 tablet 0  . gabapentin (NEURONTIN) 100 MG capsule Take 1 capsule (100 mg total) by mouth  2 (two) times daily. 60 capsule 1    Allergies  Allergen Reactions  . Procaine Other (See Comments) and Palpitations    ALSO BLISTERS WITH TOPICAL Novacaine   Novocain. ALSO BLISTERS WITH TOPICAL    Past Surgical History:  Procedure Laterality Date  . ANKLE SURGERY    . BLADDER SURGERY    . EYE SURGERY    . MELANOMA EXCISION     Of Right Leg  . TONSILLECTOMY    . TUBAL LIGATION      History reviewed. No pertinent family history.  Social History   Socioeconomic History  . Marital status: Divorced    Spouse name: Not on file  . Number of children: Not on file  .  Years of education: Not on file  . Highest education level: Not on file  Occupational History  . Not on file  Tobacco Use  . Smoking status: Never Smoker  . Smokeless tobacco: Never Used  Vaping Use  . Vaping Use: Never used  Substance and Sexual Activity  . Alcohol use: Yes    Comment: Social Drinker  . Drug use: Never  . Sexual activity: Not Currently  Other Topics Concern  . Not on file  Social History Narrative   Diet: Blank      Do you drink/ eat things with caffeine? Yes      Marital status:  Divorced                             What year were you married ? 1969      Do you live in a house, apartment,assistred living, condo, trailer, etc.)? House      Is it one or more stories? One      How many persons live in your home ? 1      Do you have any pets in your home ?(please list) No      Highest Level of education completed: PHD      Current or past profession: Clergy      Do you exercise?   No                           Type & how often       ADVANCED DIRECTIVES (Please bring copies)      Do you have a living will? Yes      Do you have a DNR form?   Yes                   If not, do you want to discuss one?       Do you have signed POA?HPOA forms?  Yes               If so, please bring to your appointment      FUNCTIONAL STATUS- To be completed by Spouse / child / Staff       Do you have difficulty bathing or dressing yourself ? No      Do you have difficulty preparing food or eating ? No      Do you have difficulty managing your mediation ? No      Do you have difficulty managing your finances ? No      Do you have difficulty affording your medication ? No      Social Determinants of Health   Financial Resource Strain: Not on file  Food Insecurity: Not on file  Transportation Needs: Not on file  Physical Activity: Not on file  Stress: Not on file  Social Connections: Not on file  Intimate Partner Violence: Not on file     Objective:  Today's  Vitals   10/05/20 0951 10/05/20 1100 10/05/20 1542 10/05/20 1819  BP: (!) 125/55   (!) 139/50  Pulse: 65   67  Resp: 18   16  Temp: 98.2 F (36.8 C)   98.3 F (36.8 C)  TempSrc: Oral   Oral  SpO2: 97%   93%  Weight: 105.5 kg     Height:      PainSc:  6  Asleep    Body mass index is 37.54 kg/m.   General: Alert and oriented x3 in no acute distress  Dermatology: Dried blood at the nailbeds at previous avulsion site with no obvious bone exposure to the nailbed of the right second toe however there is significant swelling redness warmth to the second toe where the toe is 2 times the size of normal, there is very minimal clear to yellow active drainage, there is no malodor, there is splotches of cellulitis noted on the right lower leg as well as cellulitis to the level of the metatarsophalangeal joint of the right foot.  There is gouty tophi present at the left second distal interphalangeal joint dorsally with surrounding blanchable erythema and tophus pressing up against the skin consistent with clinical gout.  Vascular: Dorsalis Pedis pedal pulse palpable however difficult to palpate the posterior tibial pulse on the right.   Capillary Fill Time 3 seconds except at the involved digit at the right second toe, temperature gradient increased on the right foot secondary to infection.  Neurology: Johney Maine sensation intact via light touch bilateral.  Musculoskeletal: Mild tenderness with palpation at right second toe to the level of the metatarsophalangeal joint.   Results Xrays  Right foot   Impression: Significant fragmentation and osteolysis noted of the distal and middle phalanx of the right second toe with significant soft tissue swelling consistent with changes supportive of osteomyelitis.    Results for orders placed or performed during the hospital encounter of 10/04/20  Culture, blood (routine x 2)     Status: None (Preliminary result)   Collection Time: 10/04/20  7:09 PM    Specimen: BLOOD  Result Value Ref Range Status   Specimen Description   Final    BLOOD LEFT ANTECUBITAL Performed at [facility] [address], [place], [place] [address]    Special Requests   Final    BOTTLES DRAWN AEROBIC AND ANAEROBIC Blood Culture adequate volume Performed at [facility] [address], [place], [place] [address]    Culture   Final    NO GROWTH < 12 HOURS Performed at [facility] [address] [place], [place] [address]    Report Status PENDING  Incomplete  Resp Panel by RT-PCR (Flu A&B, Covid) Nasopharyngeal Swab     Status: None   Collection Time: 10/04/20  7:09 PM   Specimen: Nasopharyngeal Swab; Nasopharyngeal(NP) swabs in vial transport medium  Result Value Ref Range Status   SARS Coronavirus 2 by RT PCR NEGATIVE NEGATIVE Final    Comment: (NOTE) SARS-CoV-2 target nucleic acids are NOT DETECTED.  The SARS-CoV-2 RNA is generally detectable in upper respiratory specimens during the acute phase of infection. The lowest concentration of SARS-CoV-2 viral copies this assay can detect is 138 copies/mL. A negative result does  not preclude SARS-Cov-2 infection and should not be used as the sole basis for treatment or other patient management decisions. A negative result may occur with  improper specimen collection/handling, submission of specimen other than nasopharyngeal swab, presence of viral mutation(s) within the areas targeted by this assay, and inadequate number of viral copies(<138 copies/mL). A negative result must be combined with clinical observations, patient history, and epidemiological information. The expected result is Negative.  Fact Sheet for Patients:  EntrepreneurPulse.com.au  Fact Sheet for Healthcare Providers:  IncredibleEmployment.be  This test is no t yet approved or cleared by the [place] FDA and  has been authorized for detection and/or  diagnosis of SARS-CoV-2 by FDA under an Emergency Use Authorization (EUA). This EUA will remain  in effect (meaning this test can be used) for the duration of the COVID-19 declaration under Section 564(b)(1) of the Act, 21 U.S.C.section 360bbb-3(b)(1), unless the authorization is terminated  or revoked sooner.       Influenza A by PCR NEGATIVE NEGATIVE Final   Influenza B by PCR NEGATIVE NEGATIVE Final    Comment: (NOTE) The Xpert Xpress SARS-CoV-2/FLU/RSV plus assay is intended as an aid in the diagnosis of influenza from Nasopharyngeal swab specimens and should not be used as a sole basis for treatment. Nasal washings and aspirates are unacceptable for Xpert Xpress SARS-CoV-2/FLU/RSV testing.  Fact Sheet for Patients: EntrepreneurPulse.com.au  Fact Sheet for Healthcare Providers: IncredibleEmployment.be  This test is not yet approved or cleared by the [place] FDA and has been authorized for detection and/or diagnosis of SARS-CoV-2 by FDA under an Emergency Use Authorization (EUA). This EUA will remain in effect (meaning this test can be used) for the duration of the COVID-19 declaration under Section 564(b)(1) of the Act, 21 U.S.C. section 360bbb-3(b)(1), unless the authorization is terminated or revoked.  Performed at [facility], [place] [address] [place],  [address]      Assessment and Plan: Problem List Items Addressed This Visit   None   Visit Diagnoses    Osteomyelitis of right foot, unspecified type ([place])    -  Primary       -Complete examination performed -Xrays reviewed -Discussed treatment options for osteomyelitis right second toe -Patient is agreeable for surgical intervention.  Consent to be obtained for right second toe amputation. Pre and Post op course explained. Risks, benefits, alternatives explained. No guarantees given or implied.  Case request placed for OR tomorrow 10/06/2020 in  the evening.  Patient to be n.p.o. after breakfast -Recommend rest and elevation for pain and edema control -Recommend to continue with medical management and IV antibiotics -Patient to be weightbearing to heel only for bedside and transfers only -Ordered ABIs for surveillance for possible PAD however at this time due to the acute infection we will plan to proceed anyway with surgery and we can follow-up these results after surgery -Discussed with patient concern for clinical gout noted at the left second toe -Recommend hospitalist at this time to check patient for gout by doing a uric acid and to treat accordingly -Consult appreciated -Podiatry to follow   Dr. Landis Martins, [place] [facility] [place] [phone] office [phone] cell Available via secure chat  Time spent with patient for exam and coordination of care:   35 mins

## 2020-10-05 NOTE — ED Notes (Signed)
Secure message sent to [name] for admission

## 2020-10-05 NOTE — Plan of Care (Signed)

## 2020-10-05 NOTE — Hospital Course (Signed)
Samantha Mcgrath is a 70 yo female with PMH DMII, HTN, HLD, depression, asthma who presented to the hospital with worsening pain and swelling in her right foot second toe.  She is followed outpatient by podiatry.  She has been also treated with a course of doxycycline outpatient with no improvement. Due to the nonimprovement and development of fevers at home prior to admission, she presented for further evaluation. She was started on Zosyn on admission and podiatry was consulted for further inpatient evaluation in case of need for toe amputation.

## 2020-10-05 NOTE — Progress Notes (Signed)
Subjective:   Patient ID: Samantha Mcgrath, female   DOB: 70 y.o.   MRN: [id number]   HPI Patient presents stating that she ran a fever all weekend and knew she was supposed to the emergency room but is scared of emergency rooms and states that she is just not feeling good.  Patient states her second toe right has been swollen and it swelling into her foot and lower leg   ROS      Objective:  Physical Exam  Neurovascular status unchanged with patient not having a temperature at this particular incident but does have a lot of discoloration and swelling of the right second digit with significant osteolysis and has swelling to the MPJ and slightly to the midfoot right with no active drainage noted in this area.       Assessment:  Cellulitis of the right foot secondary to probably osteomyelitis second digit right foot and significant infection of the digit     Plan:  H&P educated her on the seriousness of this condition and I do think that she needs to go straight to the emergency room and she is going to do this to [facility].  Our on-call doctor will evaluate and she understands that amputation is a very high possibility but she needs IV antibiotics to start and she is encouraged to call questions concerns.  Patient does have a staph strep infection which I made her aware of today and all that is available on her chart and will be reviewed by the hospital  X-ray indicates significant osteolysis of the distal phalanx digit to right

## 2020-10-06 ENCOUNTER — Inpatient Hospital Stay (HOSPITAL_COMMUNITY): Payer: Medicare HMO | Admitting: Certified Registered Nurse Anesthetist

## 2020-10-06 ENCOUNTER — Inpatient Hospital Stay (HOSPITAL_COMMUNITY): Payer: Medicare HMO

## 2020-10-06 ENCOUNTER — Encounter (HOSPITAL_COMMUNITY)
Admission: EM | Disposition: A | Discharge status: Home Health | Payer: Self-pay | Source: Home / Self Care | Attending: Internal Medicine

## 2020-10-06 ENCOUNTER — Ambulatory Visit: Payer: Medicare HMO | Admitting: Family

## 2020-10-06 ENCOUNTER — Encounter (HOSPITAL_COMMUNITY): Payer: Self-pay | Admitting: Internal Medicine

## 2020-10-06 DIAGNOSIS — K219 Gastro-esophageal reflux disease without esophagitis: Secondary | ICD-10-CM

## 2020-10-06 DIAGNOSIS — L0390 Cellulitis, unspecified: Secondary | ICD-10-CM

## 2020-10-06 DIAGNOSIS — M86171 Other acute osteomyelitis, right ankle and foot: Secondary | ICD-10-CM

## 2020-10-06 DIAGNOSIS — J45909 Unspecified asthma, uncomplicated: Secondary | ICD-10-CM

## 2020-10-06 DIAGNOSIS — M86671 Other chronic osteomyelitis, right ankle and foot: Secondary | ICD-10-CM

## 2020-10-06 HISTORY — PX: AMPUTATION TOE: SHX6595

## 2020-10-06 LAB — MAGNESIUM: Magnesium: 1.6 mg/dL — ABNORMAL LOW (ref 1.7–2.4)

## 2020-10-06 LAB — CBC WITH DIFFERENTIAL/PLATELET
Abs Immature Granulocytes: 0.06 10*3/uL (ref 0.00–0.07)
Basophils Absolute: 0.1 10*3/uL (ref 0.0–0.1)
Basophils Relative: 1 %
Eosinophils Absolute: 0.5 10*3/uL (ref 0.0–0.5)
Eosinophils Relative: 5 %
HCT: 32 % — ABNORMAL LOW (ref 36.0–46.0)
Hemoglobin: 10.3 g/dL — ABNORMAL LOW (ref 12.0–15.0)
Immature Granulocytes: 1 %
Lymphocytes Relative: 24 %
Lymphs Abs: 2.4 10*3/uL (ref 0.7–4.0)
MCH: 26.9 pg (ref 26.0–34.0)
MCHC: 32.2 g/dL (ref 30.0–36.0)
MCV: 83.6 fL (ref 80.0–100.0)
Monocytes Absolute: 1.2 10*3/uL — ABNORMAL HIGH (ref 0.1–1.0)
Monocytes Relative: 13 %
Neutro Abs: 5.5 10*3/uL (ref 1.7–7.7)
Neutrophils Relative %: 56 %
Platelets: 323 10*3/uL (ref 150–400)
RBC: 3.83 MIL/uL — ABNORMAL LOW (ref 3.87–5.11)
RDW: 15.7 % — ABNORMAL HIGH (ref 11.5–15.5)
WBC: 9.7 10*3/uL (ref 4.0–10.5)
nRBC: 0 % (ref 0.0–0.2)

## 2020-10-06 LAB — BASIC METABOLIC PANEL
Anion gap: 9 (ref 5–15)
BUN: 33 mg/dL — ABNORMAL HIGH (ref 8–23)
CO2: 29 mmol/L (ref 22–32)
Calcium: 8.9 mg/dL (ref 8.9–10.3)
Chloride: 97 mmol/L — ABNORMAL LOW (ref 98–111)
Creatinine, Ser: 1.21 mg/dL — ABNORMAL HIGH (ref 0.44–1.00)
GFR, Estimated: 48 mL/min — ABNORMAL LOW (ref >60)
Glucose, Bld: 122 mg/dL — ABNORMAL HIGH (ref 70–99)
Potassium: 3.2 mmol/L — ABNORMAL LOW (ref 3.5–5.1)
Sodium: 135 mmol/L (ref 135–145)

## 2020-10-06 LAB — GLUCOSE, CAPILLARY
Glucose-Capillary: 94 mg/dL (ref 70–99)
Glucose-Capillary: 145 mg/dL — ABNORMAL HIGH (ref 70–99)
Glucose-Capillary: 97 mg/dL (ref 70–99)
Glucose-Capillary: 128 mg/dL — ABNORMAL HIGH (ref 70–99)

## 2020-10-06 SURGERY — AMPUTATION, TOE
Anesthesia: Monitor Anesthesia Care | Site: Toe | Laterality: Right

## 2020-10-06 MED ORDER — LACTATED RINGERS IV SOLN: INTRAVENOUS | Status: DC

## 2020-10-06 MED ORDER — 0.9 % SODIUM CHLORIDE (POUR BTL) OPTIME
TOPICAL | Status: DC | PRN
  Administered 2020-10-06: 1000 mL

## 2020-10-06 MED ORDER — MIDAZOLAM HCL 2 MG/2ML IJ SOLN
INTRAMUSCULAR | Status: AC
  Filled 2020-10-06: qty 2

## 2020-10-06 MED ORDER — DULOXETINE HCL 20 MG PO CPEP
20.0000 mg | ORAL_CAPSULE | Freq: Every day | ORAL | Status: DC
  Administered 2020-10-06 – 2020-10-08 (×3): 20 mg via ORAL
  Filled 2020-10-06 (×3): qty 1

## 2020-10-06 MED ORDER — FENTANYL CITRATE (PF) 100 MCG/2ML IJ SOLN
INTRAMUSCULAR | Status: DC | PRN
  Administered 2020-10-06 (×2): 25 ug via INTRAVENOUS
  Administered 2020-10-06: 50 ug via INTRAVENOUS

## 2020-10-06 MED ORDER — CHLORHEXIDINE GLUCONATE 0.12 % MT SOLN
15.0000 mL | OROMUCOSAL | Status: AC
  Administered 2020-10-06: 15 mL via OROMUCOSAL

## 2020-10-06 MED ORDER — BUPIVACAINE HCL (PF) 0.5 % IJ SOLN
INTRAMUSCULAR | Status: DC | PRN
Start: 2020-10-06 — End: 2020-10-06
  Administered 2020-10-06: 10 mL

## 2020-10-06 MED ORDER — LIDOCAINE HCL 1 % IJ SOLN
INTRAMUSCULAR | Status: DC | PRN
  Administered 2020-10-06: 10 mL

## 2020-10-06 MED ORDER — ONDANSETRON HCL 4 MG/2ML IJ SOLN
INTRAMUSCULAR | Status: DC | PRN
  Administered 2020-10-06: 4 mg via INTRAVENOUS

## 2020-10-06 MED ORDER — FENTANYL CITRATE (PF) 100 MCG/2ML IJ SOLN: 25.0000 ug | INTRAMUSCULAR | Status: DC | PRN

## 2020-10-06 MED ORDER — LIDOCAINE HCL (PF) 1 % IJ SOLN
INTRAMUSCULAR | Status: AC
  Filled 2020-10-06: qty 30

## 2020-10-06 MED ORDER — BUPIVACAINE HCL (PF) 0.5 % IJ SOLN
INTRAMUSCULAR | Status: AC
  Filled 2020-10-06: qty 30

## 2020-10-06 MED ORDER — FENTANYL CITRATE (PF) 100 MCG/2ML IJ SOLN
INTRAMUSCULAR | Status: AC
  Filled 2020-10-06: qty 2

## 2020-10-06 MED ORDER — EPHEDRINE SULFATE-NACL 50-0.9 MG/10ML-% IV SOSY
PREFILLED_SYRINGE | INTRAVENOUS | Status: DC | PRN
  Administered 2020-10-06 (×2): 10 mg via INTRAVENOUS

## 2020-10-06 MED ORDER — POTASSIUM CHLORIDE CRYS ER 20 MEQ PO TBCR
40.0000 meq | EXTENDED_RELEASE_TABLET | ORAL | Status: DC
  Administered 2020-10-06: 40 meq via ORAL
  Filled 2020-10-06: qty 2

## 2020-10-06 MED ORDER — PROPOFOL 500 MG/50ML IV EMUL
INTRAVENOUS | Status: DC | PRN
  Administered 2020-10-06: 40 mg via INTRAVENOUS
  Administered 2020-10-06: 75 ug/kg/min via INTRAVENOUS
  Administered 2020-10-06 (×2): 40 mg via INTRAVENOUS

## 2020-10-06 MED ORDER — LIDOCAINE-EPINEPHRINE (PF) 1 %-1:200000 IJ SOLN
INTRAMUSCULAR | Status: AC
  Filled 2020-10-06: qty 30

## 2020-10-06 SURGICAL SUPPLY — 24 items
APL PRP STRL LF DISP 70% ISPRP (MISCELLANEOUS) ×1
BNDG COHESIVE 1X5 TAN STRL LF (GAUZE/BANDAGES/DRESSINGS) ×1 IMPLANT
BNDG CONFORM 3 STRL LF (GAUZE/BANDAGES/DRESSINGS) ×1 IMPLANT
BNDG ELASTIC 3X5.8 VLCR STR LF (GAUZE/BANDAGES/DRESSINGS) ×1 IMPLANT
CHLORAPREP W/TINT 26 (MISCELLANEOUS) ×2 IMPLANT
COVER SURGICAL LIGHT HANDLE (MISCELLANEOUS) ×2 IMPLANT
ELECT REM PT RETURN 15FT ADLT (MISCELLANEOUS) ×2 IMPLANT
GAUZE SPONGE 4X4 12PLY STRL (GAUZE/BANDAGES/DRESSINGS) ×1 IMPLANT
GLOVE SURG ENC MOIS LTX SZ6.5 (GLOVE) ×2 IMPLANT
GLOVE SURG UNDER POLY LF SZ6.5 (GLOVE) ×2 IMPLANT
GOWN STRL REUS W/ TWL LRG LVL3 (GOWN DISPOSABLE) ×1 IMPLANT
GOWN STRL REUS W/TWL LRG LVL3 (GOWN DISPOSABLE) ×2
KIT BASIN OR (CUSTOM PROCEDURE TRAY) ×2 IMPLANT
KIT TURNOVER KIT A (KITS) ×2 IMPLANT
MANIFOLD NEPTUNE II (INSTRUMENTS) ×2 IMPLANT
NS IRRIG 1000ML POUR BTL (IV SOLUTION) ×2 IMPLANT
PACK ORTHO EXTREMITY (CUSTOM PROCEDURE TRAY) ×2 IMPLANT
SUT PROLENE 3 0 PS 1 (SUTURE) ×1 IMPLANT
SUT VIC AB 2-0 CT1 27 (SUTURE) ×2
SUT VIC AB 2-0 CT1 TAPERPNT 27 (SUTURE) IMPLANT
SWAB COLLECTION DEVICE MRSA (MISCELLANEOUS) ×2 IMPLANT
SWAB CULTURE ESWAB REG 1ML (MISCELLANEOUS) ×2 IMPLANT
SYR CONTROL 10ML LL (SYRINGE) ×1 IMPLANT
TOWEL OR 17X26 10 PK STRL BLUE (TOWEL DISPOSABLE) ×2 IMPLANT

## 2020-10-06 NOTE — Progress Notes (Signed)
ABI's have been completed. Preliminary results can be found in CV Proc through chart review.   10/06/20 12:14 PM Samantha Mcgrath RVT

## 2020-10-06 NOTE — Progress Notes (Signed)
Orthopedic Tech Progress Note Patient Details:  Samantha Mcgrath 10-11-1950 [id number]  Ortho Devices Type of Ortho Device: Postop shoe/boot Ortho Device/Splint Interventions: Ordered   Post Interventions Instructions Provided: Adjustment of device   [name] [name] 10/06/2020, 7:14 PM

## 2020-10-06 NOTE — H&P (Signed)
Anesthesia H&P Update: History and Physical Exam reviewed; patient is OK for planned anesthetic and procedure. ? ?

## 2020-10-06 NOTE — Op Note (Signed)
DATE OF SURGERY:10/06/20  PREOPERATIVE DIAGNOSIS: Osteomyelitis right second toe  POST OP DIAGNOSIS: Osteomyelitis right second toe with significant gouty tophi this is a case of infected gout  PROCEDURE PERFORMED: Right second toe amputation  ANESTHESIA: Monitored anesthesia care with local Pre-, post, and Intra-Op injection of 10 cc of one-to-one mixture 1% lidocaine plain and 0.5% Marcaine plain  INDICATIONS FOR PROCEDURE:  This 70 y.o. diabetic female patient seen in office on a few occassions for worsening infection of right second toe with Dr. Paulla Dolly.  The patient has a  history of diabetes uncontrolled and chronic toe problem for the last 5 to 6 months that worsened quickly over the last 2 days requiring hospital admission.  The patient was given the option of amputation as a treatment for the cellulitis and osteomyelitis with underlying concern for infected gout.The patient desires to surgery. The risks versus benefits of the procedure were discussed with the patient in detail by Dr. Cannon Kettle. The consent is available on the chart for review.  PROCEDURE IN DETAIL: After patient was taken to the operating room and placed on the operating table in the supine position, a safety strap was placed across the patient's waist.  Adequate IV sedation was administered by the Department of Anesthesia and a total of 10 cc of 1:1 mixture 1% lidocaine and 0.25% Marcaine plain were injected as a digital block to right 2nd toe.  A tourniquet was placed but not yet inflated. The foot was prepped and draped in the usual aseptic fashion lowering the operative Field.  Attention was directed to the right 2nd toe where there was dry hemorrhagic wound at the distal tip of the toe with significant swelling of the entire digit appearing like a sausage digit.  The tourniquet was inflated then a #15 blade was used to make an incision down the bone in a elliptical fashion encompassing the toe.  The incision was carried  mediolaterally and plantarly encompassing the toe leaving a large amount of plantar skin intact.  Next, the toe was disarticulated at the metatarsophalangeal joint and removed. The toe was sent for bone culture and sensitivity to pathology.  There was significant gouty tophi throughout the entire toe and at the base of the second toe joint. After removal of all nonviable soft tissue and bone  there was no malodor.  Anaerobic and aerobic cultures were taken and passed this as a specimen to microbiology.  Next, copious amounts of saline were instilled into the wound using a pulse lavage. A #2-0 Vicryl was used to reapproximate the deep subcutaneous layer to release skin tension.  The flap was folded dorsally and  skin re-approximated using 3-0 Prolene in a simple suture technique.  Iris scissors were used to modify and remodel the skin flap as needed.  An excellent cosmetic result was achieved.  Tourniquet was deflated. The patient tolerated the above anesthesia and surgery without apparent complications.  A standard postoperative dressing was applied consisting of Betadine 4x4s, Kling, Coban and ACE wrap.  The patient was transported via cart to [facility] with vital signs able and vascular status intact to remaining digits of the right foot.   Patient to return to medical floor for continued medical management.   Podiatry to follow while inpatient.  Recommend patient to work with physical therapy for weightbearing to heel prior to discharge likely patient can go home on Friday with oral antibiotics and with medication for gout.  Patient will follow-up in office after discharge within 1  week for dressing change.  Patient to keep dressing clean dry and intact until follow-up in office.  Landis Martins, DPM

## 2020-10-06 NOTE — Plan of Care (Signed)

## 2020-10-06 NOTE — H&P (Signed)
Hospitalist H&P reviewed. Patient to undergo Right 2nd toe amputation for osteomyelitis. Risk and alternative discussed. No gaurantees given or implied. All patient's questions answered to satisfaction -Dr. Cannon Kettle

## 2020-10-06 NOTE — Transfer of Care (Signed)
Immediate Anesthesia Transfer of Care Note  Patient: Samantha Mcgrath  Procedure(s) Performed: Procedure(s): AMPUTATION 2ND TOE (Right)  Patient Location: PACU   Anesthesia Type:MAC  Level of Consciousness: awake, alert  and oriented  Airway & Oxygen Therapy: Patient Spontanous Breathing and Patient connected to nasal cannula oxygen  Post-op Assessment: Report given to RN and Post -op Vital signs reviewed and stable  Post vital signs: Reviewed and stable  Last Vitals:  Vitals:   10/06/20 1647 10/06/20 1655  BP: (!) 159/71   Pulse:  69  Resp: 14   Temp:    SpO2:  34%    Complications: No apparent anesthesia complications

## 2020-10-06 NOTE — Anesthesia Preprocedure Evaluation (Addendum)
Anesthesia Evaluation  Patient identified by MRN, date of birth, ID band Patient awake    Reviewed: Allergy & Precautions, NPO status , Patient's Chart, lab work & pertinent test results  Airway Mallampati: I  TM Distance: >3 FB Neck ROM: Full    Dental  (+) Teeth Intact, Dental Advisory Given   Pulmonary asthma ,    Pulmonary exam normal breath sounds clear to auscultation       Cardiovascular hypertension, Pt. on medications Normal cardiovascular exam Rhythm:Regular Rate:Normal  TTE 2022 1. Left ventricular ejection fraction, by estimation, is 60 to 65%. The  left ventricle has normal function. The left ventricle has no regional  wall motion abnormalities. There is mild concentric left ventricular  hypertrophy. Left ventricular diastolic  parameters are consistent with Grade I diastolic dysfunction (impaired  relaxation). Elevated left ventricular end-diastolic pressure.  2. Right ventricular systolic function is normal. The right ventricular  size is normal.  3. The mitral valve is degenerative. No evidence of mitral valve  regurgitation. No evidence of mitral stenosis. Severe mitral annular  calcification.  4. The aortic valve is normal in structure. Aortic valve regurgitation is  not visualized. No aortic stenosis is present.  5. The inferior vena cava is normal in size with greater than 50%  respiratory variability, suggesting right atrial pressure of 3 mmHg.    Neuro/Psych negative neurological ROS  negative psych ROS   GI/Hepatic Neg liver ROS, GERD  Controlled and Medicated,  Endo/Other  negative endocrine ROSdiabetes, Type 2, Oral Hypoglycemic AgentsObese BMI 37  Renal/GU Renal InsufficiencyRenal disease (Cr 1.21, K 3.2)  negative genitourinary   Musculoskeletal negative musculoskeletal ROS (+)   Abdominal   Peds  Hematology negative hematology ROS (+)   Anesthesia Other Findings    Reproductive/Obstetrics                            Anesthesia Physical Anesthesia Plan  ASA: III  Anesthesia Plan: MAC   Post-op Pain Management:    Induction: Intravenous  PONV Risk Score and Plan: Propofol infusion, Treatment may vary due to age or medical condition and Ondansetron  Airway Management Planned: Natural Airway  Additional Equipment:   Intra-op Plan:   Post-operative Plan:   Informed Consent: I have reviewed the patients History and Physical, chart, labs and discussed the procedure including the risks, benefits and alternatives for the proposed anesthesia with the patient or authorized representative who has indicated his/her understanding and acceptance.     Dental advisory given  Plan Discussed with: CRNA  Anesthesia Plan Comments:         Anesthesia Quick Evaluation

## 2020-10-06 NOTE — Progress Notes (Addendum)
PROGRESS NOTE    Samantha Mcgrath  QJJ:[id number] DOB: [birth year] DOA: 10/04/2020 PCP: Sandrea Hughs, NP    Brief Narrative:  Mrs. Samantha Mcgrath was admitted to the hospital with the working diagnosis of right foot second toe osteomyelitis.   70 year old female with a past medical history of type II diabetes mellitus, hypertension, dyslipidemia and obesity class II, who presents with worsening edema and erythema at the her right foot second toe.  As an outpatient she failed antibiotic therapy with doxycycline.  She had 3 days of fevers and worsening back pain.  On her initial physical examination blood pressure 165/75, heart rate 78, respiratory rate 20, oxygen saturation 94%, lungs are clear to auscultation bilaterally, heart S1-S2, present, rhythmic, soft abdomen, right foot second toe with edema, erythema, discoloration and purulence.  Sodium 134, potassium 3.0, chloride 93, bicarb 27, glucose 94, BUN 34, creatinine 1.20, white count 14.1, hemoglobin 12.6, hematocrit 40.1, platelets 374. SARS COVID-19 negative.  Patient was placed on broad-spectrum IV antibiotic therapy, podiatry was consulted.  Plan for right foot second toe amputation today.   Assessment & Plan:   Principal Problem:   Osteomyelitis due to type 2 diabetes mellitus (HCC) Active Problems:   Asthma   Essential hypertension   Gastroesophageal reflux disease without esophagitis   Chronic renal failure, stage 3a (HCC)   Type 2 diabetes mellitus without complication, with long-term current use of insulin (HCC)   Osteomyelitis of ankle or foot, acute, right ([place])   1. Right foot 2nd toe osteomyelitis. Failed outpatient antibiotic therapy. Patient continue to have right foot pain.  No nausea or vomiting, she has been afebrile. Wbc is 9,7 and blood cultures continue with no growth. Wound culture positive for streptococcus agalactiae.  Continue with hydromorphone as needed.   Continue with IV  Zosyn for now.  Follow cell  count and cultures.   2. T2DM/ dyslipidemia. Uncontrolled with hyperglycemia, continue insulin sliding scale for glucose cover and monitoring.   Continue with statin therapy.   3. CKD stage 3. hypokalemia. Continue supportive medical therapy, avoid hypotension and nephrotoxic medications.  Renal function stable with serum cr at 1,21.   4. HTN. Continue blood pressure control with chlorthalidone and losartan.    5. GERD. Continue pantoprazole.   6. Acute on chronic back pain. Continue pain control with analgesics (tamadol), will add duloxetine.  Not able to use Non steroidal antiinflammatory agents due to renal failure.  Post surgery will have PT and OT.  Possible disc disease, joint disease.    Patient continue to be at high risk for worsening foot infection.   Status is: Inpatient  Remains inpatient appropriate because:Inpatient level of care appropriate due to severity of illness   Dispo: The patient is from: Home              Anticipated d/c is to: Home              Patient currently is not medically stable to d/c.   Difficult to place patient No   DVT prophylaxis: Enoxaparin   Code Status:   full  Family Communication:  I spoke with patient's daughter and son at the bedside, we talked in detail about patient's condition, plan of care and prognosis and all questions were addressed.    Consultants:   Podiatry    Antimicrobials:   Zosyn      Subjective: Patient with positive right foot pain, no nausea or vomiting, positive back pain, worse with movement.   Objective: Vitals:  10/05/20 1819 10/05/20 2120 10/06/20 0523 10/06/20 1335  BP: (!) 139/50 (!) 143/59 122/62 134/65  Pulse: 67 72 66 66  Resp: 16 18 17 16   Temp: 98.3 F (36.8 C) 98.2 F (36.8 C) 97.8 F (36.6 C) 98.2 F (36.8 C)  TempSrc: Oral Oral Oral Oral  SpO2: 93% 100%  100%  Weight:   103 kg   Height:        Intake/Output Summary (Last 24 hours) at 10/06/2020 1530 Last data filed at  10/06/2020 1200 Gross per 24 hour  Intake 220 ml  Output 400 ml  Net -180 ml   Filed Weights   10/04/20 1749 10/05/20 0951 10/06/20 0523  Weight: 102.1 kg 105.5 kg 103 kg    Examination:   General: Not in pain or dyspnea, deconditioned  Neurology: Awake and alert, non focal  E ENT: mild pallor, no icterus, oral mucosa moist Cardiovascular: No JVD. S1-S2 present, rhythmic, no gallops, rubs, or murmurs. No lower extremity edema. Pulmonary: positive breath sounds bilaterally, adequate air movement, no wheezing, rhonchi or rales. Gastrointestinal. Abdomen soft and non tender Skin. No rashes Musculoskeletal: no joint deformities     Data Reviewed: I have personally reviewed following labs and imaging studies  CBC: Recent Labs  Lab 10/04/20 1909 10/05/20 0302 10/06/20 0528  WBC 14.1* 11.0* 9.7  NEUTROABS 10.2*  --  5.5  HGB 12.6 10.8* 10.3*  HCT 40.1 33.4* 32.0*  MCV 83.7 83.3 83.6  PLT 374 315 160   Basic Metabolic Panel: Recent Labs  Lab 10/04/20 1909 10/05/20 0302 10/06/20 0528  NA 134* 133* 135  K 3.0* 2.9* 3.2*  CL 93* 94* 97*  CO2 27 26 29   GLUCOSE 94 90 122*  BUN 34* 31* 33*  CREATININE 1.20* 1.07* 1.21*  CALCIUM 9.7 9.1 8.9  MG  --  1.3* 1.6*  PHOS  --  3.3  --    GFR: Estimated Creatinine Clearance: 51.7 mL/min (A) (by C-G formula based on SCr of 1.21 mg/dL (H)). Liver Function Tests: Recent Labs  Lab 10/04/20 1909 10/05/20 0302  AST 20 17  ALT 16 14  ALKPHOS 85 66  BILITOT 0.6 0.9  PROT 8.4* 6.9  ALBUMIN 3.9 3.2*   No results for input(s): LIPASE, AMYLASE in the last 168 hours. No results for input(s): AMMONIA in the last 168 hours. Coagulation Profile: Recent Labs  Lab 10/05/20 0302  INR 1.1   Cardiac Enzymes: No results for input(s): CKTOTAL, CKMB, CKMBINDEX, TROPONINI in the last 168 hours. BNP (last 3 results) No results for input(s): PROBNP in the last 8760 hours. HbA1C: Recent Labs    10/05/20 0259  HGBA1C 7.4*    CBG: Recent Labs  Lab 10/05/20 1219 10/05/20 1742 10/05/20 2122 10/06/20 0743 10/06/20 1150  GLUCAP 61* 131* 103* 94 145*   Lipid Profile: No results for input(s): CHOL, HDL, LDLCALC, TRIG, CHOLHDL, LDLDIRECT in the last 72 hours. Thyroid Function Tests: Recent Labs    10/05/20 0259  TSH 3.274   Anemia Panel: No results for input(s): VITAMINB12, FOLATE, FERRITIN, TIBC, IRON, RETICCTPCT in the last 72 hours.    Radiology Studies: I have reviewed all of the imaging during this hospital visit personally     Scheduled Meds: . atorvastatin  10 mg Oral Daily  . chlorthalidone  25 mg Oral Daily  . heparin  5,000 Units Subcutaneous Q8H  . insulin aspart  0-15 Units Subcutaneous TID WC  . insulin aspart  0-5 Units Subcutaneous QHS  . losartan  25 mg Oral Daily  . montelukast  10 mg Oral QHS  . pantoprazole  40 mg Oral BID   Continuous Infusions: . piperacillin-tazobactam (ZOSYN)  IV 3.375 g (10/06/20 1458)     LOS: 2 days        Kerith Sherley Gerome Apley, MD

## 2020-10-06 NOTE — Anesthesia Postprocedure Evaluation (Signed)
Anesthesia Post Note  Patient: Samantha Mcgrath  Procedure(s) Performed: AMPUTATION 2ND TOE (Right Toe)     Patient location during evaluation: PACU Anesthesia Type: MAC Level of consciousness: awake and alert Pain management: pain level controlled Vital Signs Assessment: post-procedure vital signs reviewed and stable Respiratory status: spontaneous breathing, nonlabored ventilation, respiratory function stable and patient connected to nasal cannula oxygen Cardiovascular status: stable and blood pressure returned to baseline Postop Assessment: no apparent nausea or vomiting Anesthetic complications: no   No complications documented.  Last Vitals:  Vitals:   10/06/20 1900 10/06/20 1915  BP: (!) 112/56 120/63  Pulse: 79 82  Resp: 10 10  Temp:    SpO2: 91% 100%    Last Pain:  Vitals:   10/06/20 1856  TempSrc:   PainSc: 0-No pain                 Zaelyn Barbary L Orry Sigl

## 2020-10-07 ENCOUNTER — Inpatient Hospital Stay (HOSPITAL_COMMUNITY): Payer: Medicare HMO

## 2020-10-07 ENCOUNTER — Ambulatory Visit: Payer: Medicare HMO

## 2020-10-07 ENCOUNTER — Encounter (HOSPITAL_COMMUNITY): Payer: Self-pay | Admitting: Sports Medicine

## 2020-10-07 LAB — CBC WITH DIFFERENTIAL/PLATELET
Abs Immature Granulocytes: 0.09 10*3/uL — ABNORMAL HIGH (ref 0.00–0.07)
Absolute Monocytes: 797 cells/uL (ref 200–950)
Basophils Absolute: 0.1 10*3/uL (ref 0.0–0.1)
Basophils Absolute: 149 cells/uL (ref 0–200)
Basophils Relative: 1 %
Basophils Relative: 1.8 %
Eosinophils Absolute: 0.3 10*3/uL (ref 0.0–0.5)
Eosinophils Absolute: 789 cells/uL — ABNORMAL HIGH (ref 15–500)
Eosinophils Relative: 4 %
Eosinophils Relative: 9.5 %
HCT: 33.6 % — ABNORMAL LOW (ref 36.0–46.0)
HCT: 35.6 % (ref 35.0–45.0)
Hemoglobin: 10.5 g/dL — ABNORMAL LOW (ref 12.0–15.0)
Hemoglobin: 11.2 g/dL — ABNORMAL LOW (ref 11.7–15.5)
Immature Granulocytes: 1 %
Lymphocytes Relative: 18 %
Lymphs Abs: 1.7 10*3/uL (ref 0.7–4.0)
Lymphs Abs: 2382 cells/uL (ref 850–3900)
MCH: 26.1 pg (ref 26.0–34.0)
MCH: 26.3 pg — ABNORMAL LOW (ref 27.0–33.0)
MCHC: 31.3 g/dL (ref 30.0–36.0)
MCHC: 31.5 g/dL — ABNORMAL LOW (ref 32.0–36.0)
MCV: 83.6 fL (ref 80.0–100.0)
MCV: 83.6 fL (ref 80.0–100.0)
MPV: 10.9 fL (ref 7.5–12.5)
Monocytes Absolute: 1.2 10*3/uL — ABNORMAL HIGH (ref 0.1–1.0)
Monocytes Relative: 12 %
Monocytes Relative: 9.6 %
Neutro Abs: 4183 cells/uL (ref 1500–7800)
Neutro Abs: 6.1 10*3/uL (ref 1.7–7.7)
Neutrophils Relative %: 50.4 %
Neutrophils Relative %: 64 %
Platelets: 345 10*3/uL (ref 150–400)
Platelets: 402 10*3/uL — ABNORMAL HIGH (ref 140–400)
RBC: 4.02 MIL/uL (ref 3.87–5.11)
RBC: 4.26 10*6/uL (ref 3.80–5.10)
RDW: 14.6 % (ref 11.0–15.0)
RDW: 15.4 % (ref 11.5–15.5)
Total Lymphocyte: 28.7 %
WBC: 8.3 10*3/uL (ref 3.8–10.8)
WBC: 9.5 10*3/uL (ref 4.0–10.5)
nRBC: 0 % (ref 0.0–0.2)

## 2020-10-07 LAB — GLUCOSE, CAPILLARY
Glucose-Capillary: 98 mg/dL (ref 70–99)
Glucose-Capillary: 139 mg/dL — ABNORMAL HIGH (ref 70–99)
Glucose-Capillary: 134 mg/dL — ABNORMAL HIGH (ref 70–99)
Glucose-Capillary: 130 mg/dL — ABNORMAL HIGH (ref 70–99)
Glucose-Capillary: 144 mg/dL — ABNORMAL HIGH (ref 70–99)

## 2020-10-07 LAB — URIC ACID: Uric Acid, Serum: 6.2 mg/dL (ref 2.5–7.1)

## 2020-10-07 LAB — BASIC METABOLIC PANEL
Anion gap: 9 (ref 5–15)
BUN: 20 mg/dL (ref 8–23)
CO2: 29 mmol/L (ref 22–32)
Calcium: 9.2 mg/dL (ref 8.9–10.3)
Chloride: 97 mmol/L — ABNORMAL LOW (ref 98–111)
Creatinine, Ser: 0.97 mg/dL (ref 0.44–1.00)
GFR, Estimated: >60 mL/min (ref >60)
Glucose, Bld: 147 mg/dL — ABNORMAL HIGH (ref 70–99)
Potassium: 3.8 mmol/L (ref 3.5–5.1)
Sodium: 135 mmol/L (ref 135–145)

## 2020-10-07 LAB — RHEUMATOID ARTHRITIS DIAGNOSTIC PANEL, COMPREHENSIVE
Cyclic Citrullin Peptide Ab: <16 Units (ref <20)
Rheumatoid Factor (IgA): <5 U (ref <=6)
Rheumatoid Factor (IgG): <5 U (ref <=6)
Rheumatoid Factor (IgM): <5 U (ref <=6)
SSA (Ro) (ENA) Antibody, IgG: <1 AI
SSB (La) (ENA) Antibody, IgG: <1 AI

## 2020-10-07 MED ORDER — SODIUM CHLORIDE 0.9 % IV SOLN
2.0000 g | INTRAVENOUS | Status: DC
  Administered 2020-10-07: 2 g via INTRAVENOUS
  Filled 2020-10-07: qty 20
  Filled 2020-10-07: qty 2

## 2020-10-07 MED ORDER — MUSCLE RUB 10-15 % EX CREA
1.0000 "application " | TOPICAL_CREAM | CUTANEOUS | Status: DC | PRN
  Administered 2020-10-07: 1 via TOPICAL
  Filled 2020-10-07: qty 85

## 2020-10-07 MED ORDER — COLCHICINE 0.6 MG PO TABS
0.6000 mg | ORAL_TABLET | Freq: Two times a day (BID) | ORAL | Status: DC
  Administered 2020-10-08 – 2020-10-09 (×3): 0.6 mg via ORAL
  Filled 2020-10-07 (×3): qty 1

## 2020-10-07 MED ORDER — ACETAMINOPHEN 325 MG PO TABS
650.0000 mg | ORAL_TABLET | Freq: Four times a day (QID) | ORAL | Status: DC
  Administered 2020-10-07 – 2020-10-09 (×4): 650 mg via ORAL
  Filled 2020-10-07 (×4): qty 2

## 2020-10-07 MED ORDER — HYDROMORPHONE HCL 1 MG/ML IJ SOLN
0.5000 mg | INTRAMUSCULAR | Status: DC | PRN
  Administered 2020-10-07 – 2020-10-08 (×5): 0.5 mg via INTRAVENOUS
  Filled 2020-10-07 (×5): qty 0.5

## 2020-10-07 MED ORDER — OXYCODONE HCL ER 10 MG PO T12A
10.0000 mg | EXTENDED_RELEASE_TABLET | Freq: Two times a day (BID) | ORAL | Status: DC
  Administered 2020-10-07 – 2020-10-09 (×4): 10 mg via ORAL
  Filled 2020-10-07 (×4): qty 1

## 2020-10-07 MED ORDER — COLCHICINE 0.6 MG PO TABS
1.2000 mg | ORAL_TABLET | Freq: Once | ORAL | Status: AC
  Administered 2020-10-07: 1.2 mg via ORAL
  Filled 2020-10-07: qty 2

## 2020-10-07 MED ORDER — CYCLOBENZAPRINE HCL 5 MG PO TABS
5.0000 mg | ORAL_TABLET | Freq: Three times a day (TID) | ORAL | Status: DC
  Administered 2020-10-07 – 2020-10-09 (×4): 5 mg via ORAL
  Filled 2020-10-07 (×4): qty 1

## 2020-10-07 MED ORDER — METHOCARBAMOL 500 MG PO TABS
500.0000 mg | ORAL_TABLET | Freq: Once | ORAL | Status: AC
  Administered 2020-10-07: 500 mg via ORAL
  Filled 2020-10-07: qty 1

## 2020-10-07 MED ORDER — ALLOPURINOL 100 MG PO TABS
100.0000 mg | ORAL_TABLET | Freq: Every day | ORAL | Status: DC
  Administered 2020-10-07 – 2020-10-09 (×3): 100 mg via ORAL
  Filled 2020-10-07 (×3): qty 1

## 2020-10-07 MED ORDER — OXYCODONE HCL 5 MG PO TABS
5.0000 mg | ORAL_TABLET | ORAL | Status: DC | PRN
  Administered 2020-10-07 – 2020-10-08 (×3): 5 mg via ORAL
  Filled 2020-10-07 (×3): qty 1

## 2020-10-07 NOTE — Progress Notes (Addendum)
PROGRESS NOTE    Samantha Mcgrath  XIP:[id number] DOB: 1950-11-12 DOA: 10/04/2020 PCP: Sandrea Hughs, NP    Brief Narrative:  Mrs. Hink was admitted to the hospital with the working diagnosis of right foot second toe osteomyelitis, infected gouty tophi.   70 year old female with a past medical history of type II diabetes mellitus, hypertension, dyslipidemia and obesity class II, who presents with worsening edema and erythema at the her right foot second toe.  As an outpatient she failed antibiotic therapy with doxycycline.  She had 3 days of fevers and worsening back pain.  On her initial physical examination blood pressure 165/75, heart rate 78, respiratory rate 20, oxygen saturation 94%, lungs are clear to auscultation bilaterally, heart S1-S2, present, rhythmic, soft abdomen, right foot second toe with edema, erythema, discoloration and purulence.  Sodium 134, potassium 3.0, chloride 93, bicarb 27, glucose 94, BUN 34, creatinine 1.20, white count 14.1, hemoglobin 12.6, hematocrit 40.1, platelets 374. SARS COVID-19 negative.  Patient was placed on broad-spectrum IV antibiotic therapy, podiatry was consulted.  Underwent 05/04 right foot second toe amputation, found osteomyelitis with significant gouty tophi.   Continue to have back pain, moderate to severe in intensity, has difficulty getting out of the bed.     Assessment & Plan:   Principal Problem:   Osteomyelitis due to type 2 diabetes mellitus (HCC) Active Problems:   Asthma   Essential hypertension   Gastroesophageal reflux disease without esophagitis   Chronic renal failure, stage 3a (HCC)   Type 2 diabetes mellitus without complication, with long-term current use of insulin (HCC)   Osteomyelitis of ankle or foot, acute, right ([place])   1. Right foot 2nd toe osteomyelitis, infected gouty tophi.  Sp amputation with good toleration, surgical cultures still pending.  Culture from wound on 04/28 with Streptococcus.    Antibiotic therapy with IV ceftriaxone.   Check uric acid and start patient on colchicine and allopurinol combination. Will need outpatient follow up. In the past she had gouty attack on left foot.   2. T2DM/ dyslipidemia. Capillary glucose has been controlled, this am 139, continue with insulin sliding scale for glucose cover and monitoring.    On statin therapy.   3. CKD stage 3. hypokalemia. Check BMP today, follow up on renal function and electrolytes, avoid nephrotoxic medications or hypotension.    4. HTN. Discontinue chlorthalidone (prevent hyperuricemia) and contin with losartan.    5. GERD. On pantoprazole.   6. Acute on chronic back pain/ obesity class 2. Calculated BMI is 36,6  Persistent pain, likely due to degenerative and disc disease. Further work up with spine films, lumbar spine. No role for MRI at this point, symptoms likely from degenerative disease and disc disease.   Add long acting analgesia with SR oxycodone and break through IR oxycodone and hydromorphone.    Pending PT and OT.     Patient continue to be at high risk for worsening back pain   Status is: Inpatient  Remains inpatient appropriate because:IV treatments appropriate due to intensity of illness or inability to take PO   Dispo: The patient is from: Home              Anticipated d/c is to: Home              Patient currently is not medically stable to d/c.   Difficult to place patient No   DVT prophylaxis: Enoxaparin   Code Status:   full  Family Communication:  No family at the  bedside     Consultants:   Podiatry   Procedures:   Right foot 2nd toe amputation   Antimicrobials:   Ceftriaxone     Subjective: Patient with persistent back pain, worse with movement, improved with hydromorphone, moderate to severe in intensity, almost constantly, no nausea or vomiting associated.   Objective: Vitals:   10/06/20 2212 10/06/20 2312 10/07/20 0215 10/07/20 0613  BP: (!)  151/53 (!) 138/58 130/61 119/61  Pulse: 84 79 72 74  Resp: 16 17 16 16   Temp: 98.5 F (36.9 C) 98.5 F (36.9 C) 98.6 F (37 C) 98.1 F (36.7 C)  TempSrc: Oral Oral Oral Oral  SpO2: 95% 94% 91% 98%  Weight:      Height:        Intake/Output Summary (Last 24 hours) at 10/07/2020 1149 Last data filed at 10/07/2020 0600 Gross per 24 hour  Intake 1055.07 ml  Output 1205 ml  Net -149.93 ml   Filed Weights   10/05/20 0951 10/06/20 0523 10/06/20 1623  Weight: 105.5 kg 103 kg 103 kg    Examination:   General: Not in pain or dyspnea, deconditioned  Neurology: Awake and alert, non focal  E ENT: no pallor, no icterus, oral mucosa moist Cardiovascular: No JVD. S1-S2 present, rhythmic, no gallops, rubs, or murmurs. No lower extremity edema. Pulmonary: positive breath sounds bilaterally, with no wheezing, rhonchi or rales. Gastrointestinal. Abdomen soft and non tender Skin. No rashes Musculoskeletal: right foot 2nd toe amputation, positive back pain.      Data Reviewed: I have personally reviewed following labs and imaging studies  CBC: Recent Labs  Lab 10/04/20 1909 10/05/20 0302 10/06/20 0528  WBC 14.1* 11.0* 9.7  NEUTROABS 10.2*  --  5.5  HGB 12.6 10.8* 10.3*  HCT 40.1 33.4* 32.0*  MCV 83.7 83.3 83.6  PLT 374 315 893   Basic Metabolic Panel: Recent Labs  Lab 10/04/20 1909 10/05/20 0302 10/06/20 0528  NA 134* 133* 135  K 3.0* 2.9* 3.2*  CL 93* 94* 97*  CO2 27 26 29   GLUCOSE 94 90 122*  BUN 34* 31* 33*  CREATININE 1.20* 1.07* 1.21*  CALCIUM 9.7 9.1 8.9  MG  --  1.3* 1.6*  PHOS  --  3.3  --    GFR: Estimated Creatinine Clearance: 51.7 mL/min (A) (by C-G formula based on SCr of 1.21 mg/dL (H)). Liver Function Tests: Recent Labs  Lab 10/04/20 1909 10/05/20 0302  AST 20 17  ALT 16 14  ALKPHOS 85 66  BILITOT 0.6 0.9  PROT 8.4* 6.9  ALBUMIN 3.9 3.2*   No results for input(s): LIPASE, AMYLASE in the last 168 hours. No results for input(s): AMMONIA in the  last 168 hours. Coagulation Profile: Recent Labs  Lab 10/05/20 0302  INR 1.1   Cardiac Enzymes: No results for input(s): CKTOTAL, CKMB, CKMBINDEX, TROPONINI in the last 168 hours. BNP (last 3 results) No results for input(s): PROBNP in the last 8760 hours. HbA1C: Recent Labs    10/05/20 0259  HGBA1C 7.4*   CBG: Recent Labs  Lab 10/06/20 1150 10/06/20 1644 10/06/20 1900 10/06/20 2109 10/07/20 0746  GLUCAP 145* 97 128* 98 139*   Lipid Profile: No results for input(s): CHOL, HDL, LDLCALC, TRIG, CHOLHDL, LDLDIRECT in the last 72 hours. Thyroid Function Tests: Recent Labs    10/05/20 0259  TSH 3.274   Anemia Panel: No results for input(s): VITAMINB12, FOLATE, FERRITIN, TIBC, IRON, RETICCTPCT in the last 72 hours.    Radiology Studies: I  have reviewed all of the imaging during this hospital visit personally     Scheduled Meds: . allopurinol  100 mg Oral Daily  . atorvastatin  10 mg Oral Daily  . chlorthalidone  25 mg Oral Daily  . [START ON 10/08/2020] colchicine  0.6 mg Oral BID  . DULoxetine  20 mg Oral QHS  . heparin  5,000 Units Subcutaneous Q8H  . insulin aspart  0-15 Units Subcutaneous TID WC  . insulin aspart  0-5 Units Subcutaneous QHS  . losartan  25 mg Oral Daily  . montelukast  10 mg Oral QHS  . pantoprazole  40 mg Oral BID   Continuous Infusions: . cefTRIAXone (ROCEPHIN)  IV       LOS: 3 days        Leighanne Adolph Gerome Apley, MD

## 2020-10-07 NOTE — Care Management Important Message (Signed)
Important Message  Patient Details IM Letter given to the Patient. Name: Samantha Mcgrath MRN: [id number] Date of Birth: Apr 03, 1951   Medicare Important Message Given:  Yes     Kerin Salen 10/07/2020, 11:43 AM

## 2020-10-07 NOTE — Progress Notes (Signed)
Subjective: Samantha Mcgrath is a 70 y.o. female patient seen at bedside, resting comfortably in no acute distress s/p day #1 Right 2nd toe amputation secondary to osteomyelitis with clinical gouty tophi identified during surgery. Patient denies pain at surgical site, reports that she has more pain in her knee and her back, denies calf pain, denies headache, chest pain, shortness of breath, nausea, vomitting, denies loss of appetite, denies problems with voiding. No other issues noted.   Patient Active Problem List   Diagnosis Date Noted  . Osteomyelitis due to type 2 diabetes mellitus ([place]) 10/04/2020  . Osteomyelitis of ankle or foot, acute, right ([place]) 10/04/2020  . Chronic renal failure, stage 3a ([place]) 10/08/2019  . Anxiety 07/08/2019  . Combined forms of age-related cataract of right eye 08/15/2018  . Gastroesophageal reflux disease without esophagitis 01/29/2018  . Asthma 01/28/2018  . Hyperlipidemia 01/28/2018  . Type 2 diabetes mellitus without complication, with long-term current use of insulin ([place]) 01/28/2018  . Retinal detachment of left eye with multiple breaks 11/14/2016  . Morbid obesity ([place]) 08/17/2015  . Essential hypertension 06/01/2014     Current Facility-Administered Medications:  .  acetaminophen (TYLENOL) tablet 650 mg, 650 mg, Oral, Q6H PRN, 650 mg at 10/06/20 2338 **OR** acetaminophen (TYLENOL) suppository 650 mg, 650 mg, Rectal, Q6H PRN, Florina Ou V, MD .  atorvastatin (LIPITOR) tablet 10 mg, 10 mg, Oral, Daily, Florina Ou V, MD, 10 mg at 10/05/20 0959 .  chlorthalidone (HYGROTON) tablet 25 mg, 25 mg, Oral, Daily, Florina Ou V, MD, 25 mg at 10/05/20 1227 .  DULoxetine (CYMBALTA) DR capsule 20 mg, 20 mg, Oral, QHS, Arrien, Jimmy Picket, MD, 20 mg at 10/06/20 2138 .  heparin injection 5,000 Units, 5,000 Units, Subcutaneous, Q8H, Para Skeans, MD, 5,000 Units at 10/07/20 0542 .  hydrALAZINE (APRESOLINE) injection 10 mg, 10 mg, Intravenous, Q6H PRN, Florina Ou  V, MD .  HYDROmorphone (DILAUDID) injection 0.5 mg, 0.5 mg, Intravenous, Q4H PRN, Para Skeans, MD, 0.5 mg at 10/07/20 0543 .  insulin aspart (novoLOG) injection 0-15 Units, 0-15 Units, Subcutaneous, TID WC, Para Skeans, MD, 2 Units at 10/06/20 1214 .  insulin aspart (novoLOG) injection 0-5 Units, 0-5 Units, Subcutaneous, QHS, Patel, Ekta V, MD .  losartan (COZAAR) tablet 25 mg, 25 mg, Oral, Daily, Dwyane Dee, MD, 25 mg at 10/05/20 1427 .  montelukast (SINGULAIR) tablet 10 mg, 10 mg, Oral, QHS, Para Skeans, MD, 10 mg at 10/06/20 2137 .  Muscle Rub CREA 1 application, 1 application, Topical, PRN, Arrien, Jimmy Picket, MD, 1 application at 40/97/35 0036 .  ondansetron (ZOFRAN) tablet 4 mg, 4 mg, Oral, Q6H PRN **OR** ondansetron (ZOFRAN) injection 4 mg, 4 mg, Intravenous, Q6H PRN, Para Skeans, MD, 4 mg at 10/05/20 2100 .  pantoprazole (PROTONIX) EC tablet 40 mg, 40 mg, Oral, BID, Para Skeans, MD, 40 mg at 10/06/20 2137 .  traMADol (ULTRAM) tablet 50 mg, 50 mg, Oral, Q6H PRN, Para Skeans, MD, 50 mg at 10/06/20 2335  Allergies  Allergen Reactions  . Procaine Other (See Comments) and Palpitations    ALSO BLISTERS WITH TOPICAL Novacaine   Novocain. ALSO BLISTERS WITH TOPICAL     Objective: Today's Vitals   10/07/20 0134 10/07/20 0215 10/07/20 0545 10/07/20 0613  BP:  130/61  119/61  Pulse:  72  74  Resp:  16  16  Temp:  98.6 F (37 C)  98.1 F (36.7 C)  TempSrc:  Oral  Oral  SpO2:  91%  98%  Weight:      Height:      PainSc: 10-Worst pain ever Asleep 8  6     General: No acute distress  Right Lower extremity: Dressing to Right foot clean, dry, intact. No strikethrough noted, Upon removal of dressings sutures intact with no dehiscence. Decreased erythema, decreased edema, minimal bloody drainage. No other acute signs of infection. No calf pain. Range of motion excluding surgical site within normal limits with no pain or crepitation.  S/p 2nd toe amputation.    Post  Op Xray, Right foot:  IMPRESSION: Status post amputation of second digit at the level of MTP joint  Assessment and Plan:  Problem List Items Addressed This Visit      Endocrine   * (Principal) Osteomyelitis due to type 2 diabetes mellitus (HCC)   Relevant Medications   insulin aspart (novoLOG) injection 0-15 Units   insulin aspart (novoLOG) injection 0-5 Units   atorvastatin (LIPITOR) tablet 10 mg   losartan (COZAAR) tablet 25 mg    Other Visit Diagnoses    Osteomyelitis of right foot, unspecified type ([place])    -  Primary   S/P amputation of lesser toe, right (HCC)       Relevant Orders   DG Foot 2 Views Right (Completed)      -Patient seen and evaluated at bedside -Chart reviewed -Dressing change preformed; applied dry dressing to right foot -Advised patient to make sure to keep dressing clean, dry, and intact to Right foot allowing nursing to change dressings as ordered if there is strike through  -Awaiting intra-op cultures; continue with IV antibiotics until pre-limb cultures have resulted  -Weightbearing to heel with post op shoe for bedside and bathroom only with assistive device; recommend PT to work with patient prior to discharge for gait training and mobility needs -Continue with rest and elevation to assist with pain and edema control  -Recommend medical management for gout -Podiatry will continue to follow closely  -Anticipated discharge plan of care: To home tomorrow with antibiotics and meds for gout. Patient will follow up in office for dressing changes weekly; patient to keep dressings clean dry and intact and does not need any home nursing for wound care at this time. Office will contact patient for follow up appointment after discharge.   Dr. Landis Martins, DPM [facility] [phone] office [phone] cell Available via secure chat

## 2020-10-07 NOTE — Evaluation (Signed)
Physical Therapy Evaluation Patient Details Name: Samantha Mcgrath MRN: [id number] DOB: May 26, 1951 Today's Date: 10/07/2020   History of Present Illness  Patient is 70 y.o. female whoe present to [facility] with worsening edema and erythema at the her right foot/second toe and 3 days of pain and fever. Patient admitted 10/04/20 with working diagnosis of right second toe osteomyelitis, infected gouty tophi. She is now s/p Rt second toe amputation on 10/06/20 for osteomyelitis with significant gouty tophi.  PMH significant for HTN, HLD, gout, GERD, DM, asthma, depression.    Clinical Impression  Samantha Mcgrath is 70 y.o. female admitted with above HPI and diagnosis. Patient is currently limited by functional impairments below (see PT problem list). Patient lives alone and is independent at baseline. She is now POD1 s/p Rt 2nd toe amputation and have been limited by new back and Lt knee pain. Pt required min assist to complete bed mobility and Mod Assist for sit<>stand with RW and cues for Rt heel weight bearing. Pt was able to take 2 small side steps at EOB but greatly limited by Lt knee pain and became dizzy and diaphoretic. Symptoms improved once returned to supine and provided cool washcloth. Patient will benefit from continued skilled PT interventions to address impairments and progress independence with mobility, recommending HHPT vs ST rehab at SNF pendign progress in acute setting. Anticipate pt will improve well once pain controled. Acute PT will follow and progress as able.     Follow Up Recommendations SNF;Home health PT (SNF vs HHPT pending pt progress with acute PT - antcipate as pain is controlled pt will progress well)    Equipment Recommendations  Rolling walker with 5" wheels;3in1 (PT)    Recommendations for Other Services       Precautions / Restrictions Precautions Precautions: Fall Restrictions Weight Bearing Restrictions: No      Mobility  Bed Mobility Overal bed mobility: Needs  Assistance Bed Mobility: Supine to Sit;Sit to Supine     Supine to sit: HOB elevated;Min assist Sit to supine: Min assist;HOB elevated   General bed mobility comments: cues to use bed rail and assist for Lt LE to bring off EOB fully. assist to raise trunk and pivot/scoot forward to EOB. Min assist to raise Lt LE back up into bed and cues for use of grab bars to scoot superiorly and reposition.    Transfers Overall transfer level: Needs assistance Equipment used: Rolling walker (2 wheeled) Transfers: Sit to/from Stand Sit to Stand: Mod assist;From elevated surface         General transfer comment: Blocking at Lt knee to facilitate extension as pt initiated stand. Mod assist to complete rise from elevated EOB. Pt putting out extra effort and heavily reliant on UE support on RW to obtain upright posture.  Ambulation/Gait                Stairs            Wheelchair Mobility    Modified Rankin (Stroke Patients Only)       Balance Overall balance assessment: Needs assistance Sitting-balance support: Feet supported Sitting balance-Leahy Scale: Fair     Standing balance support: During functional activity;Bilateral upper extremity supported Standing balance-Leahy Scale: Poor Standing balance comment: haevy reliance on RW due to Lt knee pain                             Pertinent Vitals/Pain Pain Assessment: Faces Faces Pain  Scale: Hurts whole lot Pain Location: Lt knee Pain Descriptors / Indicators: Grimacing;Guarding;Sharp Pain Intervention(s): Limited activity within patient's tolerance;Monitored during session;Repositioned;Ice applied;Premedicated before session    Home Living Family/patient expects to be discharged to:: Private residence Living Arrangements: Alone Available Help at Discharge: Family Type of Home: House Home Access: Stairs to enter Entrance Stairs-Rails: None Technical brewer of Steps: 2 Home Layout: One level Home  Equipment: None;Grab bars - toilet Additional Comments: pt's daughter can assist her as needed    Prior Function Level of Independence: Independent               Hand Dominance   Dominant Hand: Left    Extremity/Trunk Assessment   Upper Extremity Assessment Upper Extremity Assessment: Overall WFL for tasks assessed;Defer to OT evaluation    Lower Extremity Assessment Lower Extremity Assessment: LLE deficits/detail LLE Deficits / Details: weak secondary to knee pain. LLE: Unable to fully assess due to pain LLE Sensation: WNL LLE Coordination: WNL    Cervical / Trunk Assessment Cervical / Trunk Assessment: Normal  Communication   Communication: No difficulties  Cognition Arousal/Alertness: Awake/alert Behavior During Therapy: WFL for tasks assessed/performed Overall Cognitive Status: Within Functional Limits for tasks assessed                                        General Comments      Exercises     Assessment/Plan    PT Assessment Patient needs continued PT services  PT Problem List Decreased strength;Decreased range of motion;Decreased activity tolerance;Decreased balance;Decreased mobility;Decreased knowledge of use of DME;Decreased knowledge of precautions;Pain;Obesity       PT Treatment Interventions DME instruction;Gait training;Stair training;Functional mobility training;Therapeutic activities;Therapeutic exercise;Balance training;Patient/family education    PT Goals (Current goals can be found in the Care Plan section)  Acute Rehab PT Goals Patient Stated Goal: get independent again PT Goal Formulation: With patient Time For Goal Achievement: 10/14/20 Potential to Achieve Goals: Good    Frequency Min 3X/week   Barriers to discharge        Co-evaluation               AM-PAC PT "6 Clicks" Mobility  Outcome Measure Help needed turning from your back to your side while in a flat bed without using bedrails?: A  Little Help needed moving from lying on your back to sitting on the side of a flat bed without using bedrails?: A Little Help needed moving to and from a bed to a chair (including a wheelchair)?: A Lot Help needed standing up from a chair using your arms (e.g., wheelchair or bedside chair)?: A Lot Help needed to walk in hospital room?: A Lot Help needed climbing 3-5 steps with a railing? : Total 6 Click Score: 13    End of Session Equipment Utilized During Treatment: Gait belt;Other (comment) (post op shoe for Rt foot) Activity Tolerance: Patient tolerated treatment well Patient left: in bed;with call bell/phone within reach;with bed alarm set;with family/visitor present Nurse Communication: Mobility status;Patient requests pain meds PT Visit Diagnosis: Muscle weakness (generalized) (M62.81);Difficulty in walking, not elsewhere classified (R26.2);Pain Pain - Right/Left: Left Pain - part of body: Knee    Time: 1634-1710 PT Time Calculation (min) (ACUTE ONLY): 36 min   Charges:   PT Evaluation $PT Eval Moderate Complexity: 1 Mod PT Treatments $Therapeutic Activity: 8-22 mins        Gwynneth Albright PT, DPT  Acute Rehabilitation Services Office [phone] Pager [phone]    Jacques Navy 10/07/2020, 5:59 PM

## 2020-10-08 ENCOUNTER — Other Ambulatory Visit: Payer: Self-pay | Admitting: Family

## 2020-10-08 DIAGNOSIS — I10 Essential (primary) hypertension: Secondary | ICD-10-CM

## 2020-10-08 LAB — BASIC METABOLIC PANEL
Anion gap: 14 (ref 5–15)
BUN: 22 mg/dL (ref 8–23)
CO2: 26 mmol/L (ref 22–32)
Calcium: 8.9 mg/dL (ref 8.9–10.3)
Chloride: 94 mmol/L — ABNORMAL LOW (ref 98–111)
Creatinine, Ser: 0.95 mg/dL (ref 0.44–1.00)
GFR, Estimated: >60 mL/min (ref >60)
Glucose, Bld: 120 mg/dL — ABNORMAL HIGH (ref 70–99)
Potassium: 3.5 mmol/L (ref 3.5–5.1)
Sodium: 134 mmol/L — ABNORMAL LOW (ref 135–145)

## 2020-10-08 LAB — CBC WITH DIFFERENTIAL/PLATELET
Abs Immature Granulocytes: 0.25 10*3/uL — ABNORMAL HIGH (ref 0.00–0.07)
Basophils Absolute: 0.1 10*3/uL (ref 0.0–0.1)
Basophils Relative: 1 %
Eosinophils Absolute: 0.4 10*3/uL (ref 0.0–0.5)
Eosinophils Relative: 4 %
HCT: 32.3 % — ABNORMAL LOW (ref 36.0–46.0)
Hemoglobin: 10.1 g/dL — ABNORMAL LOW (ref 12.0–15.0)
Immature Granulocytes: 3 %
Lymphocytes Relative: 18 %
Lymphs Abs: 1.7 10*3/uL (ref 0.7–4.0)
MCH: 26.6 pg (ref 26.0–34.0)
MCHC: 31.3 g/dL (ref 30.0–36.0)
MCV: 85.2 fL (ref 80.0–100.0)
Monocytes Absolute: 1.1 10*3/uL — ABNORMAL HIGH (ref 0.1–1.0)
Monocytes Relative: 12 %
Neutro Abs: 6.2 10*3/uL (ref 1.7–7.7)
Neutrophils Relative %: 62 %
Platelets: 354 10*3/uL (ref 150–400)
RBC: 3.79 MIL/uL — ABNORMAL LOW (ref 3.87–5.11)
RDW: 15.6 % — ABNORMAL HIGH (ref 11.5–15.5)
WBC: 9.8 10*3/uL (ref 4.0–10.5)
nRBC: 0 % (ref 0.0–0.2)

## 2020-10-08 LAB — SURGICAL PATHOLOGY

## 2020-10-08 LAB — GLUCOSE, CAPILLARY
Glucose-Capillary: 118 mg/dL — ABNORMAL HIGH (ref 70–99)
Glucose-Capillary: 133 mg/dL — ABNORMAL HIGH (ref 70–99)
Glucose-Capillary: 121 mg/dL — ABNORMAL HIGH (ref 70–99)

## 2020-10-08 MED ORDER — AMOXICILLIN-POT CLAVULANATE 875-125 MG PO TABS
1.0000 | ORAL_TABLET | Freq: Two times a day (BID) | ORAL | Status: DC
  Administered 2020-10-08 – 2020-10-09 (×3): 1 via ORAL
  Filled 2020-10-08 (×3): qty 1

## 2020-10-08 MED ORDER — DICLOFENAC SODIUM 1 % EX GEL
2.0000 g | Freq: Four times a day (QID) | CUTANEOUS | Status: DC
  Administered 2020-10-08 – 2020-10-09 (×3): 2 g via TOPICAL
  Filled 2020-10-08: qty 100

## 2020-10-08 MED ORDER — ENOXAPARIN SODIUM 40 MG/0.4ML IJ SOSY
40.0000 mg | PREFILLED_SYRINGE | INTRAMUSCULAR | Status: DC
  Administered 2020-10-08: 40 mg via SUBCUTANEOUS
  Filled 2020-10-08: qty 0.4

## 2020-10-08 NOTE — Progress Notes (Signed)
Physical Therapy Treatment Patient Details Name: Samantha Mcgrath MRN: [id number] DOB: 12-01-1950 Today's Date: 10/08/2020    History of Present Illness Patient is 70 y.o. female whoe present to [facility] with worsening edema and erythema at the her right foot/second toe and 3 days of pain and fever. Patient admitted 10/04/20 with working diagnosis of right second toe osteomyelitis, infected gouty tophi. She is now s/p Rt second toe amputation on 10/06/20 for osteomyelitis with significant gouty tophi.  PMH significant for HTN, HLD, gout, GERD, DM, asthma, depression.    PT Comments    Pt feeling much better and with marked improvement in L knee pain and activity tolerance.  Pt up to ambulate to/from bathroom with increased time and min guard assist.  Pt defers stairs stating she plans to be bumped up in [place].  Pt would benefit from follow up HHPT and use of RW and 3n1.  Follow Up Recommendations  Home health PT     Equipment Recommendations  Rolling walker with 5" wheels;3in1 (PT)    Recommendations for Other Services       Precautions / Restrictions Precautions Precautions: Fall Precaution Comments: WB through heel for bedside or BR only Restrictions Weight Bearing Restrictions: Yes RLE Weight Bearing: Partial weight bearing RLE Partial Weight Bearing Percentage or Pounds: 50%    Mobility  Bed Mobility Overal bed mobility: Needs Assistance Bed Mobility: Supine to Sit     Supine to sit: HOB elevated;Supervision     General bed mobility comments: increased time - use of bed rails to transfer to side of bed    Transfers Overall transfer level: Needs assistance Equipment used: Rolling walker (2 wheeled) Transfers: Sit to/from Stand Sit to Stand: Min assist         General transfer comment: Min assist to stand from elevated bed height and initial steadying assistance. Min assist to rise from toilet height as well.  Ambulation/Gait Ambulation/Gait assistance: Min assist;Min  guard Gait Distance (Feet): 15 Feet (15' twice to/from bathroom) Assistive device: Rolling walker (2 wheeled) Gait Pattern/deviations: Step-to pattern;Decreased step length - right;Decreased step length - left;Shuffle;Trunk flexed Gait velocity: decr   General Gait Details: cues for sequence, posture and position from [employer]             Wheelchair Mobility    Modified Rankin (Stroke Patients Only)       Balance Overall balance assessment: Needs assistance Sitting-balance support: Feet supported Sitting balance-Leahy Scale: Good     Standing balance support: During functional activity;Bilateral upper extremity supported Standing balance-Leahy Scale: Poor Standing balance comment: haevy reliance on RW due to Lt knee pain                            Cognition Arousal/Alertness: Awake/alert Behavior During Therapy: WFL for tasks assessed/performed Overall Cognitive Status: Within Functional Limits for tasks assessed                                        Exercises General Exercises - Lower Extremity Ankle Circles/Pumps: AROM    General Comments        Pertinent Vitals/Pain Pain Assessment: Faces Faces Pain Scale: Hurts a little bit Pain Location: Lt knee Pain Descriptors / Indicators: Aching;Sore Pain Intervention(s): Limited activity within patient's tolerance;Monitored during session;Premedicated before session    Home Living Family/patient expects to be  discharged to:: Private residence Living Arrangements: Alone Available Help at Discharge: Family Type of Home: House Home Access: Stairs to enter Entrance Stairs-Rails: None Home Layout: One level Home Equipment: None;Grab bars - toilet Additional Comments: pt's daughter can assist her as needed    Prior Function Level of Independence: Independent          PT Goals (current goals can now be found in the care plan section) Acute Rehab PT Goals Patient Stated Goal:  get independent again PT Goal Formulation: With patient Time For Goal Achievement: 10/14/20 Potential to Achieve Goals: Good Progress towards PT goals: Progressing toward goals    Frequency    Min 3X/week      PT Plan Current plan remains appropriate    Co-evaluation PT/OT/SLP Co-Evaluation/Treatment: Yes Reason for Co-Treatment: To address functional/ADL transfers PT goals addressed during session: Mobility/safety with mobility OT goals addressed during session: ADL's and self-care      AM-PAC PT "6 Clicks" Mobility   Outcome Measure  Help needed turning from your back to your side while in a flat bed without using bedrails?: A Little Help needed moving from lying on your back to sitting on the side of a flat bed without using bedrails?: A Little Help needed moving to and from a bed to a chair (including a wheelchair)?: A Little Help needed standing up from a chair using your arms (e.g., wheelchair or bedside chair)?: A Little Help needed to walk in hospital room?: A Little Help needed climbing 3-5 steps with a railing? : A Lot 6 Click Score: 17    End of Session Equipment Utilized During Treatment: Gait belt;Other (comment) Activity Tolerance: Patient tolerated treatment well Patient left: in chair;with call bell/phone within reach;with chair alarm set Nurse Communication: Mobility status;Patient requests pain meds PT Visit Diagnosis: Muscle weakness (generalized) (M62.81);Difficulty in walking, not elsewhere classified (R26.2);Pain Pain - Right/Left: Left Pain - part of body: Knee     Time: 1856-3149 PT Time Calculation (min) (ACUTE ONLY): 35 min  Charges:  $Gait Training: 8-22 mins                     Debe Coder PT [place] Pager [phone] Office [phone]    Jaiden Dinkins 10/08/2020, 12:59 PM

## 2020-10-08 NOTE — Progress Notes (Addendum)
PROGRESS NOTE    Samantha Mcgrath  VQQ:[id number] DOB: [birth year] DOA: 10/04/2020 PCP: Sandrea Hughs, NP    Brief Narrative:  Mrs. Varone was admitted to the hospital with the working diagnosis of right foot second toe osteomyelitis, infected gouty tophi.   70 year old female with a past medical history of type II diabetes mellitus, hypertension, dyslipidemia and obesity class II,who presents with worsening edema and erythema at the her right foot second toe. As an outpatient she failed antibiotic therapy with doxycycline. She had 3 days of fevers and worsening back pain. On her initial physical examination blood pressure 165/75, heart rate 78, respiratory rate 20, oxygen saturation 94%,lungs are clear to auscultation bilaterally, heart S1-S2, present, rhythmic, soft abdomen, right foot second toe with edema, erythema, discoloration and purulence.  Sodium 134, potassium 3.0, chloride 93, bicarb 27, glucose 94, BUN 34, creatinine 1.20, white count 14.1, hemoglobin 12.6, hematocrit 40.1, platelets 374. SARS COVID-19 negative.  Patient was placed on broad-spectrum IV antibiotic therapy, podiatry was consulted.  Underwent 05/04 right foot second toe amputation, found osteomyelitis with significant gouty tophi.   Continue to have back pain, moderate to severe in intensity, has difficulty getting out of the bed.    Further work up with lumbar spine and left knee radiographs showing degenerative joint disease.  Back pain slowly improving.    Assessment & Plan:   Principal Problem:   Osteomyelitis due to type 2 diabetes mellitus (HCC) Active Problems:   Asthma   Essential hypertension   Gastroesophageal reflux disease without esophagitis   Chronic renal failure, stage 3a (HCC)   Type 2 diabetes mellitus without complication, with long-term current use of insulin (HCC)   Osteomyelitis of ankle or foot, acute, right ([place])    1. Right foot 2nd toe osteomyelitis, infected gouty  tophi.  Sp amputation with good toleration. Culture from wound on 04/28 with Streptococcus, surgical culture with streptococcus.  Wbc at 9,8, patient has been afebrile.  Uric acid 6,2  Plan to transition to oral antibiotic therapy with Augmentin.   Continue with colchicine and allopurinol combination.  Patient will need home health services. Continue to encourage mobility. Add topical diclofenac to left knee.   2. T2DM/ dyslipidemia. fasting glucose this am is 120, patient tolerating po well, continue with statin.   3. CKD stage 2. hypokalemia. follow up renal function with serum cr at 0,96 with K at 3,5 and serum bicarbonate at 26.  Patient is tolerating po well, continue to avoid systemic non steroidal antiinflammatory medications for now.   4. HTN.Continue blood pressure control with losartan.   5. GERD. Continue with pantoprazole.   6. Acute on chronic back pain/ obesity class 2. Calculated BMI is 36,6   Back pain has improved but continue to have difficulty moving, radiographs with positive degenerative joint disease at the spine and left knee.   Continue with SR oxycodone and break through IR oxycodone and hydromorphone.  Add topical diclofenac to left knee.  Continue with duloxetine and cyclobenzaprine   Status is: Inpatient  Remains inpatient appropriate because:Inpatient level of care appropriate due to severity of illness   Dispo: The patient is from: Home              Anticipated d/c is to: Home              Patient currently is not medically stable to d/c. Plan for possible dc home in am.    Difficult to place patient No   DVT prophylaxis:  Enoxaparin   Code Status:   full  Family Communication:  No family at the bedside     Consultants:   Podiatry   Procedures:   Right foot 2nd toe amputation   Antimicrobials:   Augmentin     Subjective: Patient with improved back pain, but not yet back to baseline, continue to have left knee pain and  difficulty getting out of bed and ambulating.   Objective: Vitals:   10/07/20 0613 10/07/20 1753 10/07/20 2200 10/08/20 0511  BP: 119/61 140/61 (!) 135/59 129/68  Pulse: 74 66 63 73  Resp: 16 18 18 18   Temp: 98.1 F (36.7 C) 98.1 F (36.7 C) 97.8 F (36.6 C) 98 F (36.7 C)  TempSrc: Oral Oral Oral Oral  SpO2: 98% 92% 95% 95%  Weight:      Height:        Intake/Output Summary (Last 24 hours) at 10/08/2020 1232 Last data filed at 10/08/2020 0601 Gross per 24 hour  Intake 460.21 ml  Output 1050 ml  Net -589.79 ml   Filed Weights   10/05/20 0951 10/06/20 0523 10/06/20 1623  Weight: 105.5 kg 103 kg 103 kg    Examination:   General: Not in pain or dyspnea, deconditioned  Neurology: Awake and alert, non focal  E ENT: mild pallor, no icterus, oral mucosa moist Cardiovascular: No JVD. S1-S2 present, rhythmic, no gallops, rubs, or murmurs. No lower extremity edema. Pulmonary: positive breath sounds bilaterally, adequate air movement, no wheezing, rhonchi or rales. Gastrointestinal. Abdomen soft and non tender Skin. No rashes  Musculoskeletal: right foot 2nd toe amputation, hypertrophic knees bilaterally      Data Reviewed: I have personally reviewed following labs and imaging studies  CBC: Recent Labs  Lab 10/04/20 1909 10/05/20 0302 10/06/20 0528 10/07/20 1300 10/08/20 0636  WBC 14.1* 11.0* 9.7 9.5 9.8  NEUTROABS 10.2*  --  5.5 6.1 6.2  HGB 12.6 10.8* 10.3* 10.5* 10.1*  HCT 40.1 33.4* 32.0* 33.6* 32.3*  MCV 83.7 83.3 83.6 83.6 85.2  PLT 374 315 323 345 740   Basic Metabolic Panel: Recent Labs  Lab 10/04/20 1909 10/05/20 0302 10/06/20 0528 10/07/20 1302 10/08/20 0636  NA 134* 133* 135 135 134*  K 3.0* 2.9* 3.2* 3.8 3.5  CL 93* 94* 97* 97* 94*  CO2 27 26 29 29 26   GLUCOSE 94 90 122* 147* 120*  BUN 34* 31* 33* 20 22  CREATININE 1.20* 1.07* 1.21* 0.97 0.95  CALCIUM 9.7 9.1 8.9 9.2 8.9  MG  --  1.3* 1.6*  --   --   PHOS  --  3.3  --   --   --     GFR: Estimated Creatinine Clearance: 65.9 mL/min (by C-G formula based on SCr of 0.95 mg/dL). Liver Function Tests: Recent Labs  Lab 10/04/20 1909 10/05/20 0302  AST 20 17  ALT 16 14  ALKPHOS 85 66  BILITOT 0.6 0.9  PROT 8.4* 6.9  ALBUMIN 3.9 3.2*   No results for input(s): LIPASE, AMYLASE in the last 168 hours. No results for input(s): AMMONIA in the last 168 hours. Coagulation Profile: Recent Labs  Lab 10/05/20 0302  INR 1.1   Cardiac Enzymes: No results for input(s): CKTOTAL, CKMB, CKMBINDEX, TROPONINI in the last 168 hours. BNP (last 3 results) No results for input(s): PROBNP in the last 8760 hours. HbA1C: No results for input(s): HGBA1C in the last 72 hours. CBG: Recent Labs  Lab 10/07/20 1222 10/07/20 1748 10/07/20 2202 10/08/20 0728 10/08/20 1143  GLUCAP 134* 130* 144* 118* 133*   Lipid Profile: No results for input(s): CHOL, HDL, LDLCALC, TRIG, CHOLHDL, LDLDIRECT in the last 72 hours. Thyroid Function Tests: No results for input(s): TSH, T4TOTAL, FREET4, T3FREE, THYROIDAB in the last 72 hours. Anemia Panel: No results for input(s): VITAMINB12, FOLATE, FERRITIN, TIBC, IRON, RETICCTPCT in the last 72 hours.    Radiology Studies: I have reviewed all of the imaging during this hospital visit personally     Scheduled Meds: . acetaminophen  650 mg Oral QID  . allopurinol  100 mg Oral Daily  . amoxicillin-clavulanate  1 tablet Oral Q12H  . atorvastatin  10 mg Oral Daily  . colchicine  0.6 mg Oral BID  . cyclobenzaprine  5 mg Oral TID  . DULoxetine  20 mg Oral QHS  . heparin  5,000 Units Subcutaneous Q8H  . insulin aspart  0-15 Units Subcutaneous TID WC  . insulin aspart  0-5 Units Subcutaneous QHS  . losartan  25 mg Oral Daily  . montelukast  10 mg Oral QHS  . oxyCODONE  10 mg Oral Q12H  . pantoprazole  40 mg Oral BID   Continuous Infusions:   LOS: 4 days        Inez Rosato Gerome Apley, MD

## 2020-10-08 NOTE — Progress Notes (Signed)
Pt needs wide BSC and wide RW due to her body habitus.

## 2020-10-08 NOTE — Plan of Care (Signed)

## 2020-10-08 NOTE — Evaluation (Signed)
Occupational Therapy Evaluation Patient Details Name: Samantha Mcgrath MRN: [id number] DOB: 11-26-1950 Today's Date: 10/08/2020    History of Present Illness Patient is 70 y.o. female whoe present to [facility] with worsening edema and erythema at the her right foot/second toe and 3 days of pain and fever. Patient admitted 10/04/20 with working diagnosis of right second toe osteomyelitis, infected gouty tophi. She is now s/p Rt second toe amputation on 10/06/20 for osteomyelitis with significant gouty tophi.  PMH significant for HTN, HLD, gout, GERD, DM, asthma, depression.   Clinical Impression   Samantha Mcgrath is a 70 year old woman who presents with decreased ability to ambulate and perform ADLs secondary to right second toe amputation, left knee pain and heel wt bearing status. On evaluation patient needing min assist for standing, min guard for short ambulation and and min assist for ADLs. Patient will benefit from skilled OT services while in hospital to improve deficits and learn compensatory strategies as needed in order to improve to modified independent.       Follow Up Recommendations  Home health OT    Equipment Recommendations  Other (comment) (wide BSC, shower chair)    Recommendations for Other Services       Precautions / Restrictions Precautions Precautions: Fall Precaution Comments: WB through heel for bedside or BR only Restrictions Weight Bearing Restrictions: Yes RLE Weight Bearing: Partial weight bearing      Mobility Bed Mobility Overal bed mobility: Needs Assistance Bed Mobility: Supine to Sit     Supine to sit: HOB elevated;Supervision     General bed mobility comments: increased time - use of bed rails to transfer to side of bed    Transfers Overall transfer level: Needs assistance Equipment used: Rolling walker (2 wheeled) Transfers: Sit to/from Stand Sit to Stand: Min assist         General transfer comment: Min assist to stand from elevated bed  height and initial steadying assistance. Min assist to rise from toilet height as well. Min guard - min assist for steadying x 1 to ambulate in room.    Balance Overall balance assessment: Needs assistance   Sitting balance-Leahy Scale: Good     Standing balance support: During functional activity;Bilateral upper extremity supported Standing balance-Leahy Scale: Poor Standing balance comment: haevy reliance on RW due to Lt knee pain                           ADL either performed or assessed with clinical judgement   ADL Overall ADL's : Needs assistance/impaired Eating/Feeding: Independent   Grooming: Sitting;Set up   Upper Body Bathing: Set up;Sitting   Lower Body Bathing: Minimal assistance;Sit to/from stand   Upper Body Dressing : Set up;Sitting   Lower Body Dressing: Minimal assistance;Sit to/from stand Lower Body Dressing Details (indicate cue type and reason): able to don sock and shoe at side of bed - min assist for clothing management (pulling clothing up) Toilet Transfer: Min guard;RW;Regular Toilet;Grab bars   Toileting- Clothing Manipulation and Hygiene: Minimal assistance;Sit to/from stand               Vision Patient Visual Report: No change from baseline       Perception     Praxis      Pertinent Vitals/Pain Pain Assessment: Faces Faces Pain Scale: Hurts a little bit Pain Location: Lt knee Pain Descriptors / Indicators: Grimacing;Guarding Pain Intervention(s): Limited activity within patient's tolerance  Hand Dominance Left   Extremity/Trunk Assessment Upper Extremity Assessment Upper Extremity Assessment: Overall WFL for tasks assessed   Lower Extremity Assessment Lower Extremity Assessment: Defer to PT evaluation       Communication Communication Communication: No difficulties   Cognition Arousal/Alertness: Awake/alert Behavior During Therapy: WFL for tasks assessed/performed Overall Cognitive Status: Within Functional  Limits for tasks assessed                                     General Comments       Exercises     Shoulder Instructions      Home Living Family/patient expects to be discharged to:: Private residence Living Arrangements: Alone Available Help at Discharge: Family Type of Home: House Home Access: Stairs to enter Technical brewer of Steps: 2 Entrance Stairs-Rails: None Home Layout: One level     Bathroom Shower/Tub: Walk-in shower;Tub/shower unit   Bathroom Toilet: Handicapped height Bathroom Accessibility: Yes   Home Equipment: None;Grab bars - toilet   Additional Comments: pt's daughter can assist her as needed      Prior Functioning/Environment Level of Independence: Independent                 OT Problem List: Decreased activity tolerance;Impaired balance (sitting and/or standing);Decreased knowledge of use of DME or AE;Obesity      OT Treatment/Interventions: Self-care/ADL training;Therapeutic exercise;DME and/or AE instruction;Therapeutic activities;Balance training;Patient/family education    OT Goals(Current goals can be found in the care plan section) Acute Rehab OT Goals Patient Stated Goal: get independent again OT Goal Formulation: With patient Time For Goal Achievement: 10/22/20 Potential to Achieve Goals: Good  OT Frequency: Min 2X/week   Barriers to D/C:            Co-evaluation PT/OT/SLP Co-Evaluation/Treatment: Yes Reason for Co-Treatment: To address functional/ADL transfers;For patient/therapist safety PT goals addressed during session: Mobility/safety with mobility OT goals addressed during session: ADL's and self-care      AM-PAC OT "6 Clicks" Daily Activity     Outcome Measure Help from another person eating meals?: None Help from another person taking care of personal grooming?: A Little Help from another person toileting, which includes using toliet, bedpan, or urinal?: A Little Help from another person  bathing (including washing, rinsing, drying)?: A Little Help from another person to put on and taking off regular upper body clothing?: A Little Help from another person to put on and taking off regular lower body clothing?: A Little 6 Click Score: 19   End of Session Equipment Utilized During Treatment: Rolling walker Nurse Communication: Mobility status  Activity Tolerance: Patient tolerated treatment well Patient left: in chair;with call bell/phone within reach  OT Visit Diagnosis: Other abnormalities of gait and mobility (R26.89);Pain                Time: 6213-0865 OT Time Calculation (min): 27 min Charges:  OT General Charges $OT Visit: 1 Visit OT Evaluation $OT Eval Low Complexity: 1 Low  Armanie Ullmer, OTR/L [place]  Office [phone] Pager: [place] 10/08/2020, 11:34 AM

## 2020-10-08 NOTE — TOC Initial Note (Signed)
Transition of Care [facility]) - Initial/Assessment Note    Patient Details  Name: Samantha Mcgrath MRN: [id number] Date of Birth: 03-27-1951  [facility]) CM/SW Contact:    Cristobal Advani, Marjie Skiff, RN Phone Number: 10/08/2020, 2:58 PM  Clinical Narrative:                   Expected Discharge Plan: [place] Barriers to Discharge: Continued Medical Work up   Patient Goals and CMS Choice Patient states their goals for this hospitalization and ongoing recovery are:: To go home CMS Medicare.gov Compare Post Acute Care list provided to:: Patient Choice offered to / list presented to : Patient  Expected Discharge Plan and Services Expected Discharge Plan: [place]   Discharge Planning Services: CM Consult Post Acute Care Choice: [place] arrangements for the past 2 months: Single Family Home                 DME Arranged: [place] Rile wide,3-N-1 (wide 3 in 1) DME Agency: [employer] Date DME Agency Contacted: 10/08/20 Time DME Agency Contacted: [phone]   [place]: PT,OT,RN [place]: [facility] [facility] [employer] Date [place]: 10/08/20 Time [place]: 1300 Representative spoke with at [place]: [place] Arrangements/Services Living arrangements for the past 2 months: [place] Lives with:: Self Patient language and need for interpreter reviewed:: Yes Do you feel safe going back to the place where you live?: Yes      Need for Family Participation in Patient Care: Yes (Comment) Care giver support system in place?: Yes (comment)   Criminal Activity/Legal Involvement Pertinent to Current Situation/Hospitalization: No - Comment as needed  Activities of Daily Living Home Assistive Devices/Equipment: Eyeglasses,CBG Meter,Blood pressure cuff,Other (Comment) (pulse oximeter) ADL Screening (condition at time of admission) Patient's cognitive ability adequate to safely complete daily  activities?: Yes Is the patient deaf or have difficulty hearing?: No Does the patient have difficulty seeing, even when wearing glasses/contacts?: No Does the patient have difficulty concentrating, remembering, or making decisions?: No Patient able to express need for assistance with ADLs?: Yes Does the patient have difficulty dressing or bathing?: No Independently performs ADLs?: Yes (appropriate for developmental age) Does the patient have difficulty walking or climbing stairs?: Yes Weakness of Legs: Right Weakness of Arms/Hands: None  Permission Sought/Granted                  Emotional Assessment Appearance:: Appears stated age Attitude/Demeanor/Rapport: Gracious Affect (typically observed): Calm Orientation: : Oriented to Self,Oriented to Place,Oriented to  Time,Oriented to Situation Alcohol / Substance Use: Not Applicable Psych Involvement: No (comment)  Admission diagnosis:  Osteomyelitis of ankle or foot, acute, right ([place]) [M86.171] Osteomyelitis of right foot, unspecified type [facility]) [M86.9] Patient Active Problem List   Diagnosis Date Noted  . Osteomyelitis due to type 2 diabetes mellitus ([place]) 10/04/2020  . Osteomyelitis of ankle or foot, acute, right ([place]) 10/04/2020  . Chronic renal failure, stage 3a ([place]) 10/08/2019  . Anxiety 07/08/2019  . Combined forms of age-related cataract of right eye 08/15/2018  . Gastroesophageal reflux disease without esophagitis 01/29/2018  . Asthma 01/28/2018  . Hyperlipidemia 01/28/2018  . Type 2 diabetes mellitus without complication, with long-term current use of insulin ([place]) 01/28/2018  . Retinal detachment of left eye with multiple breaks 11/14/2016  . Morbid obesity ([place]) 08/17/2015  . Essential hypertension 06/01/2014   PCP:  Sandrea Hughs, NP Pharmacy:  [id number] [address], [place] [address] Phone: [phone] Fax: [phone]     Social  Determinants of Health (SDOH) Interventions    Readmission Risk Interventions No flowsheet data found.

## 2020-10-09 LAB — GLUCOSE, CAPILLARY
Glucose-Capillary: 148 mg/dL — ABNORMAL HIGH (ref 70–99)
Glucose-Capillary: 128 mg/dL — ABNORMAL HIGH (ref 70–99)
Glucose-Capillary: 139 mg/dL — ABNORMAL HIGH (ref 70–99)

## 2020-10-09 LAB — CULTURE, BLOOD (ROUTINE X 2)
Culture: NO GROWTH
Special Requests: ADEQUATE

## 2020-10-09 MED ORDER — DICLOFENAC SODIUM 1 % EX GEL: 2.0000 g | Freq: Four times a day (QID) | CUTANEOUS | 0 refills | Status: AC

## 2020-10-09 MED ORDER — ALLOPURINOL 100 MG PO TABS: 100.0000 mg | ORAL_TABLET | Freq: Every day | ORAL | 0 refills | Status: DC

## 2020-10-09 MED ORDER — AMOXICILLIN-POT CLAVULANATE 875-125 MG PO TABS: 1.0000 | ORAL_TABLET | Freq: Two times a day (BID) | ORAL | 0 refills | Status: AC

## 2020-10-09 MED ORDER — OXYCODONE HCL 5 MG PO TABS: 5.0000 mg | ORAL_TABLET | Freq: Four times a day (QID) | ORAL | 0 refills | Status: DC | PRN

## 2020-10-09 MED ORDER — ACETAMINOPHEN 325 MG PO TABS: 650.0000 mg | ORAL_TABLET | Freq: Four times a day (QID) | ORAL | Status: DC | PRN

## 2020-10-09 NOTE — Progress Notes (Signed)
Subjective: Samantha Mcgrath is a 70 y.o. female patient seen at bedside, resting comfortably in no acute distress s/p day #3 Right 2nd toe amputation secondary to osteomyelitis with clinical gouty tophi identified during surgery. Patient denies pain at surgical site, reports that she is concerned about her pain on the left leg and knee, reports it hurts bad and she can not walk. No other issues noted.   Patient Active Problem List   Diagnosis Date Noted  . Osteomyelitis due to type 2 diabetes mellitus ([place]) 10/04/2020  . Osteomyelitis of ankle or foot, acute, right ([place]) 10/04/2020  . Chronic renal failure, stage 3a ([place]) 10/08/2019  . Anxiety 07/08/2019  . Combined forms of age-related cataract of right eye 08/15/2018  . Gastroesophageal reflux disease without esophagitis 01/29/2018  . Asthma 01/28/2018  . Hyperlipidemia 01/28/2018  . Type 2 diabetes mellitus without complication, with long-term current use of insulin ([place]) 01/28/2018  . Retinal detachment of left eye with multiple breaks 11/14/2016  . Morbid obesity ([place]) 08/17/2015  . Essential hypertension 06/01/2014     Current Facility-Administered Medications:  .  acetaminophen (TYLENOL) tablet 650 mg, 650 mg, Oral, QID, 650 mg at 10/07/20 2219 **OR** [DISCONTINUED] acetaminophen (TYLENOL) suppository 650 mg, 650 mg, Rectal, Q6H PRN, Para Skeans, MD .  allopurinol (ZYLOPRIM) tablet 100 mg, 100 mg, Oral, Daily, Arrien, Jimmy Picket, MD, 100 mg at 10/08/20 0936 .  amoxicillin-clavulanate (AUGMENTIN) 875-125 MG per tablet 1 tablet, 1 tablet, Oral, Q12H, Arrien, Jimmy Picket, MD, 1 tablet at 10/08/20 2056 .  atorvastatin (LIPITOR) tablet 10 mg, 10 mg, Oral, Daily, Florina Ou V, MD, 10 mg at 10/08/20 0936 .  colchicine tablet 0.6 mg, 0.6 mg, Oral, BID, Arrien, Jimmy Picket, MD, 0.6 mg at 10/08/20 2056 .  cyclobenzaprine (FLEXERIL) tablet 5 mg, 5 mg, Oral, TID, Arrien, Jimmy Picket, MD, 5 mg at 10/08/20 2056 .  diclofenac  Sodium (VOLTAREN) 1 % topical gel 2 g, 2 g, Topical, QID, Arrien, Jimmy Picket, MD, 2 g at 10/08/20 2058 .  DULoxetine (CYMBALTA) DR capsule 20 mg, 20 mg, Oral, QHS, Arrien, Jimmy Picket, MD, 20 mg at 10/08/20 2055 .  enoxaparin (LOVENOX) injection 40 mg, 40 mg, Subcutaneous, Q24H, Arrien, Jimmy Picket, MD, 40 mg at 10/08/20 1746 .  HYDROmorphone (DILAUDID) injection 0.5 mg, 0.5 mg, Intravenous, Q4H PRN, Arrien, Jimmy Picket, MD, 0.5 mg at 10/08/20 0629 .  insulin aspart (novoLOG) injection 0-15 Units, 0-15 Units, Subcutaneous, TID WC, Para Skeans, MD, 2 Units at 10/09/20 [phone] .  insulin aspart (novoLOG) injection 0-5 Units, 0-5 Units, Subcutaneous, QHS, Patel, Ekta V, MD .  losartan (COZAAR) tablet 25 mg, 25 mg, Oral, Daily, Dwyane Dee, MD, 25 mg at 10/08/20 0936 .  montelukast (SINGULAIR) tablet 10 mg, 10 mg, Oral, QHS, Para Skeans, MD, 10 mg at 10/08/20 2056 .  Muscle Rub CREA 1 application, 1 application, Topical, PRN, Arrien, Jimmy Picket, MD, 1 application at 70/62/37 0036 .  ondansetron (ZOFRAN) tablet 4 mg, 4 mg, Oral, Q6H PRN **OR** ondansetron (ZOFRAN) injection 4 mg, 4 mg, Intravenous, Q6H PRN, Para Skeans, MD, 4 mg at 10/05/20 2100 .  oxyCODONE (Oxy IR/ROXICODONE) immediate release tablet 5 mg, 5 mg, Oral, Q4H PRN, Arrien, Jimmy Picket, MD, 5 mg at 10/08/20 1842 .  oxyCODONE (OXYCONTIN) 12 hr tablet 10 mg, 10 mg, Oral, Q12H, Arrien, Jimmy Picket, MD, 10 mg at 10/08/20 2056 .  pantoprazole (PROTONIX) EC tablet 40 mg, 40 mg, Oral, BID, Para Skeans, MD,  40 mg at 10/08/20 2056  Allergies  Allergen Reactions  . Procaine Other (See Comments) and Palpitations    ALSO BLISTERS WITH TOPICAL Novacaine   Novocain. ALSO BLISTERS WITH TOPICAL     Objective: Today's Vitals   10/08/20 1842 10/08/20 2056 10/08/20 2230 10/09/20 0516  BP:   (!) 133/56 (!) 131/48  Pulse:   74 70  Resp:   18 15  Temp:   98.5 F (36.9 C) 98.8 F (37.1 C)  TempSrc:   Oral Oral   SpO2:   95% 95%  Weight:      Height:      PainSc: 6  4       General: No acute distress  Right Lower extremity: Dressing to Right foot clean, dry, intact. No strikethrough noted, Upon removal of dressings sutures intact with no dehiscence. Decreased erythema, decreased edema, no active drainage. No other acute signs of infection. No calf pain. Range of motion excluding surgical site within normal limits with no pain or crepitation.  S/p 2nd toe amputation.   Assessment and Plan:  Problem List Items Addressed This Visit      Endocrine   * (Principal) Osteomyelitis due to type 2 diabetes mellitus (HCC)   Relevant Medications   insulin aspart (novoLOG) injection 0-15 Units   insulin aspart (novoLOG) injection 0-5 Units   atorvastatin (LIPITOR) tablet 10 mg   losartan (COZAAR) tablet 25 mg    Other Visit Diagnoses    Osteomyelitis of right foot, unspecified type ([place])    -  Primary   S/P amputation of lesser toe, right (HCC)       Relevant Orders   DG Foot 2 Views Right (Completed)   Pain       Relevant Orders   DG Lumbar Spine 2-3 Views (Completed)   DG Knee Complete 4 Views Left (Completed)      -Patient seen and evaluated at bedside -Chart reviewed -Dressing change preformed; applied dry dressing to right foot -Advised patient to make sure to keep dressing clean, dry, and intact to Right foot allowing nursing to change dressings as ordered if there is strike through  -Patient ok to go home on Augmentin -Weightbearing to heel with post op shoe for bedside and bathroom only with assistive device; PT recs reviewed  -Continue with rest and elevation to assist with pain and edema control  -Continue medical management for gout -Patient is stable from podiatry point of view to go home. Patient will follow up in office for dressing changes weekly; patient to keep dressings clean dry and intact and does not need any home nursing for wound care at this time. Office will contact  patient for follow up appointment after discharge.   Dr. Landis Martins, DPM [facility] [phone] office [phone] cell Available via secure chat

## 2020-10-09 NOTE — Discharge Summary (Signed)
Physician Discharge Summary  Samantha Mcgrath [id number] DOB: 07-19-1950 DOA: 10/04/2020  PCP: Samantha Hughs, NP  Admit date: 10/04/2020 Discharge date: 10/09/2020  Admitted From: Home  Disposition:  Home   Recommendations for Outpatient Follow-up and new medication changes:  1. Follow up with Samantha Ngetich NP in 7 to 10 days.  2. Continue antibiotic therapy with Augmentin  3. Pain control with oxycodone IR and acetaminophen 4. Added stool softener.  5. Started on allopurinol, follow uric acid in 10 days.  6. Follow-up as an outpatient with podiatry for weekly dressing changes.  Home Health:  yes Equipment/Devices: na    Discharge Condition: stable  CODE STATUS: full  Diet recommendation:  Heart healthy and heart healthy   Brief/Interim Summary: Samantha Mcgrath was admitted to the hospital with the working diagnosis of right foot second toe osteomyelitis, infected gouty tophi.  70 year old female with a past medical history of type II diabetes mellitus, hypertension, dyslipidemia and obesity class II,who presents with worsening edema and erythema at the her right foot second toe. As an outpatient she failed antibiotic therapy with doxycycline. She had 3 days of fevers and worsening back pain. On her initial physical examination blood pressure 165/75, heart rate 78, respiratory rate 20, oxygen saturation 94%,lungs were clear to auscultation bilaterally, heart S1-S2, present, rhythmic, soft abdomen, right foot second toe with edema, erythema, discoloration and purulence.  Sodium 134, potassium 3.0, chloride 93, bicarb 27, glucose 94, BUN 34, creatinine 1.20, white count 14.1, hemoglobin 12.6, hematocrit 40.1, platelets 374. SARS COVID-19 negative.  Patient was placed on broad-spectrum IV antibiotic therapy, podiatry was consulted.  Underwent 05/04 right foot second toe amputation, found osteomyelitis with significant gouty tophi.   Continue to have back pain, moderate to  severe in intensity, has difficulty getting out of the bed.  Further work up with lumbar spine and left knee radiographs showing degenerative joint disease.  Back pain slowly improving.   1.  Right foot second toe osteomyelitis, infected gouty tophi.  Patient was admitted to the medical ward, she received broad-spectrum antibiotic therapy with intravenous ceftriaxone, she underwent amputation of her second toe with good toleration. Her blood cultures remain no growth, her surgical cultures were positive for Streptococcus agalactiae.  She was transitioned to oral Augmentin with good toleration. Continue antibiotic therapy for 10 more days.  Follow with podiatry as outpatient.   2.  Gouty arthritis.  No frank acute flare, patient received allopurinol and colchicine with good toleration. Her uric acid level was 6.2.  3.  Acute on chronic back pain, left knee pain due to osteoarthritis.  Obesity class II.  Further work-up with radiographs of her lumbar spine and left knee showed osteoarthritis. Patient received pain control with oral analgesics along with topical diclofenac. She continued to have left knee pain, no signs of local wound gout type inflammation.  Patient continue outpatient physical therapy. Calculated BMI 36.6.  4.  Type 2 diabetes mellitus, dyslipidemia.  Her glucose remained stable, patient received insulin sliding scale during her hospitalization. At discharge continue with metformin and glipizide.  Continue statin therapy.  5.  Hypertension.  Continue blood pressure control with losartan.  6.  Acute kidney injury on chronic kidney disease stage II, hypokalemia.  With supportive medical therapy her kidney function improved, she received potassium chloride.  At discharge serum sodium 134, potassium 3.5, chloride 94, bicarb 26, glucose 120, BUN 22, creatinine 0.95.  7.  GERD.  Continue pantoprazole.    Discharge Diagnoses:  Principal Problem:  Osteomyelitis due to  type 2 diabetes mellitus (HCC) Active Problems:   Asthma   Essential hypertension   Gastroesophageal reflux disease without esophagitis   Chronic renal failure, stage 3a (HCC)   Type 2 diabetes mellitus without complication, with long-term current use of insulin (HCC)   Osteomyelitis of ankle or foot, acute, right [facility])    Discharge Instructions   Allergies as of 10/09/2020      Reactions   Procaine Other (See Comments), Palpitations   ALSO BLISTERS WITH TOPICAL Novacaine  Novocain. ALSO BLISTERS WITH TOPICAL      Medication List    STOP taking these medications   doxycycline 100 MG tablet Commonly known as: VIBRA-TABS   traMADol 50 MG tablet Commonly known as: ULTRAM     TAKE these medications   acetaminophen 325 MG tablet Commonly known as: TYLENOL Take 2 tablets (650 mg total) by mouth every 6 (six) hours as needed for moderate pain.   allopurinol 100 MG tablet Commonly known as: ZYLOPRIM Take 1 tablet (100 mg total) by mouth daily. Start taking on: Oct 10, 2020   amoxicillin-clavulanate 875-125 MG tablet Commonly known as: AUGMENTIN Take 1 tablet by mouth every 12 (twelve) hours for 10 days.   atorvastatin 10 MG tablet Commonly known as: LIPITOR Take 10 mg by mouth daily.   chlorthalidone 50 MG tablet Commonly known as: HYGROTON Take 50 mg by mouth daily.   diclofenac Sodium 1 % Gel Commonly known as: VOLTAREN Apply 2 g topically 4 (four) times daily. Apply to left knee   gabapentin 100 MG capsule Commonly known as: NEURONTIN Take 1 capsule (100 mg total) by mouth 2 (two) times daily.   glipiZIDE 5 MG 24 hr tablet Commonly known as: GLUCOTROL XL Take 5 mg by mouth at bedtime.   losartan 25 MG tablet Commonly known as: COZAAR TAKE 1 TABLET (25 MG TOTAL) BY MOUTH DAILY.   metFORMIN 500 MG 24 hr tablet Commonly known as: GLUCOPHAGE-XR Take 1,000 mg by mouth 2 (two) times daily.   montelukast 10 MG tablet Commonly known as: SINGULAIR Take 10 mg  by mouth at bedtime.   oxyCODONE 5 MG immediate release tablet Commonly known as: Oxy IR/ROXICODONE Take 1 tablet (5 mg total) by mouth every 6 (six) hours as needed for severe pain.   pantoprazole 40 MG tablet Commonly known as: PROTONIX Take 40 mg by mouth 2 (two) times daily.   PROBIOTIC ACIDOPHILUS PO Take 1 tablet by mouth daily.   tiZANidine 4 MG tablet Commonly known as: ZANAFLEX Take 1 tablet (4 mg total) by mouth daily as needed. What changed: reasons to take this   Vitamin D3 250 MCG (10000 UT) capsule Take 10,000 Units by mouth daily.            Durable Medical Equipment  (From admission, onward)         Start     Ordered   10/08/20 1301  For home use only DME 3 n 1  Once       Comments: WIDE   10/08/20 1300   10/08/20 1301  For home use only DME Walker rolling  Once       Comments: WIDE  Question Answer Comment  Walker: With 5 Inch Wheels   Patient needs a walker to treat with the following condition Weakness      10/08/20 1300          Follow-up Information    [facility] [place] Follow up.   Specialty:  Home Health Services Contact information: [address] [place] [address] [phone]              Allergies  Allergen Reactions  . Procaine Other (See Comments) and Palpitations    ALSO BLISTERS WITH TOPICAL Novacaine   Novocain. ALSO BLISTERS WITH TOPICAL    Consultations:  Podiatry    Procedures/Studies: DG Lumbar Spine 2-3 Views  Result Date: 10/07/2020 CLINICAL DATA:  Low back pain EXAM: LUMBAR SPINE - 3 VIEW COMPARISON:  None. FINDINGS: Five lumbar type vertebral bodies are well visualized. Vertebral body height is well maintained. No anterolisthesis is noted. Multilevel osteophytic changes and disc space narrowing are seen. Facet hypertrophic changes are noted as well. No soft tissue abnormality is seen. IMPRESSION: Multilevel degenerative change without acute abnormality. Electronically Signed   By: Inez Catalina M.D.   On: 10/07/2020 16:33   DG Knee Complete 4 Views Left  Result Date: 10/07/2020 CLINICAL DATA:  Left knee pain EXAM: LEFT KNEE - COMPLETE 4+ VIEW COMPARISON:  None. FINDINGS: Tricompartmental degenerative changes are noted. No soft tissue abnormality is seen. No acute fracture or dislocation is seen. IMPRESSION: Degenerative change without acute abnormality. Electronically Signed   By: Inez Catalina M.D.   On: 10/07/2020 16:36   DG Foot 2 Views Left  Result Date: 09/30/2020 Please see detailed radiograph report in office note.  DG Foot 2 Views Right  Result Date: 10/06/2020 CLINICAL DATA:  Postop amputation second toe EXAM: RIGHT FOOT - 2 VIEW COMPARISON:  09/30/2020 FINDINGS: Interval amputation of the second digit at the level of the MTP joint. No fracture or malalignment. Mild degenerative change at the first MTP joint. Plantar calcaneal spur. IMPRESSION: Status post amputation of second digit at the level of MTP joint Electronically Signed   By: Donavan Foil M.D.   On: 10/06/2020 22:04   DG Foot 2 Views Right  Result Date: 10/05/2020 Please see detailed radiograph report in office note.  DG Foot 2 Views Right  Result Date: 09/30/2020 Please see detailed radiograph report in office note.  VAS [place] ABI WITH/WO TBI  Result Date: 10/06/2020  LOWER EXTREMITY DOPPLER STUDY Patient Name:  CLEMMIE DEJONG  Date of Exam:   10/06/2020 Medical Rec #: [id number]        Accession #:    [id number] Date of Birth: Mar 20, 1951        Patient Gender: F Patient Age:   70Y Exam Location:  [facility] Procedure:      VAS [place] ABI WITH/WO TBI Referring Phys: [id number] TITORYA STOVER --------------------------------------------------------------------------------  Indications: Ulceration. High Risk Factors: Hypertension, hyperlipidemia, Diabetes.  Limitations: Today's exam was limited due to Central line placed in left upper              arm. Comparison Study: No prior studies. Performing Technologist:  Carlos Levering RVT  Examination Guidelines: A complete evaluation includes at minimum, Doppler waveform signals and systolic blood pressure reading at the level of bilateral brachial, anterior tibial, and posterior tibial arteries, when vessel segments are accessible. Bilateral testing is considered an integral part of a complete examination. Photoelectric Plethysmograph (PPG) waveforms and toe systolic pressure readings are included as required and additional duplex testing as needed. Limited examinations for reoccurring indications may be performed as noted.  ABI Findings: +---------+------------------+-----+---------+--------+ Right    Rt Pressure (mmHg)IndexWaveform Comment  +---------+------------------+-----+---------+--------+ Brachial 131  triphasic         +---------+------------------+-----+---------+--------+ PTA      123               0.94 triphasic         +---------+------------------+-----+---------+--------+ DP       128               0.98 triphasic         +---------+------------------+-----+---------+--------+ Great Toe75                0.57                   +---------+------------------+-----+---------+--------+ +---------+------------------+-----+---------+------------+ Left     Lt Pressure (mmHg)IndexWaveform Comment      +---------+------------------+-----+---------+------------+ Brachial                                 Central line +---------+------------------+-----+---------+------------+ PTA      125               0.95 triphasic             +---------+------------------+-----+---------+------------+ DP       150               1.15 triphasic             +---------+------------------+-----+---------+------------+ Great Toe56                0.43                       +---------+------------------+-----+---------+------------+ +-------+-----------+-----------+------------+------------+ ABI/TBIToday's ABIToday's  TBIPrevious ABIPrevious TBI +-------+-----------+-----------+------------+------------+ Right  0.98       0.57                                +-------+-----------+-----------+------------+------------+ Left   1.15       0.43                                +-------+-----------+-----------+------------+------------+  Summary: Right: Resting right ankle-brachial index is within normal range. No evidence of significant right lower extremity arterial disease. The right toe-brachial index is abnormal. Left: Resting left ankle-brachial index is within normal range. No evidence of significant left lower extremity arterial disease. The left toe-brachial index is abnormal.  *See table(s) above for measurements and observations.  Electronically signed by Jamelle Haring on 10/06/2020 at 5:06:31 PM.    Final       Procedures: right foot 2nd toe amputation   Subjective: Patient is feeling better, back pain is controlled, continue to have left knee pain, no nausea or vomiting.   Discharge Exam: Vitals:   10/08/20 2230 10/09/20 0516  BP: (!) 133/56 (!) 131/48  Pulse: 74 70  Resp: 18 15  Temp: 98.5 F (36.9 C) 98.8 F (37.1 C)  SpO2: 95% 95%   Vitals:   10/08/20 0511 10/08/20 1612 10/08/20 2230 10/09/20 0516  BP: 129/68 (!) 137/53 (!) 133/56 (!) 131/48  Pulse: 73 64 74 70  Resp: 18 16 18 15   Temp: 98 F (36.7 C) 98.2 F (36.8 C) 98.5 F (36.9 C) 98.8 F (37.1 C)  TempSrc: Oral Oral Oral Oral  SpO2: 95% 96% 95% 95%  Weight:      Height:        General: Not in pain or  dyspnea,  Neurology: Awake and alert, non focal  E ENT: no pallor, no icterus, oral mucosa moist Cardiovascular: No JVD. S1-S2 present, rhythmic, no gallops, rubs, or murmurs. No lower extremity edema. Pulmonary: vesicular breath sounds bilaterally, adequate air movement, no wheezing, rhonchi or rales. Gastrointestinal. Abdomen soft and non tender Skin. No rashes Musculoskeletal: right foot 2nd toe amputation    The results of significant diagnostics from this hospitalization (including imaging, microbiology, ancillary and laboratory) are listed below for reference.     Microbiology: Recent Results (from the past 240 hour(s))  WOUND CULTURE     Status: Abnormal   Collection Time: 09/30/20  2:27 PM  Result Value Ref Range Status   MICRO NUMBER: [id number]  Final   SPECIMEN QUALITY: Adequate  Final   SOURCE: WOUND (SITE NOT SPECIFIED)  Final   STATUS: FINAL  Final   GRAM STAIN:   Final    Few epithelial cells No white blood cells seen Few Gram positive cocci in pairs   ISOLATE 1: Streptococcus agalactiae (A)  Final    Comment: Heavy growth of Group B Streptococcus isolated Beta-hemolytic streptococci are predictably susceptible to Penicillin and other beta-lactams. Susceptibility testing not routinely performed. Please contact the laboratory within 3 days if susceptibility  testing is desired.   Culture, blood (routine x 2)     Status: None (Preliminary result)   Collection Time: 10/04/20  7:09 PM   Specimen: BLOOD  Result Value Ref Range Status   Specimen Description   Final    BLOOD LEFT ANTECUBITAL Performed at [facility] [address], [place], [place] [address]    Special Requests   Final    BOTTLES DRAWN AEROBIC AND ANAEROBIC Blood Culture adequate volume Performed at [facility] [address] [place], [place] [address]    Culture   Final    NO GROWTH 4 DAYS Performed at [facility], [place] [address] [place], [place] [address]    Report Status PENDING  Incomplete  Resp Panel by RT-PCR (Flu A&B, Covid) Nasopharyngeal Swab     Status: None   Collection Time: 10/04/20  7:09 PM   Specimen: Nasopharyngeal Swab; Nasopharyngeal(NP) swabs in vial transport medium  Result Value Ref Range Status   SARS Coronavirus 2 by RT PCR NEGATIVE NEGATIVE Final    Comment: (NOTE) SARS-CoV-2 target nucleic acids are NOT DETECTED.  The SARS-CoV-2  RNA is generally detectable in upper respiratory specimens during the acute phase of infection. The lowest concentration of SARS-CoV-2 viral copies this assay can detect is 138 copies/mL. A negative result does not preclude SARS-Cov-2 infection and should not be used as the sole basis for treatment or other patient management decisions. A negative result may occur with  improper specimen collection/handling, submission of specimen other than nasopharyngeal swab, presence of viral mutation(s) within the areas targeted by this assay, and inadequate number of viral copies(<138 copies/mL). A negative result must be combined with clinical observations, patient history, and epidemiological information. The expected result is Negative.  Fact Sheet for Patients:  EntrepreneurPulse.com.au  Fact Sheet for Healthcare Providers:  IncredibleEmployment.be  This test is no t yet approved or cleared by the [place] FDA and  has been authorized for detection and/or diagnosis of SARS-CoV-2 by FDA under an Emergency Use Authorization (EUA). This EUA will remain  in effect (meaning this test can be used) for the duration of the COVID-19 declaration under Section 564(b)(1) of the Act, 21 U.S.C.section 360bbb-3(b)(1), unless the  authorization is terminated  or revoked sooner.       Influenza A by PCR NEGATIVE NEGATIVE Final   Influenza B by PCR NEGATIVE NEGATIVE Final    Comment: (NOTE) The Xpert Xpress SARS-CoV-2/FLU/RSV plus assay is intended as an aid in the diagnosis of influenza from Nasopharyngeal swab specimens and should not be used as a sole basis for treatment. Nasal washings and aspirates are unacceptable for Xpert Xpress SARS-CoV-2/FLU/RSV testing.  Fact Sheet for Patients: EntrepreneurPulse.com.au  Fact Sheet for Healthcare Providers: IncredibleEmployment.be  This test is not yet approved or cleared by the  [place] FDA and has been authorized for detection and/or diagnosis of SARS-CoV-2 by FDA under an Emergency Use Authorization (EUA). This EUA will remain in effect (meaning this test can be used) for the duration of the COVID-19 declaration under Section 564(b)(1) of the Act, 21 U.S.C. section 360bbb-3(b)(1), unless the authorization is terminated or revoked.  Performed at [facility], [place] [address] [place], [place] [address]   Culture, blood (routine x 2)     Status: None (Preliminary result)   Collection Time: 10/05/20  2:59 AM   Specimen: BLOOD  Result Value Ref Range Status   Specimen Description   Final    BLOOD BLOOD RIGHT FOREARM Performed at [facility] [address] [place], [place] [address]    Special Requests   Final    BOTTLES DRAWN AEROBIC AND ANAEROBIC Blood Culture adequate volume Performed at [facility] [address] [place], [place] [address]    Culture   Final    NO GROWTH 3 DAYS Performed at [facility], [place] [address], [place], [place] [address]    Report Status PENDING  Incomplete  Aerobic/Anaerobic Culture w Gram Stain (surgical/deep wound)     Status: None (Preliminary result)   Collection Time: 10/06/20  6:36 PM   Specimen: Soft Tissue, Other  Result Value Ref Range Status   Specimen Description   Final    TOE RIGHT 2ND STUMP WOUND Performed at [facility] [address], [place], [place] [address]    Special Requests   Final    NONE Performed at [facility], [place] [address] [place], [place] [address]    Gram Stain   Final    NO WBC SEEN NO ORGANISMS SEEN Performed at [facility], [place] [address], [place], [place] [address]    Culture   Final    RARE STREPTOCOCCUS AGALACTIAE TESTING AGAINST S. AGALACTIAE NOT ROUTINELY PERFORMED DUE TO PREDICTABILITY OF AMP/PEN/VAN SUSCEPTIBILITY. NO ANAEROBES ISOLATED; CULTURE IN  PROGRESS FOR 5 DAYS    Report Status PENDING  Incomplete     Labs: BNP (last 3 results) No results for input(s): BNP in the last 8760 hours. Basic Metabolic Panel: Recent Labs  Lab 10/04/20 1909 10/05/20 0302 10/06/20 0528 10/07/20 1302 10/08/20 0636  NA 134* 133* 135 135 134*  K 3.0* 2.9* 3.2* 3.8 3.5  CL 93* 94* 97* 97* 94*  CO2 27 26 29 29 26   GLUCOSE 94 90 122* 147* 120*  BUN 34* 31* 33* 20 22  CREATININE 1.20* 1.07* 1.21* 0.97 0.95  CALCIUM 9.7 9.1 8.9 9.2 8.9  MG  --  1.3* 1.6*  --   --   PHOS  --  3.3  --   --   --    Liver Function Tests: Recent Labs  Lab 10/04/20 1909 10/05/20 0302  AST 20 17  ALT 16 14  ALKPHOS 85 66  BILITOT 0.6 0.9  PROT 8.4* 6.9  ALBUMIN 3.9 3.2*   No results for input(s): LIPASE, AMYLASE in the last 168 hours. No results for input(s): AMMONIA in the last 168 hours. CBC: Recent Labs  Lab 10/04/20 1909 10/05/20 0302 10/06/20 0528 10/07/20 1300 10/08/20 0636  WBC 14.1* 11.0* 9.7 9.5 9.8  NEUTROABS 10.2*  --  5.5 6.1 6.2  HGB 12.6 10.8* 10.3* 10.5* 10.1*  HCT 40.1 33.4* 32.0* 33.6* 32.3*  MCV 83.7 83.3 83.6 83.6 85.2  PLT 374 315 323 345 354   Cardiac Enzymes: No results for input(s): CKTOTAL, CKMB, CKMBINDEX, TROPONINI in the last 168 hours. BNP: Invalid input(s): POCBNP CBG: Recent Labs  Lab 10/08/20 1143 10/08/20 1612 10/08/20 2228 10/09/20 0725 10/09/20 1123  GLUCAP 133* 121* 148* 128* 139*   D-Dimer No results for input(s): DDIMER in the last 72 hours. Hgb A1c No results for input(s): HGBA1C in the last 72 hours. Lipid Profile No results for input(s): CHOL, HDL, LDLCALC, TRIG, CHOLHDL, LDLDIRECT in the last 72 hours. Thyroid function studies No results for input(s): TSH, T4TOTAL, T3FREE, THYROIDAB in the last 72 hours.  Invalid input(s): FREET3 Anemia work up No results for input(s): VITAMINB12, FOLATE, FERRITIN, TIBC, IRON, RETICCTPCT in the last 72 hours. Urinalysis No results found for: COLORURINE,  APPEARANCEUR, [place], [place], [place], [place], [place], [place], PROTEINUR, UROBILINOGEN, NITRITE, LEUKOCYTESUR Sepsis Labs Invalid input(s): PROCALCITONIN,  WBC,  LACTICIDVEN Microbiology Recent Results (from the past 240 hour(s))  WOUND CULTURE     Status: Abnormal   Collection Time: 09/30/20  2:27 PM  Result Value Ref Range Status   MICRO NUMBER: [id number]  Final   SPECIMEN QUALITY: Adequate  Final   SOURCE: WOUND (SITE NOT SPECIFIED)  Final   STATUS: FINAL  Final   GRAM STAIN:   Final    Few epithelial cells No white blood cells seen Few Gram positive cocci in pairs   ISOLATE 1: Streptococcus agalactiae (A)  Final    Comment: Heavy growth of Group B Streptococcus isolated Beta-hemolytic streptococci are predictably susceptible to Penicillin and other beta-lactams. Susceptibility testing not routinely performed. Please contact the laboratory within 3 days if susceptibility  testing is desired.   Culture, blood (routine x 2)     Status: None (Preliminary result)   Collection Time: 10/04/20  7:09 PM   Specimen: BLOOD  Result Value Ref Range Status   Specimen Description   Final    BLOOD LEFT ANTECUBITAL Performed at [facility] [address] [place], [place] [address]    Special Requests   Final    BOTTLES DRAWN AEROBIC AND ANAEROBIC Blood Culture adequate volume Performed at [facility] [address] [place], [place] [address]    Culture   Final    NO GROWTH 4 DAYS Performed at [facility], [place] [address] [place], [place] [address]    Report Status PENDING  Incomplete  Resp Panel by RT-PCR (Flu A&B, Covid) Nasopharyngeal Swab     Status: None   Collection Time: 10/04/20  7:09 PM   Specimen: Nasopharyngeal Swab; Nasopharyngeal(NP) swabs in vial transport medium  Result Value Ref Range Status   SARS Coronavirus 2 by RT PCR NEGATIVE NEGATIVE Final    Comment: (NOTE) SARS-CoV-2 target nucleic acids are NOT  DETECTED.  The SARS-CoV-2 RNA is generally detectable in upper respiratory specimens during the acute phase of infection. The lowest concentration of SARS-CoV-2 viral copies this  assay can detect is 138 copies/mL. A negative result does not preclude SARS-Cov-2 infection and should not be used as the sole basis for treatment or other patient management decisions. A negative result may occur with  improper specimen collection/handling, submission of specimen other than nasopharyngeal swab, presence of viral mutation(s) within the areas targeted by this assay, and inadequate number of viral copies(<138 copies/mL). A negative result must be combined with clinical observations, patient history, and epidemiological information. The expected result is Negative.  Fact Sheet for Patients:  EntrepreneurPulse.com.au  Fact Sheet for Healthcare Providers:  IncredibleEmployment.be  This test is no t yet approved or cleared by the [place] FDA and  has been authorized for detection and/or diagnosis of SARS-CoV-2 by FDA under an Emergency Use Authorization (EUA). This EUA will remain  in effect (meaning this test can be used) for the duration of the COVID-19 declaration under Section 564(b)(1) of the Act, 21 U.S.C.section 360bbb-3(b)(1), unless the authorization is terminated  or revoked sooner.       Influenza A by PCR NEGATIVE NEGATIVE Final   Influenza B by PCR NEGATIVE NEGATIVE Final    Comment: (NOTE) The Xpert Xpress SARS-CoV-2/FLU/RSV plus assay is intended as an aid in the diagnosis of influenza from Nasopharyngeal swab specimens and should not be used as a sole basis for treatment. Nasal washings and aspirates are unacceptable for Xpert Xpress SARS-CoV-2/FLU/RSV testing.  Fact Sheet for Patients: EntrepreneurPulse.com.au  Fact Sheet for Healthcare Providers: IncredibleEmployment.be  This test is not yet  approved or cleared by the [place] FDA and has been authorized for detection and/or diagnosis of SARS-CoV-2 by FDA under an Emergency Use Authorization (EUA). This EUA will remain in effect (meaning this test can be used) for the duration of the COVID-19 declaration under Section 564(b)(1) of the Act, 21 U.S.C. section 360bbb-3(b)(1), unless the authorization is terminated or revoked.  Performed at [facility], [place] [address] [place], [place] [address]   Culture, blood (routine x 2)     Status: None (Preliminary result)   Collection Time: 10/05/20  2:59 AM   Specimen: BLOOD  Result Value Ref Range Status   Specimen Description   Final    BLOOD BLOOD RIGHT FOREARM Performed at [facility] [address], [place], [place] [address]    Special Requests   Final    BOTTLES DRAWN AEROBIC AND ANAEROBIC Blood Culture adequate volume Performed at [facility] [address] [place], [place] [address]    Culture   Final    NO GROWTH 3 DAYS Performed at [facility], [place] [address], [place], [place] [address]    Report Status PENDING  Incomplete  Aerobic/Anaerobic Culture w Gram Stain (surgical/deep wound)     Status: None (Preliminary result)   Collection Time: 10/06/20  6:36 PM   Specimen: Soft Tissue, Other  Result Value Ref Range Status   Specimen Description   Final    TOE RIGHT 2ND STUMP WOUND Performed at [facility] [address] [place], [place] [address]    Special Requests   Final    NONE Performed at [facility], [place] [address], [place], [place] [address]    Gram Stain   Final    NO WBC SEEN NO ORGANISMS SEEN Performed at [facility], [place] [address], [place], [place] [address]    Culture   Final    RARE STREPTOCOCCUS AGALACTIAE TESTING AGAINST S. AGALACTIAE  NOT ROUTINELY PERFORMED DUE TO PREDICTABILITY OF AMP/PEN/VAN SUSCEPTIBILITY. NO ANAEROBES  ISOLATED; CULTURE IN PROGRESS FOR 5 DAYS    Report Status PENDING  Incomplete     Time coordinating discharge: 45 minutes  SIGNED:   Tawni Millers, MD  [facility] 10/09/2020, 11:27 AM

## 2020-10-10 LAB — CULTURE, BLOOD (ROUTINE X 2)
Culture: NO GROWTH
Special Requests: ADEQUATE

## 2020-10-11 ENCOUNTER — Ambulatory Visit: Payer: Medicare HMO | Admitting: Nurse Practitioner

## 2020-10-11 LAB — AEROBIC/ANAEROBIC CULTURE W GRAM STAIN (SURGICAL/DEEP WOUND): Gram Stain: NONE SEEN

## 2020-10-12 ENCOUNTER — Telehealth: Payer: Self-pay | Admitting: *Deleted

## 2020-10-12 NOTE — Telephone Encounter (Signed)
I called patient to initiate a TOC Call. Patient stated that she is NOT coming back to our practice. Stated that she does not get satisfied when she comes.  Stated that she was also told that Webb Silversmith was leaving our practice also. I told her that Webb Silversmith was not leaving the practice but had a family emergency. She stated that was good but she was not coming back. I apologized to patient.   Stated that she wants to be removed from our practice. Stated that she was following another provider for her Osteomyelitis.  Requested to be removed.

## 2020-10-13 ENCOUNTER — Ambulatory Visit (INDEPENDENT_AMBULATORY_CARE_PROVIDER_SITE_OTHER): Payer: Medicare HMO

## 2020-10-13 ENCOUNTER — Other Ambulatory Visit: Payer: Self-pay

## 2020-10-13 ENCOUNTER — Ambulatory Visit (INDEPENDENT_AMBULATORY_CARE_PROVIDER_SITE_OTHER): Payer: Medicare HMO | Admitting: Podiatry

## 2020-10-13 DIAGNOSIS — L02611 Cutaneous abscess of right foot: Secondary | ICD-10-CM

## 2020-10-13 DIAGNOSIS — L03031 Cellulitis of right toe: Secondary | ICD-10-CM

## 2020-10-13 DIAGNOSIS — Z89421 Acquired absence of other right toe(s): Secondary | ICD-10-CM | POA: Diagnosis not present

## 2020-10-14 ENCOUNTER — Telehealth: Payer: Self-pay | Admitting: Sports Medicine

## 2020-10-14 ENCOUNTER — Ambulatory Visit: Payer: Medicare HMO

## 2020-10-14 ENCOUNTER — Ambulatory Visit: Payer: Medicare HMO | Admitting: Podiatry

## 2020-10-14 DIAGNOSIS — G8929 Other chronic pain: Secondary | ICD-10-CM | POA: Diagnosis not present

## 2020-10-14 DIAGNOSIS — E1169 Type 2 diabetes mellitus with other specified complication: Secondary | ICD-10-CM | POA: Diagnosis not present

## 2020-10-14 DIAGNOSIS — M869 Osteomyelitis, unspecified: Secondary | ICD-10-CM | POA: Diagnosis not present

## 2020-10-14 DIAGNOSIS — N1831 Chronic kidney disease, stage 3a: Secondary | ICD-10-CM | POA: Diagnosis not present

## 2020-10-14 DIAGNOSIS — Z4781 Encounter for orthopedic aftercare following surgical amputation: Secondary | ICD-10-CM | POA: Diagnosis not present

## 2020-10-14 DIAGNOSIS — I129 Hypertensive chronic kidney disease with stage 1 through stage 4 chronic kidney disease, or unspecified chronic kidney disease: Secondary | ICD-10-CM | POA: Diagnosis not present

## 2020-10-14 DIAGNOSIS — M549 Dorsalgia, unspecified: Secondary | ICD-10-CM | POA: Diagnosis not present

## 2020-10-14 DIAGNOSIS — E1122 Type 2 diabetes mellitus with diabetic chronic kidney disease: Secondary | ICD-10-CM | POA: Diagnosis not present

## 2020-10-14 DIAGNOSIS — M1A9XX1 Chronic gout, unspecified, with tophus (tophi): Secondary | ICD-10-CM | POA: Diagnosis not present

## 2020-10-14 DIAGNOSIS — M86171 Other acute osteomyelitis, right ankle and foot: Secondary | ICD-10-CM | POA: Diagnosis not present

## 2020-10-14 DIAGNOSIS — B955 Unspecified streptococcus as the cause of diseases classified elsewhere: Secondary | ICD-10-CM | POA: Diagnosis not present

## 2020-10-14 NOTE — Telephone Encounter (Signed)
Physical Therapy([facility])  called and stated that they  needed to come in the patients home and help the patient get around. She is having a hard time standing up. They would like to get PT approved for once a week for 5 weeks

## 2020-10-15 ENCOUNTER — Encounter: Payer: Self-pay | Admitting: Podiatry

## 2020-10-15 NOTE — Telephone Encounter (Signed)
Gave verbal

## 2020-10-15 NOTE — Progress Notes (Signed)
Subjective:  Patient ID: Samantha Mcgrath, female    DOB: 1951-03-16,  MRN: [id number]  Chief Complaint  Patient presents with  . Routine Post Op    POST OP DOS 5.7.22    Procedure: Right second digit amputation primarily closed  70 y.o. female returns for post-op check.  Patient states she is doing well.  No acute complaints.  The amputation was done by Dr. Cannon Mcgrath.  She just here for first postop check.  She is ambulating with a surgical shoe.  Review of Systems: Negative except as noted in the HPI. Denies N/V/F/Ch.  Past Medical History:  Diagnosis Date  . Asthma   . Bronchitis   . Cataract    bilateral  . Depression   . Diabetes mellitus without complication ([place])   . GERD (gastroesophageal reflux disease)   . Gout   . Hyperlipidemia   . Hypertension   . Pneumonia   . Restrictive airway disease   . Retinal detachment    left    Current Outpatient Medications:  .  acetaminophen (TYLENOL) 325 MG tablet, Take 2 tablets (650 mg total) by mouth every 6 (six) hours as needed for moderate pain., Disp: , Rfl:  .  allopurinol (ZYLOPRIM) 100 MG tablet, Take 1 tablet (100 mg total) by mouth daily., Disp: 30 tablet, Rfl: 0 .  amoxicillin-clavulanate (AUGMENTIN) 875-125 MG tablet, Take 1 tablet by mouth every 12 (twelve) hours for 10 days., Disp: 20 tablet, Rfl: 0 .  atorvastatin (LIPITOR) 10 MG tablet, Take 10 mg by mouth daily., Disp: , Rfl:  .  chlorthalidone (HYGROTON) 50 MG tablet, Take 50 mg by mouth daily., Disp: , Rfl:  .  Cholecalciferol (VITAMIN D3) 250 MCG (10000 UT) capsule, Take 10,000 Units by mouth daily., Disp: , Rfl:  .  diclofenac Sodium (VOLTAREN) 1 % GEL, Apply 2 g topically 4 (four) times daily. Apply to left knee, Disp: 2 g, Rfl: 0 .  gabapentin (NEURONTIN) 100 MG capsule, Take 1 capsule (100 mg total) by mouth 2 (two) times daily., Disp: 60 capsule, Rfl: 1 .  glipiZIDE (GLUCOTROL XL) 5 MG 24 hr tablet, Take 5 mg by mouth at bedtime., Disp: , Rfl:  .   Lactobacillus (PROBIOTIC ACIDOPHILUS PO), Take 1 tablet by mouth daily., Disp: , Rfl:  .  losartan (COZAAR) 25 MG tablet, TAKE 1 TABLET (25 MG TOTAL) BY MOUTH DAILY., Disp: 30 tablet, Rfl: 0 .  metFORMIN (GLUCOPHAGE-XR) 500 MG 24 hr tablet, Take 1,000 mg by mouth 2 (two) times daily., Disp: , Rfl:  .  montelukast (SINGULAIR) 10 MG tablet, Take 10 mg by mouth at bedtime., Disp: , Rfl:  .  oxyCODONE (OXY IR/ROXICODONE) 5 MG immediate release tablet, Take 1 tablet (5 mg total) by mouth every 6 (six) hours as needed for severe pain., Disp: 15 tablet, Rfl: 0 .  pantoprazole (PROTONIX) 40 MG tablet, Take 40 mg by mouth 2 (two) times daily., Disp: , Rfl:  .  tiZANidine (ZANAFLEX) 4 MG tablet, Take 1 tablet (4 mg total) by mouth daily as needed. (Patient taking differently: Take 4 mg by mouth daily as needed for muscle spasms.), Disp: 30 tablet, Rfl: 1  Social History   Tobacco Use  Smoking Status Never Smoker  Smokeless Tobacco Never Used    Allergies  Allergen Reactions  . Procaine Other (See Comments) and Palpitations    ALSO BLISTERS WITH TOPICAL Novacaine   Novocain. ALSO BLISTERS WITH TOPICAL   Objective:  There were no vitals filed for  this visit. There is no height or weight on file to calculate BMI. Constitutional Well developed. Well nourished.  Vascular Foot warm and well perfused. Capillary refill normal to all digits.   Neurologic Normal speech. Oriented to person, place, and time. Epicritic sensation to light touch grossly present bilaterally.  Dermatologic Skin healing well without signs of infection. Skin edges well coapted without signs of infection.  Orthopedic: Tenderness to palpation noted about the surgical site.   Radiographs: 3 views of skeletally mature the right foot: Interval amputation noted.  No signs of osteomyelitis noted.  No gas noted. Assessment:   1. History of amputation of lesser toe of right foot (Samantha Mcgrath)    Plan:  Patient was evaluated and treated  and all questions answered.  S/p foot surgery right -Progressing as expected post-operatively. -XR: See above -WB Status: Weightbearing as tolerated in surgical shoe -Sutures: Intact.  No clinical signs of dehiscence noted no complication noted. -Medications: None -Foot redressed.  Return in about 2 weeks (around 10/27/2020).

## 2020-10-19 DIAGNOSIS — G8929 Other chronic pain: Secondary | ICD-10-CM | POA: Diagnosis not present

## 2020-10-19 DIAGNOSIS — M1A9XX1 Chronic gout, unspecified, with tophus (tophi): Secondary | ICD-10-CM | POA: Diagnosis not present

## 2020-10-19 DIAGNOSIS — E1169 Type 2 diabetes mellitus with other specified complication: Secondary | ICD-10-CM | POA: Diagnosis not present

## 2020-10-19 DIAGNOSIS — I129 Hypertensive chronic kidney disease with stage 1 through stage 4 chronic kidney disease, or unspecified chronic kidney disease: Secondary | ICD-10-CM | POA: Diagnosis not present

## 2020-10-19 DIAGNOSIS — E1122 Type 2 diabetes mellitus with diabetic chronic kidney disease: Secondary | ICD-10-CM | POA: Diagnosis not present

## 2020-10-19 DIAGNOSIS — N1831 Chronic kidney disease, stage 3a: Secondary | ICD-10-CM | POA: Diagnosis not present

## 2020-10-19 DIAGNOSIS — M869 Osteomyelitis, unspecified: Secondary | ICD-10-CM | POA: Diagnosis not present

## 2020-10-19 DIAGNOSIS — B955 Unspecified streptococcus as the cause of diseases classified elsewhere: Secondary | ICD-10-CM | POA: Diagnosis not present

## 2020-10-19 DIAGNOSIS — Z4781 Encounter for orthopedic aftercare following surgical amputation: Secondary | ICD-10-CM | POA: Diagnosis not present

## 2020-10-19 DIAGNOSIS — M549 Dorsalgia, unspecified: Secondary | ICD-10-CM | POA: Diagnosis not present

## 2020-10-27 ENCOUNTER — Telehealth: Payer: Self-pay | Admitting: Family

## 2020-10-27 NOTE — Telephone Encounter (Signed)
I called the patient, I saw there was an appt made for 11/19/20 for a TOC follow up from the hospital and I checked the patients chart and the pcp was not listed. I checked her chart and there was a previous telephone encounter where the patient wanted to leave the practice and we took out pcp out since that is what the patient wanted to do. So I called the patient to confirm if she wanted to see our provider and continue care or not with [place] and she stated she would like to keep the appointment with Dinah and she really likes Webb Silversmith so she wanted to continue her care with [place]. So I told her I would change her pcp back to Dinah and keep her upcoming appt for 11/19/20.

## 2020-10-29 DIAGNOSIS — I129 Hypertensive chronic kidney disease with stage 1 through stage 4 chronic kidney disease, or unspecified chronic kidney disease: Secondary | ICD-10-CM | POA: Diagnosis not present

## 2020-10-29 DIAGNOSIS — M1A9XX1 Chronic gout, unspecified, with tophus (tophi): Secondary | ICD-10-CM | POA: Diagnosis not present

## 2020-10-29 DIAGNOSIS — M869 Osteomyelitis, unspecified: Secondary | ICD-10-CM | POA: Diagnosis not present

## 2020-10-29 DIAGNOSIS — B955 Unspecified streptococcus as the cause of diseases classified elsewhere: Secondary | ICD-10-CM | POA: Diagnosis not present

## 2020-10-29 DIAGNOSIS — G8929 Other chronic pain: Secondary | ICD-10-CM | POA: Diagnosis not present

## 2020-10-29 DIAGNOSIS — E1169 Type 2 diabetes mellitus with other specified complication: Secondary | ICD-10-CM | POA: Diagnosis not present

## 2020-10-29 DIAGNOSIS — M549 Dorsalgia, unspecified: Secondary | ICD-10-CM | POA: Diagnosis not present

## 2020-10-29 DIAGNOSIS — Z4781 Encounter for orthopedic aftercare following surgical amputation: Secondary | ICD-10-CM | POA: Diagnosis not present

## 2020-10-29 DIAGNOSIS — E1122 Type 2 diabetes mellitus with diabetic chronic kidney disease: Secondary | ICD-10-CM | POA: Diagnosis not present

## 2020-10-29 DIAGNOSIS — N1831 Chronic kidney disease, stage 3a: Secondary | ICD-10-CM | POA: Diagnosis not present

## 2020-10-31 ENCOUNTER — Other Ambulatory Visit: Payer: Self-pay | Admitting: Family

## 2020-10-31 DIAGNOSIS — I10 Essential (primary) hypertension: Secondary | ICD-10-CM

## 2020-11-02 DIAGNOSIS — Z08 Encounter for follow-up examination after completed treatment for malignant neoplasm: Secondary | ICD-10-CM | POA: Diagnosis not present

## 2020-11-02 DIAGNOSIS — L814 Other melanin hyperpigmentation: Secondary | ICD-10-CM | POA: Diagnosis not present

## 2020-11-02 DIAGNOSIS — Z85828 Personal history of other malignant neoplasm of skin: Secondary | ICD-10-CM | POA: Diagnosis not present

## 2020-11-02 DIAGNOSIS — Z86006 Personal history of melanoma in-situ: Secondary | ICD-10-CM | POA: Diagnosis not present

## 2020-11-02 DIAGNOSIS — Z7189 Other specified counseling: Secondary | ICD-10-CM | POA: Diagnosis not present

## 2020-11-02 DIAGNOSIS — D225 Melanocytic nevi of trunk: Secondary | ICD-10-CM | POA: Diagnosis not present

## 2020-11-02 DIAGNOSIS — L821 Other seborrheic keratosis: Secondary | ICD-10-CM | POA: Diagnosis not present

## 2020-11-02 DIAGNOSIS — D2272 Melanocytic nevi of left lower limb, including hip: Secondary | ICD-10-CM | POA: Diagnosis not present

## 2020-11-04 ENCOUNTER — Encounter: Payer: Self-pay | Admitting: Sports Medicine

## 2020-11-04 ENCOUNTER — Other Ambulatory Visit: Payer: Self-pay

## 2020-11-04 ENCOUNTER — Ambulatory Visit (INDEPENDENT_AMBULATORY_CARE_PROVIDER_SITE_OTHER): Payer: Medicare HMO | Admitting: Sports Medicine

## 2020-11-04 DIAGNOSIS — M869 Osteomyelitis, unspecified: Secondary | ICD-10-CM

## 2020-11-04 DIAGNOSIS — Z89421 Acquired absence of other right toe(s): Secondary | ICD-10-CM

## 2020-11-04 DIAGNOSIS — M79672 Pain in left foot: Secondary | ICD-10-CM

## 2020-11-04 DIAGNOSIS — L02611 Cutaneous abscess of right foot: Secondary | ICD-10-CM

## 2020-11-04 DIAGNOSIS — M79671 Pain in right foot: Secondary | ICD-10-CM

## 2020-11-04 DIAGNOSIS — L03031 Cellulitis of right toe: Secondary | ICD-10-CM

## 2020-11-04 DIAGNOSIS — M1A0710 Idiopathic chronic gout, right ankle and foot, without tophus (tophi): Secondary | ICD-10-CM

## 2020-11-04 MED ORDER — COLCHICINE 0.6 MG PO TABS: 0.6000 mg | ORAL_TABLET | Freq: Every day | ORAL | 0 refills | Status: DC

## 2020-11-04 MED ORDER — ALLOPURINOL 100 MG PO TABS: 100.0000 mg | ORAL_TABLET | Freq: Every day | ORAL | 0 refills | Status: DC

## 2020-11-04 NOTE — Patient Instructions (Addendum)
PCP  Dr. Theda Belfast. Baird Cancer, MD Internist in [place], [place] Address: [address], [place], [place] [address] Phone: [phone]

## 2020-11-04 NOTE — Progress Notes (Signed)
Subjective: Samantha Mcgrath is a 70 y.o. female patient seen today in office for POV #2 (DOS 10/09/2020), S/P right second toe amputation. Patient denies pain at surgical site, but does admit some pain in the ankle thinks that it may be gout, denies calf pain, denies headache, chest pain, shortness of breath, nausea, vomiting, fever, or chills. No other issues noted.   Patient Active Problem List   Diagnosis Date Noted  . Osteomyelitis due to type 2 diabetes mellitus ([place]) 10/04/2020  . Osteomyelitis of ankle or foot, acute, right ([place]) 10/04/2020  . Chronic renal failure, stage 3a ([place]) 10/08/2019  . Anxiety 07/08/2019  . Combined forms of age-related cataract of right eye 08/15/2018  . Gastroesophageal reflux disease without esophagitis 01/29/2018  . Asthma 01/28/2018  . Hyperlipidemia 01/28/2018  . Type 2 diabetes mellitus without complication, with long-term current use of insulin ([place]) 01/28/2018  . Retinal detachment of left eye with multiple breaks 11/14/2016  . Morbid obesity ([place]) 08/17/2015  . Essential hypertension 06/01/2014    Current Outpatient Medications on File Prior to Visit  Medication Sig Dispense Refill  . losartan (COZAAR) 25 MG tablet TAKE 1 TABLET (25 MG TOTAL) BY MOUTH DAILY. 90 tablet 0  . acetaminophen (TYLENOL) 325 MG tablet Take 2 tablets (650 mg total) by mouth every 6 (six) hours as needed for moderate pain.    Marland Kitchen atorvastatin (LIPITOR) 10 MG tablet Take 10 mg by mouth daily.    . chlorthalidone (HYGROTON) 50 MG tablet Take 50 mg by mouth daily.    . Cholecalciferol (VITAMIN D3) 250 MCG (10000 UT) capsule Take 10,000 Units by mouth daily.    . diclofenac Sodium (VOLTAREN) 1 % GEL Apply 2 g topically 4 (four) times daily. Apply to left knee 2 g 0  . gabapentin (NEURONTIN) 100 MG capsule Take 1 capsule (100 mg total) by mouth 2 (two) times daily. 60 capsule 1  . glipiZIDE (GLUCOTROL XL) 5 MG 24 hr tablet Take 5 mg by mouth at bedtime.    . Lactobacillus  (PROBIOTIC ACIDOPHILUS PO) Take 1 tablet by mouth daily.    . metFORMIN (GLUCOPHAGE-XR) 500 MG 24 hr tablet Take 1,000 mg by mouth 2 (two) times daily.    . montelukast (SINGULAIR) 10 MG tablet Take 10 mg by mouth at bedtime.    Marland Kitchen oxyCODONE (OXY IR/ROXICODONE) 5 MG immediate release tablet Take 1 tablet (5 mg total) by mouth every 6 (six) hours as needed for severe pain. 15 tablet 0  . pantoprazole (PROTONIX) 40 MG tablet Take 40 mg by mouth 2 (two) times daily.    Marland Kitchen tiZANidine (ZANAFLEX) 4 MG tablet Take 1 tablet (4 mg total) by mouth daily as needed. (Patient taking differently: Take 4 mg by mouth daily as needed for muscle spasms.) 30 tablet 1   No current facility-administered medications on file prior to visit.    Allergies  Allergen Reactions  . Procaine Other (See Comments) and Palpitations    ALSO BLISTERS WITH TOPICAL Novacaine   Novocain. ALSO BLISTERS WITH TOPICAL    Objective: There were no vitals filed for this visit.  General: No acute distress, AAOx3  Right foot: Sutures intact with no gapping or dehiscence at surgical site, status post right second toe amputation, mild swelling to to right foot and more significantly right ankle, no erythema, no warmth, no drainage, no signs of infection noted, however there is concern for a gout flareup occurring at the right ankle due to patient history, capillary fill  time <3 seconds in all remaining digits, gross sensation present via light touch to right foot. No pain or crepitation with range of motion right foot however there is some mild pain to the ankle with warmth concerning more for a gouty flareup in his joint due to patient history.  Patient also has an obvious gouty tophi present over the left second toe that appears to be stable.  No pain with calf compression.     Assessment and Plan:  Problem List Items Addressed This Visit   None   Visit Diagnoses    History of amputation of lesser toe of right foot ([place])    -   Primary   Cellulitis and abscess of toe of right foot       Osteomyelitis of second toe of right foot (H[facility])       Chronic gout of right foot, unspecified cause       Relevant Medications   allopurinol (ZYLOPRIM) 100 MG tablet   colchicine 0.6 MG tablet   Foot pain, bilateral           -Patient seen and evaluated -Sutures removed -Applied dry sterile dressing to surgical site right foot secured with Coban wrap and stockinet  -Advised patient to make sure to keep dressings clean, dry, and intact to right foot today on tomorrow may remove and shower and redress with a clean white sock -Advised patient to continue with post-op shoe on right foot may slowly wean to a normal shoe as tolerated and as swelling allows -Advised patient to limit activity to necessity  -Advised patient to ice and elevate as necessary  -Refilled allopurinol and colchicine for history of gout and advised patient to get in with her primary doctor for further management -Will plan for incision site check and x-rays at next office visit. In the meantime, patient to call office if any issues or problems arise.   Landis Martins, DPM

## 2020-11-05 DIAGNOSIS — N1831 Chronic kidney disease, stage 3a: Secondary | ICD-10-CM | POA: Diagnosis not present

## 2020-11-05 DIAGNOSIS — G8929 Other chronic pain: Secondary | ICD-10-CM | POA: Diagnosis not present

## 2020-11-05 DIAGNOSIS — M869 Osteomyelitis, unspecified: Secondary | ICD-10-CM | POA: Diagnosis not present

## 2020-11-05 DIAGNOSIS — B955 Unspecified streptococcus as the cause of diseases classified elsewhere: Secondary | ICD-10-CM | POA: Diagnosis not present

## 2020-11-05 DIAGNOSIS — E1122 Type 2 diabetes mellitus with diabetic chronic kidney disease: Secondary | ICD-10-CM | POA: Diagnosis not present

## 2020-11-05 DIAGNOSIS — E1169 Type 2 diabetes mellitus with other specified complication: Secondary | ICD-10-CM | POA: Diagnosis not present

## 2020-11-05 DIAGNOSIS — Z4781 Encounter for orthopedic aftercare following surgical amputation: Secondary | ICD-10-CM | POA: Diagnosis not present

## 2020-11-05 DIAGNOSIS — M549 Dorsalgia, unspecified: Secondary | ICD-10-CM | POA: Diagnosis not present

## 2020-11-05 DIAGNOSIS — M1A9XX1 Chronic gout, unspecified, with tophus (tophi): Secondary | ICD-10-CM | POA: Diagnosis not present

## 2020-11-05 DIAGNOSIS — I129 Hypertensive chronic kidney disease with stage 1 through stage 4 chronic kidney disease, or unspecified chronic kidney disease: Secondary | ICD-10-CM | POA: Diagnosis not present

## 2020-11-05 LAB — FUNGAL ORGANISM REFLEX

## 2020-11-05 LAB — FUNGUS CULTURE WITH STAIN

## 2020-11-05 LAB — FUNGUS CULTURE RESULT

## 2020-11-10 DIAGNOSIS — E1169 Type 2 diabetes mellitus with other specified complication: Secondary | ICD-10-CM | POA: Diagnosis not present

## 2020-11-10 DIAGNOSIS — E1122 Type 2 diabetes mellitus with diabetic chronic kidney disease: Secondary | ICD-10-CM | POA: Diagnosis not present

## 2020-11-10 DIAGNOSIS — G8929 Other chronic pain: Secondary | ICD-10-CM | POA: Diagnosis not present

## 2020-11-10 DIAGNOSIS — B955 Unspecified streptococcus as the cause of diseases classified elsewhere: Secondary | ICD-10-CM | POA: Diagnosis not present

## 2020-11-10 DIAGNOSIS — M549 Dorsalgia, unspecified: Secondary | ICD-10-CM | POA: Diagnosis not present

## 2020-11-10 DIAGNOSIS — N1831 Chronic kidney disease, stage 3a: Secondary | ICD-10-CM | POA: Diagnosis not present

## 2020-11-10 DIAGNOSIS — M1A9XX1 Chronic gout, unspecified, with tophus (tophi): Secondary | ICD-10-CM | POA: Diagnosis not present

## 2020-11-10 DIAGNOSIS — Z4781 Encounter for orthopedic aftercare following surgical amputation: Secondary | ICD-10-CM | POA: Diagnosis not present

## 2020-11-10 DIAGNOSIS — M869 Osteomyelitis, unspecified: Secondary | ICD-10-CM | POA: Diagnosis not present

## 2020-11-10 DIAGNOSIS — I129 Hypertensive chronic kidney disease with stage 1 through stage 4 chronic kidney disease, or unspecified chronic kidney disease: Secondary | ICD-10-CM | POA: Diagnosis not present

## 2020-11-11 DIAGNOSIS — H527 Unspecified disorder of refraction: Secondary | ICD-10-CM | POA: Diagnosis not present

## 2020-11-11 DIAGNOSIS — E119 Type 2 diabetes mellitus without complications: Secondary | ICD-10-CM | POA: Diagnosis not present

## 2020-11-11 DIAGNOSIS — H02831 Dermatochalasis of right upper eyelid: Secondary | ICD-10-CM | POA: Diagnosis not present

## 2020-11-11 DIAGNOSIS — H0100B Unspecified blepharitis left eye, upper and lower eyelids: Secondary | ICD-10-CM | POA: Diagnosis not present

## 2020-11-11 DIAGNOSIS — H02834 Dermatochalasis of left upper eyelid: Secondary | ICD-10-CM | POA: Diagnosis not present

## 2020-11-11 DIAGNOSIS — H40003 Preglaucoma, unspecified, bilateral: Secondary | ICD-10-CM | POA: Diagnosis not present

## 2020-11-11 DIAGNOSIS — H0100A Unspecified blepharitis right eye, upper and lower eyelids: Secondary | ICD-10-CM | POA: Diagnosis not present

## 2020-11-11 DIAGNOSIS — H18513 Endothelial corneal dystrophy, bilateral: Secondary | ICD-10-CM | POA: Diagnosis not present

## 2020-11-11 DIAGNOSIS — H35372 Puckering of macula, left eye: Secondary | ICD-10-CM | POA: Diagnosis not present

## 2020-11-11 DIAGNOSIS — H31002 Unspecified chorioretinal scars, left eye: Secondary | ICD-10-CM | POA: Diagnosis not present

## 2020-11-11 DIAGNOSIS — H26492 Other secondary cataract, left eye: Secondary | ICD-10-CM | POA: Diagnosis not present

## 2020-11-18 ENCOUNTER — Ambulatory Visit (INDEPENDENT_AMBULATORY_CARE_PROVIDER_SITE_OTHER): Payer: Medicare HMO

## 2020-11-18 ENCOUNTER — Encounter: Payer: Self-pay | Admitting: Sports Medicine

## 2020-11-18 ENCOUNTER — Ambulatory Visit (INDEPENDENT_AMBULATORY_CARE_PROVIDER_SITE_OTHER): Payer: Medicare HMO | Admitting: Sports Medicine

## 2020-11-18 ENCOUNTER — Other Ambulatory Visit: Payer: Self-pay

## 2020-11-18 DIAGNOSIS — L02611 Cutaneous abscess of right foot: Secondary | ICD-10-CM

## 2020-11-18 DIAGNOSIS — M869 Osteomyelitis, unspecified: Secondary | ICD-10-CM

## 2020-11-18 DIAGNOSIS — Z89421 Acquired absence of other right toe(s): Secondary | ICD-10-CM | POA: Diagnosis not present

## 2020-11-18 DIAGNOSIS — M1A0710 Idiopathic chronic gout, right ankle and foot, without tophus (tophi): Secondary | ICD-10-CM

## 2020-11-18 DIAGNOSIS — L03031 Cellulitis of right toe: Secondary | ICD-10-CM

## 2020-11-18 MED ORDER — ALLOPURINOL 100 MG PO TABS: 100.0000 mg | ORAL_TABLET | Freq: Every day | ORAL | 0 refills | Status: DC

## 2020-11-18 NOTE — Progress Notes (Signed)
Subjective: Samantha Mcgrath is a 70 y.o. female patient seen today in office for POV #3 (DOS 10/09/2020), S/P right second toe amputation. Patient denies pain at surgical site, and reports that she is doing very well and has an appointment to see her PCP for an initial new patient visit on next month, denies any constitutional symptoms at this time but does admit a little bit of hard skin that she has noticed at the bottom of her right foot underneath the fifth toe joint.  Denies any other pedal complaints at this time.  Patient is in a normal shoe with no issues.  Patient Active Problem List   Diagnosis Date Noted   Osteomyelitis due to type 2 diabetes mellitus ([place]) 10/04/2020   Osteomyelitis of ankle or foot, acute, right ([place]) 10/04/2020   Chronic cough 01/06/2020   Chronic renal failure, stage 3a ([place]) 10/08/2019   Anxiety 07/08/2019   Primary insomnia 03/28/2019   Primary osteoarthritis of left knee 12/24/2018   Combined forms of age-related cataract of right eye 08/15/2018   Gastroesophageal reflux disease without esophagitis 01/29/2018   Asthma 01/28/2018   Hyperlipidemia 01/28/2018   Type 2 diabetes mellitus without complication, with long-term current use of insulin ([place]) 01/28/2018   Retinal detachment of left eye with multiple breaks 11/14/2016   Morbid obesity ([place]) 08/17/2015   Postoperative wound dehiscence 08/17/2015   Ulcer of right leg, with fat layer exposed ([place]) 08/17/2015   Wound, surgical, infected, subsequent encounter 08/17/2015   Essential hypertension 06/01/2014    Current Outpatient Medications on File Prior to Visit  Medication Sig Dispense Refill   losartan (COZAAR) 25 MG tablet TAKE 1 TABLET (25 MG TOTAL) BY MOUTH DAILY. 90 tablet 0   acetaminophen (TYLENOL) 325 MG tablet Take 2 tablets (650 mg total) by mouth every 6 (six) hours as needed for moderate pain.     atorvastatin (LIPITOR) 10 MG tablet Take 10 mg by mouth daily.     chlorthalidone (HYGROTON) 50  MG tablet Take 50 mg by mouth daily.     Cholecalciferol (VITAMIN D3) 250 MCG (10000 UT) capsule Take 10,000 Units by mouth daily.     colchicine 0.6 MG tablet Take 1 tablet (0.6 mg total) by mouth daily for 7 days. 7 tablet 0   diclofenac Sodium (VOLTAREN) 1 % GEL Apply 2 g topically 4 (four) times daily. Apply to left knee 2 g 0   gabapentin (NEURONTIN) 100 MG capsule Take 1 capsule (100 mg total) by mouth 2 (two) times daily. 60 capsule 1   glipiZIDE (GLUCOTROL XL) 5 MG 24 hr tablet Take 5 mg by mouth at bedtime.     Lactobacillus (PROBIOTIC ACIDOPHILUS PO) Take 1 tablet by mouth daily.     metFORMIN (GLUCOPHAGE-XR) 500 MG 24 hr tablet Take 1,000 mg by mouth 2 (two) times daily.     montelukast (SINGULAIR) 10 MG tablet Take 10 mg by mouth at bedtime.     oxyCODONE (OXY IR/ROXICODONE) 5 MG immediate release tablet Take 1 tablet (5 mg total) by mouth every 6 (six) hours as needed for severe pain. 15 tablet 0   pantoprazole (PROTONIX) 40 MG tablet Take 40 mg by mouth 2 (two) times daily.     tiZANidine (ZANAFLEX) 4 MG tablet Take 1 tablet (4 mg total) by mouth daily as needed. (Patient taking differently: Take 4 mg by mouth daily as needed for muscle spasms.) 30 tablet 1   No current facility-administered medications on file prior to visit.  Allergies  Allergen Reactions   Procaine Other (See Comments) and Palpitations    ALSO BLISTERS WITH TOPICAL Novacaine   Novocain. ALSO BLISTERS WITH TOPICAL    Objective: There were no vitals filed for this visit.  General: No acute distress, AAOx3  Right foot: Surgical site well-healed.  There is mild reactive keratosis Submet 5 on the right there is minimal swelling noted to the amputation site on the right foot.  Capillary fill time intact all digits on the right foot.  No acute pain to palpation.    Left foot: Unchanged gouty tophi at the distal interphalangeal joint of the left second toe that appears to be stable.    Assessment and  Plan:  Problem List Items Addressed This Visit   None Visit Diagnoses     History of amputation of lesser toe of right foot ([place])    -  Primary   Relevant Orders   DG Foot Complete Right   Cellulitis and abscess of toe of right foot       Osteomyelitis of second toe of right foot (H[facility])       Chronic gout of right foot, unspecified cause       Relevant Medications   allopurinol (ZYLOPRIM) 100 MG tablet        -Patient seen and evaluated -X-rays consistent with postoperative status of toe amputation no other acute findings -Incision well-healed -At no additional charge mechanically debrided keratosis noted submet 5 on the right without incident applied Salinocaine with Band-Aid and advised patient to remove Band-Aid later today -Advised good supportive shoe that does not rub -Advised patient to limit activity to tolerance -Advised patient to ice and elevate as necessary  -Refilled allopurinol for history of gout until she can get into a primary doctor as scheduled on next month -Surgical site well-healed patient may come back as needed or sooner if symptoms worsen or if there are any questions or concerns  Landis Martins, DPM

## 2020-11-19 ENCOUNTER — Ambulatory Visit: Payer: Medicare HMO | Admitting: Family

## 2020-11-26 ENCOUNTER — Other Ambulatory Visit: Payer: Self-pay | Admitting: Sports Medicine

## 2020-11-29 NOTE — Telephone Encounter (Signed)
Please advise 

## 2020-12-05 ENCOUNTER — Encounter: Payer: Self-pay | Admitting: Cardiology

## 2020-12-21 DIAGNOSIS — Z9109 Other allergy status, other than to drugs and biological substances: Secondary | ICD-10-CM | POA: Diagnosis not present

## 2020-12-21 DIAGNOSIS — Z23 Encounter for immunization: Secondary | ICD-10-CM | POA: Diagnosis not present

## 2020-12-21 DIAGNOSIS — I10 Essential (primary) hypertension: Secondary | ICD-10-CM | POA: Diagnosis not present

## 2020-12-21 DIAGNOSIS — K449 Diaphragmatic hernia without obstruction or gangrene: Secondary | ICD-10-CM | POA: Diagnosis not present

## 2020-12-21 DIAGNOSIS — K219 Gastro-esophageal reflux disease without esophagitis: Secondary | ICD-10-CM | POA: Diagnosis not present

## 2020-12-21 DIAGNOSIS — M7918 Myalgia, other site: Secondary | ICD-10-CM | POA: Diagnosis not present

## 2020-12-21 DIAGNOSIS — E7800 Pure hypercholesterolemia, unspecified: Secondary | ICD-10-CM | POA: Diagnosis not present

## 2020-12-21 DIAGNOSIS — M1A9XX1 Chronic gout, unspecified, with tophus (tophi): Secondary | ICD-10-CM | POA: Diagnosis not present

## 2020-12-21 DIAGNOSIS — E1169 Type 2 diabetes mellitus with other specified complication: Secondary | ICD-10-CM | POA: Diagnosis not present

## 2020-12-30 DIAGNOSIS — Z20822 Contact with and (suspected) exposure to covid-19: Secondary | ICD-10-CM | POA: Diagnosis not present

## 2020-12-31 DIAGNOSIS — Z20822 Contact with and (suspected) exposure to covid-19: Secondary | ICD-10-CM | POA: Diagnosis not present

## 2020-12-31 DIAGNOSIS — R0981 Nasal congestion: Secondary | ICD-10-CM | POA: Diagnosis not present

## 2020-12-31 DIAGNOSIS — M791 Myalgia, unspecified site: Secondary | ICD-10-CM | POA: Diagnosis not present

## 2020-12-31 DIAGNOSIS — J029 Acute pharyngitis, unspecified: Secondary | ICD-10-CM | POA: Diagnosis not present

## 2020-12-31 DIAGNOSIS — R059 Cough, unspecified: Secondary | ICD-10-CM | POA: Diagnosis not present

## 2020-12-31 DIAGNOSIS — B349 Viral infection, unspecified: Secondary | ICD-10-CM | POA: Diagnosis not present

## 2021-01-14 DIAGNOSIS — M1A0721 Idiopathic chronic gout, left ankle and foot, with tophus (tophi): Secondary | ICD-10-CM | POA: Diagnosis not present

## 2021-01-19 DIAGNOSIS — E1169 Type 2 diabetes mellitus with other specified complication: Secondary | ICD-10-CM | POA: Diagnosis not present

## 2021-01-19 DIAGNOSIS — E7800 Pure hypercholesterolemia, unspecified: Secondary | ICD-10-CM | POA: Diagnosis not present

## 2021-01-19 DIAGNOSIS — I10 Essential (primary) hypertension: Secondary | ICD-10-CM | POA: Diagnosis not present

## 2021-01-19 DIAGNOSIS — M1A9XX1 Chronic gout, unspecified, with tophus (tophi): Secondary | ICD-10-CM | POA: Diagnosis not present

## 2021-01-21 DIAGNOSIS — E7800 Pure hypercholesterolemia, unspecified: Secondary | ICD-10-CM | POA: Diagnosis not present

## 2021-01-21 DIAGNOSIS — M1A9XX1 Chronic gout, unspecified, with tophus (tophi): Secondary | ICD-10-CM | POA: Diagnosis not present

## 2021-01-21 DIAGNOSIS — I10 Essential (primary) hypertension: Secondary | ICD-10-CM | POA: Diagnosis not present

## 2021-01-21 DIAGNOSIS — E1169 Type 2 diabetes mellitus with other specified complication: Secondary | ICD-10-CM | POA: Diagnosis not present

## 2021-01-21 DIAGNOSIS — K219 Gastro-esophageal reflux disease without esophagitis: Secondary | ICD-10-CM | POA: Diagnosis not present

## 2021-02-18 DIAGNOSIS — M1000 Idiopathic gout, unspecified site: Secondary | ICD-10-CM | POA: Diagnosis not present

## 2021-02-23 DIAGNOSIS — M1A00X0 Idiopathic chronic gout, unspecified site, without tophus (tophi): Secondary | ICD-10-CM | POA: Insufficient documentation

## 2021-02-23 DIAGNOSIS — M1A0721 Idiopathic chronic gout, left ankle and foot, with tophus (tophi): Secondary | ICD-10-CM | POA: Diagnosis not present

## 2021-02-28 ENCOUNTER — Other Ambulatory Visit (HOSPITAL_COMMUNITY): Payer: Self-pay | Admitting: Orthopedic Surgery

## 2021-03-03 ENCOUNTER — Other Ambulatory Visit: Payer: Self-pay

## 2021-03-03 ENCOUNTER — Encounter (HOSPITAL_BASED_OUTPATIENT_CLINIC_OR_DEPARTMENT_OTHER): Payer: Self-pay | Admitting: Orthopedic Surgery

## 2021-03-08 ENCOUNTER — Ambulatory Visit
Admission: RE | Admit: 2021-03-08 | Discharge: 2021-03-08 | Disposition: A | Discharge status: Home / Self Care | Payer: Medicare HMO | Source: Ambulatory Visit | Attending: Orthopedic Surgery | Admitting: Orthopedic Surgery

## 2021-03-08 ENCOUNTER — Other Ambulatory Visit: Payer: Self-pay

## 2021-03-08 DIAGNOSIS — Z1231 Encounter for screening mammogram for malignant neoplasm of breast: Secondary | ICD-10-CM

## 2021-03-08 DIAGNOSIS — Z780 Asymptomatic menopausal state: Secondary | ICD-10-CM | POA: Diagnosis not present

## 2021-03-08 DIAGNOSIS — M85852 Other specified disorders of bone density and structure, left thigh: Secondary | ICD-10-CM | POA: Diagnosis not present

## 2021-03-08 DIAGNOSIS — Z13820 Encounter for screening for osteoporosis: Secondary | ICD-10-CM

## 2021-03-10 ENCOUNTER — Ambulatory Visit (HOSPITAL_BASED_OUTPATIENT_CLINIC_OR_DEPARTMENT_OTHER): Admission: RE | Admit: 2021-03-10 | Payer: Medicare HMO | Source: Home / Self Care | Admitting: Orthopedic Surgery

## 2021-03-10 SURGERY — AMPUTATION, TOE
Anesthesia: Choice | Site: Toe | Laterality: Left

## 2021-03-12 ENCOUNTER — Other Ambulatory Visit (HOSPITAL_COMMUNITY): Payer: Self-pay | Admitting: Orthopedic Surgery

## 2021-03-18 IMAGING — CR DG HIP (WITH OR WITHOUT PELVIS) 2-3V*L*
3 series · 3 of 3 positions shown · non-contrast
Comparison: None.

CLINICAL DATA: Left hip and low back pain without trauma

EXAM:
DG HIP (WITH OR WITHOUT PELVIS) 2-3V LEFT

[t pelvis ap]
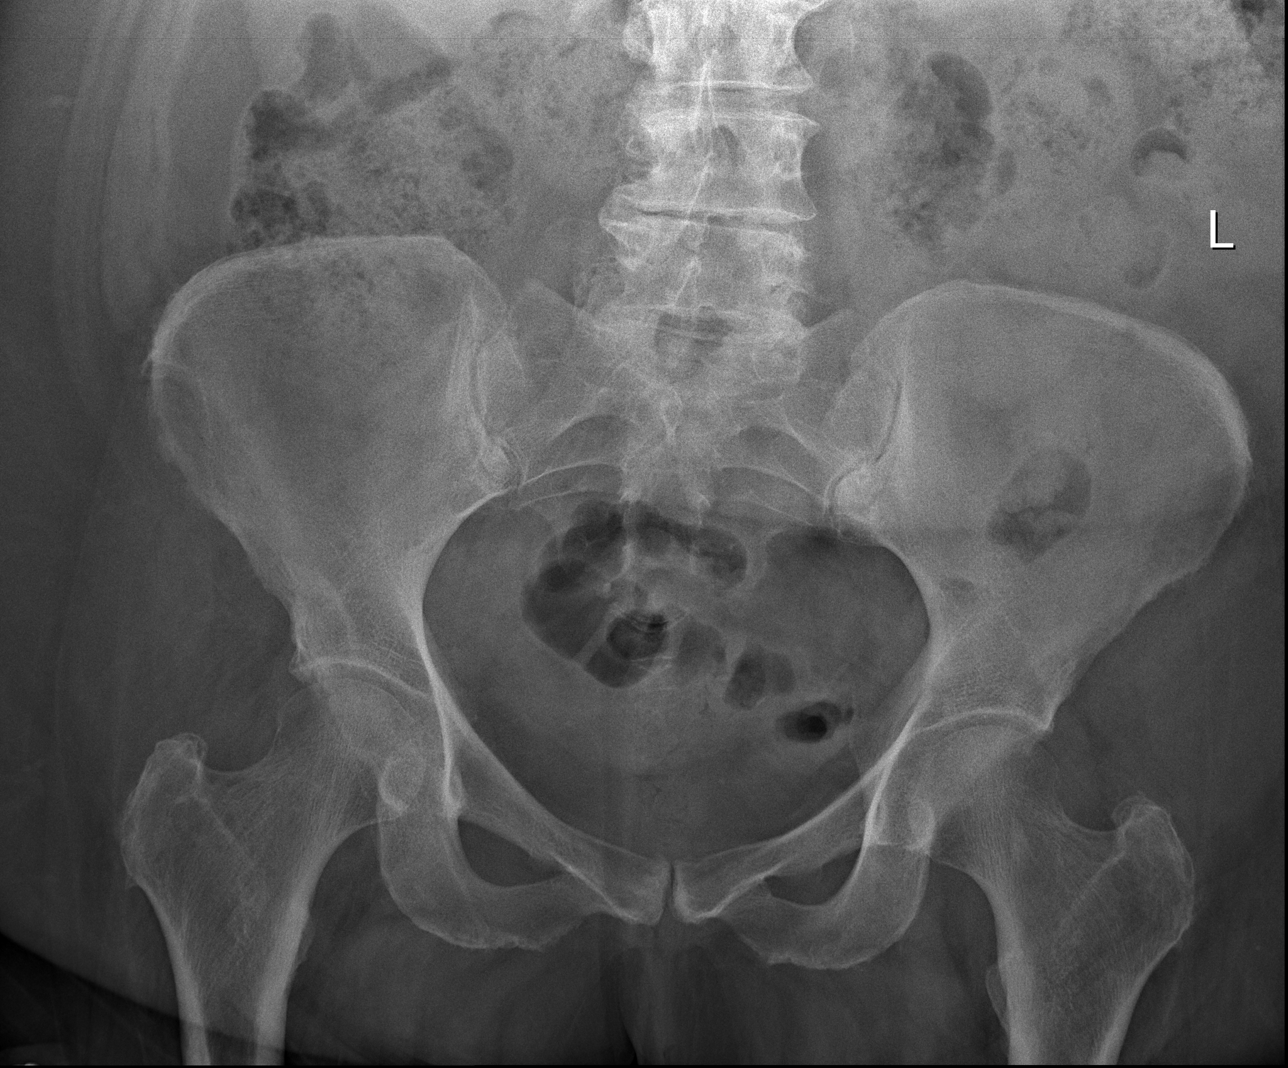

[t hip ap left]
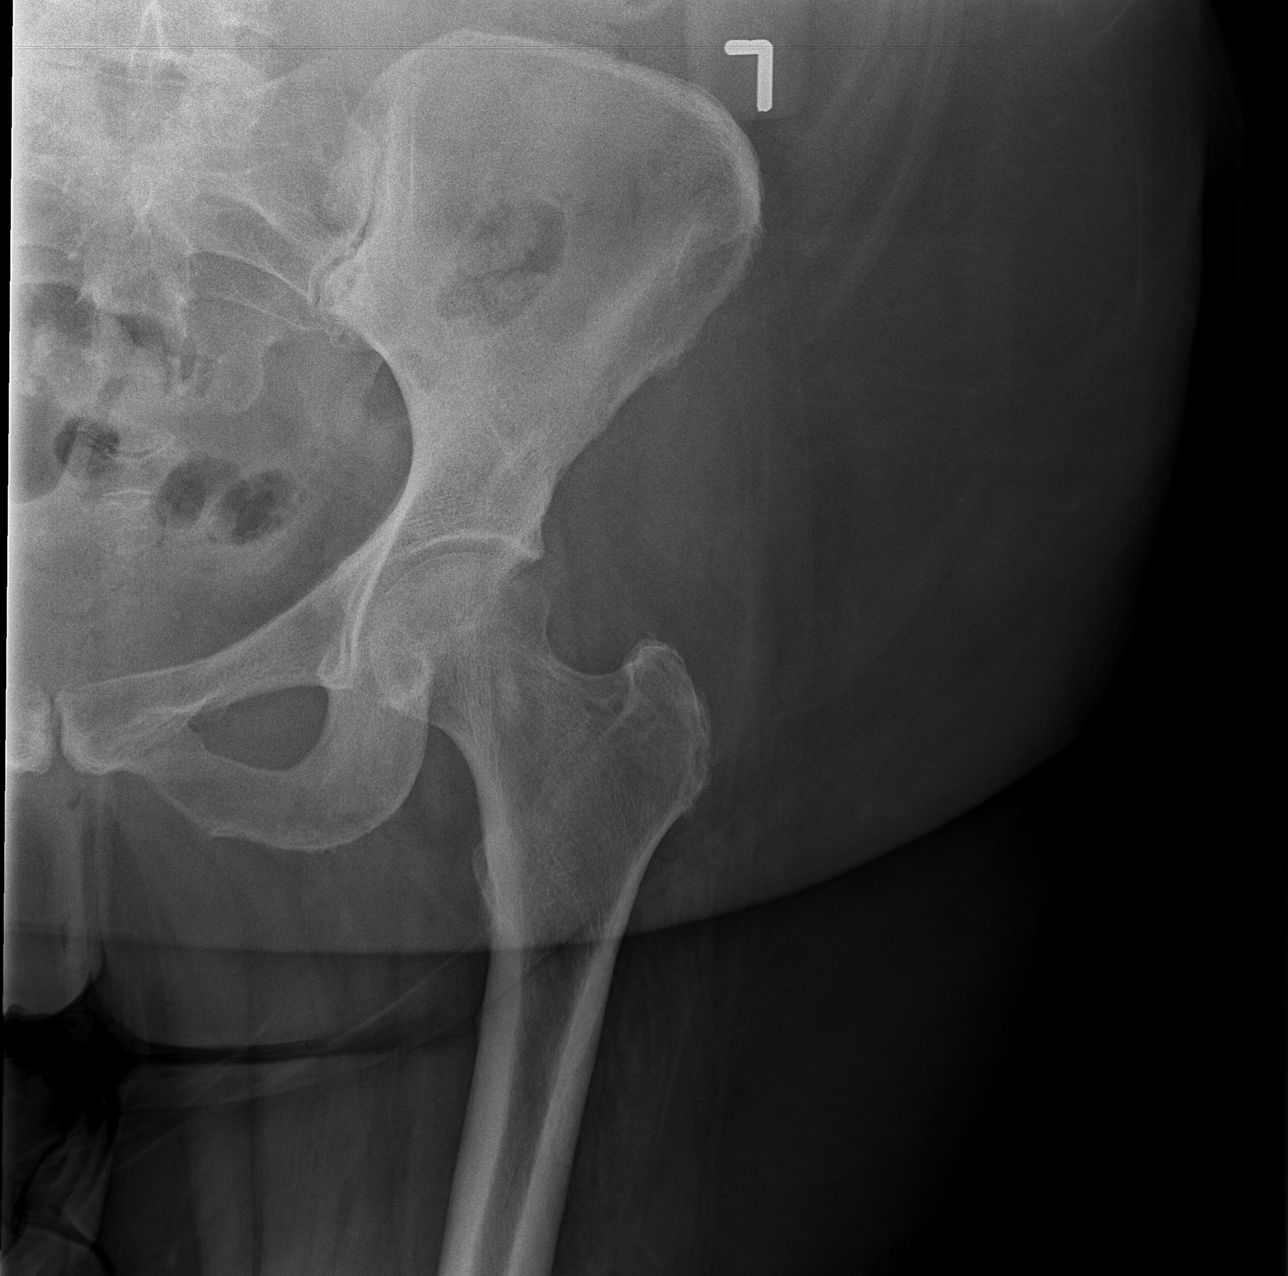

[t hip frog left]
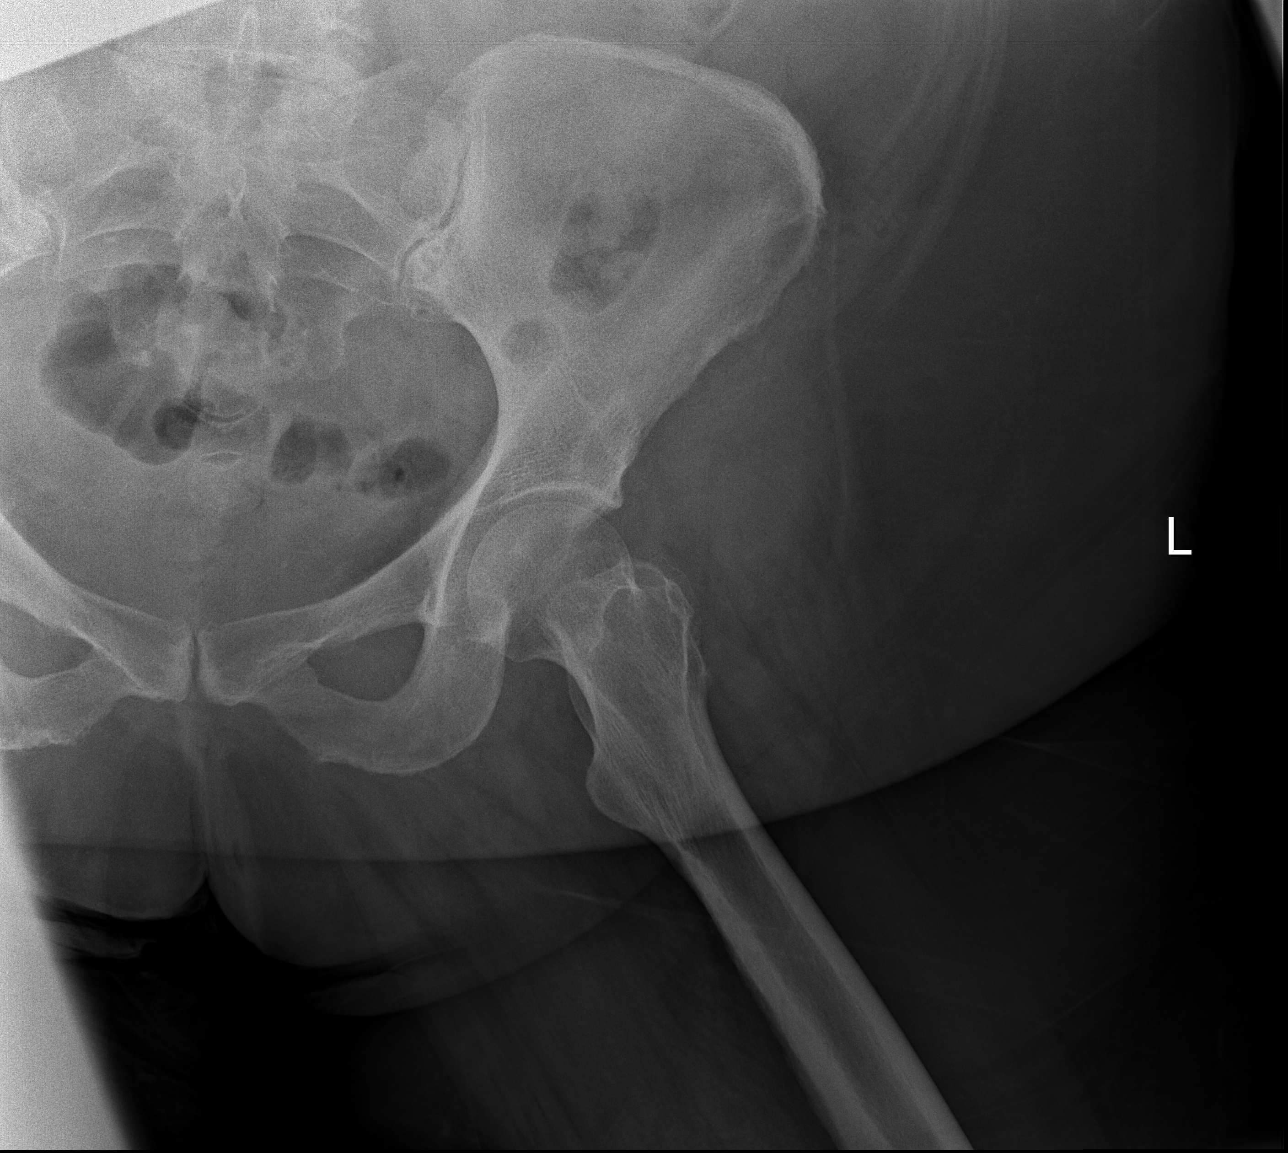

[3 of 3 positions shown; findings below may reference images not displayed]

FINDINGS: No acute fracture or dislocation. No focal osseous lesion. Mild
degenerative sclerosis of the left sacroiliac joint. Eccentric right
degenerative disc disease at L4-5.
IMPRESSION: No acute osseous abnormality.

## 2021-03-22 DIAGNOSIS — Z7984 Long term (current) use of oral hypoglycemic drugs: Secondary | ICD-10-CM | POA: Diagnosis not present

## 2021-03-22 DIAGNOSIS — M109 Gout, unspecified: Secondary | ICD-10-CM | POA: Diagnosis not present

## 2021-03-22 DIAGNOSIS — K219 Gastro-esophageal reflux disease without esophagitis: Secondary | ICD-10-CM | POA: Diagnosis not present

## 2021-03-22 DIAGNOSIS — E119 Type 2 diabetes mellitus without complications: Secondary | ICD-10-CM | POA: Diagnosis not present

## 2021-03-22 DIAGNOSIS — E785 Hyperlipidemia, unspecified: Secondary | ICD-10-CM | POA: Diagnosis not present

## 2021-03-22 DIAGNOSIS — M1990 Unspecified osteoarthritis, unspecified site: Secondary | ICD-10-CM | POA: Diagnosis not present

## 2021-03-22 DIAGNOSIS — J45909 Unspecified asthma, uncomplicated: Secondary | ICD-10-CM | POA: Diagnosis not present

## 2021-03-22 DIAGNOSIS — Z89421 Acquired absence of other right toe(s): Secondary | ICD-10-CM | POA: Diagnosis not present

## 2021-03-22 DIAGNOSIS — I10 Essential (primary) hypertension: Secondary | ICD-10-CM | POA: Diagnosis not present

## 2021-03-23 DIAGNOSIS — M1A9XX1 Chronic gout, unspecified, with tophus (tophi): Secondary | ICD-10-CM | POA: Diagnosis not present

## 2021-03-23 DIAGNOSIS — E7800 Pure hypercholesterolemia, unspecified: Secondary | ICD-10-CM | POA: Diagnosis not present

## 2021-03-23 DIAGNOSIS — E1169 Type 2 diabetes mellitus with other specified complication: Secondary | ICD-10-CM | POA: Diagnosis not present

## 2021-03-25 DIAGNOSIS — Z7185 Encounter for immunization safety counseling: Secondary | ICD-10-CM | POA: Diagnosis not present

## 2021-03-25 DIAGNOSIS — Z9109 Other allergy status, other than to drugs and biological substances: Secondary | ICD-10-CM | POA: Diagnosis not present

## 2021-03-25 DIAGNOSIS — I10 Essential (primary) hypertension: Secondary | ICD-10-CM | POA: Diagnosis not present

## 2021-03-25 DIAGNOSIS — Z0000 Encounter for general adult medical examination without abnormal findings: Secondary | ICD-10-CM | POA: Diagnosis not present

## 2021-03-25 DIAGNOSIS — E7800 Pure hypercholesterolemia, unspecified: Secondary | ICD-10-CM | POA: Diagnosis not present

## 2021-03-25 DIAGNOSIS — M8580 Other specified disorders of bone density and structure, unspecified site: Secondary | ICD-10-CM | POA: Diagnosis not present

## 2021-03-25 DIAGNOSIS — M1A0790 Idiopathic chronic gout, unspecified ankle and foot, without tophus (tophi): Secondary | ICD-10-CM | POA: Diagnosis not present

## 2021-03-25 DIAGNOSIS — E1169 Type 2 diabetes mellitus with other specified complication: Secondary | ICD-10-CM | POA: Diagnosis not present

## 2021-03-25 DIAGNOSIS — R69 Illness, unspecified: Secondary | ICD-10-CM | POA: Diagnosis not present

## 2021-03-25 DIAGNOSIS — K219 Gastro-esophageal reflux disease without esophagitis: Secondary | ICD-10-CM | POA: Diagnosis not present

## 2021-04-26 DIAGNOSIS — R69 Illness, unspecified: Secondary | ICD-10-CM | POA: Diagnosis not present

## 2021-04-26 DIAGNOSIS — I10 Essential (primary) hypertension: Secondary | ICD-10-CM | POA: Diagnosis not present

## 2021-04-26 DIAGNOSIS — M1A9XX1 Chronic gout, unspecified, with tophus (tophi): Secondary | ICD-10-CM | POA: Diagnosis not present

## 2021-04-26 DIAGNOSIS — E7800 Pure hypercholesterolemia, unspecified: Secondary | ICD-10-CM | POA: Diagnosis not present

## 2021-04-26 DIAGNOSIS — Z1211 Encounter for screening for malignant neoplasm of colon: Secondary | ICD-10-CM | POA: Diagnosis not present

## 2021-04-26 DIAGNOSIS — E1169 Type 2 diabetes mellitus with other specified complication: Secondary | ICD-10-CM | POA: Diagnosis not present

## 2021-04-26 DIAGNOSIS — Z23 Encounter for immunization: Secondary | ICD-10-CM | POA: Diagnosis not present

## 2021-05-09 DIAGNOSIS — R051 Acute cough: Secondary | ICD-10-CM | POA: Diagnosis not present

## 2021-05-09 DIAGNOSIS — J22 Unspecified acute lower respiratory infection: Secondary | ICD-10-CM | POA: Diagnosis not present

## 2021-05-16 DIAGNOSIS — H40003 Preglaucoma, unspecified, bilateral: Secondary | ICD-10-CM | POA: Diagnosis not present

## 2021-05-24 ENCOUNTER — Other Ambulatory Visit: Payer: Self-pay

## 2021-05-24 ENCOUNTER — Encounter (HOSPITAL_BASED_OUTPATIENT_CLINIC_OR_DEPARTMENT_OTHER): Payer: Self-pay | Admitting: Orthopedic Surgery

## 2021-05-31 ENCOUNTER — Ambulatory Visit (HOSPITAL_BASED_OUTPATIENT_CLINIC_OR_DEPARTMENT_OTHER)
Admission: RE | Admit: 2021-05-31 | Discharge: 2021-05-31 | Disposition: A | Discharge status: Home / Self Care | Payer: Medicare HMO | Attending: Orthopedic Surgery | Admitting: Orthopedic Surgery

## 2021-05-31 DIAGNOSIS — E119 Type 2 diabetes mellitus without complications: Secondary | ICD-10-CM | POA: Insufficient documentation

## 2021-05-31 DIAGNOSIS — Z794 Long term (current) use of insulin: Secondary | ICD-10-CM | POA: Insufficient documentation

## 2021-05-31 DIAGNOSIS — Z01812 Encounter for preprocedural laboratory examination: Secondary | ICD-10-CM | POA: Insufficient documentation

## 2021-05-31 DIAGNOSIS — I10 Essential (primary) hypertension: Secondary | ICD-10-CM

## 2021-06-01 ENCOUNTER — Ambulatory Visit (HOSPITAL_BASED_OUTPATIENT_CLINIC_OR_DEPARTMENT_OTHER): Payer: Medicare HMO

## 2021-06-01 DIAGNOSIS — Z794 Long term (current) use of insulin: Secondary | ICD-10-CM | POA: Diagnosis not present

## 2021-06-01 DIAGNOSIS — I10 Essential (primary) hypertension: Secondary | ICD-10-CM | POA: Diagnosis not present

## 2021-06-01 DIAGNOSIS — Z01812 Encounter for preprocedural laboratory examination: Secondary | ICD-10-CM | POA: Diagnosis not present

## 2021-06-01 DIAGNOSIS — E119 Type 2 diabetes mellitus without complications: Secondary | ICD-10-CM | POA: Diagnosis present

## 2021-06-01 LAB — BASIC METABOLIC PANEL
Anion gap: 8 (ref 5–15)
BUN: 16 mg/dL (ref 8–23)
CO2: 26 mmol/L (ref 22–32)
Calcium: 9.5 mg/dL (ref 8.9–10.3)
Chloride: 101 mmol/L (ref 98–111)
Creatinine, Ser: 0.87 mg/dL (ref 0.44–1.00)
GFR, Estimated: >60 mL/min (ref >60)
Glucose, Bld: 140 mg/dL — ABNORMAL HIGH (ref 70–99)
Potassium: 4.7 mmol/L (ref 3.5–5.1)
Sodium: 135 mmol/L (ref 135–145)

## 2021-06-01 NOTE — Progress Notes (Signed)
Patient notified via text for the need to have labs completed before surgery.

## 2021-06-01 NOTE — Progress Notes (Signed)

## 2021-06-01 NOTE — Anesthesia Preprocedure Evaluation (Addendum)
Anesthesia Evaluation    Reviewed: Allergy & Precautions, Patient's Chart, lab work & pertinent test results, Unable to perform ROS - Chart review only  Airway Mallampati: I  TM Distance: >3 FB Neck ROM: Full    Dental  (+) Teeth Intact, Dental Advisory Given   Pulmonary asthma ,    Pulmonary exam normal breath sounds clear to auscultation       Cardiovascular hypertension, Pt. on medications Normal cardiovascular exam Rhythm:Regular Rate:Normal  TTE 2022 1. Left ventricular ejection fraction, by estimation, is 60 to 65%. The  left ventricle has normal function. The left ventricle has no regional  wall motion abnormalities. There is mild concentric left ventricular  hypertrophy. Left ventricular diastolic  parameters are consistent with Grade I diastolic dysfunction (impaired  relaxation). Elevated left ventricular end-diastolic pressure.  2. Right ventricular systolic function is normal. The right ventricular  size is normal.  3. The mitral valve is degenerative. No evidence of mitral valve  regurgitation. No evidence of mitral stenosis. Severe mitral annular  calcification.  4. The aortic valve is normal in structure. Aortic valve regurgitation is  not visualized. No aortic stenosis is present.  5. The inferior vena cava is normal in size with greater than 50%  respiratory variability, suggesting right atrial pressure of 3 mmHg.    Neuro/Psych negative neurological ROS  negative psych ROS   GI/Hepatic Neg liver ROS, GERD  Controlled and Medicated,  Endo/Other  negative endocrine ROSdiabetes, Type 2, Oral Hypoglycemic AgentsObese BMI 37  Renal/GU Renal InsufficiencyRenal disease (Cr 1.21, K 3.2)  negative genitourinary   Musculoskeletal negative musculoskeletal ROS (+)   Abdominal (+) + obese,   Peds  Hematology negative hematology ROS (+)   Anesthesia Other Findings   Reproductive/Obstetrics                             Anesthesia Physical  Anesthesia Plan  ASA: 3  Anesthesia Plan: MAC   Post-op Pain Management: Minimal or no pain anticipated   Induction: Intravenous  PONV Risk Score and Plan: 2 and Propofol infusion, Treatment may vary due to age or medical condition and Ondansetron  Airway Management Planned: Natural Airway  Additional Equipment:   Intra-op Plan:   Post-operative Plan:   Informed Consent: I have reviewed the patients History and Physical, chart, labs and discussed the procedure including the risks, benefits and alternatives for the proposed anesthesia with the patient or authorized representative who has indicated his/her understanding and acceptance.     Dental advisory given  Plan Discussed with: CRNA  Anesthesia Plan Comments:        Anesthesia Quick Evaluation

## 2021-06-02 ENCOUNTER — Encounter (HOSPITAL_BASED_OUTPATIENT_CLINIC_OR_DEPARTMENT_OTHER)
Admission: AD | Disposition: A | Discharge status: Home / Self Care | Payer: Self-pay | Source: Ambulatory Visit | Attending: Orthopedic Surgery

## 2021-06-02 ENCOUNTER — Other Ambulatory Visit: Payer: Self-pay

## 2021-06-02 ENCOUNTER — Ambulatory Visit (HOSPITAL_BASED_OUTPATIENT_CLINIC_OR_DEPARTMENT_OTHER): Payer: Medicare HMO | Admitting: Certified Registered"

## 2021-06-02 ENCOUNTER — Ambulatory Visit (HOSPITAL_BASED_OUTPATIENT_CLINIC_OR_DEPARTMENT_OTHER): Payer: Medicare HMO

## 2021-06-02 ENCOUNTER — Ambulatory Visit (HOSPITAL_BASED_OUTPATIENT_CLINIC_OR_DEPARTMENT_OTHER)
Admission: AD | Admit: 2021-06-02 | Discharge: 2021-06-02 | Disposition: A | Discharge status: Home / Self Care | Payer: Medicare HMO | Source: Ambulatory Visit | Attending: Orthopedic Surgery | Admitting: Orthopedic Surgery

## 2021-06-02 ENCOUNTER — Encounter (HOSPITAL_BASED_OUTPATIENT_CLINIC_OR_DEPARTMENT_OTHER): Payer: Self-pay | Admitting: Orthopedic Surgery

## 2021-06-02 DIAGNOSIS — Z79899 Other long term (current) drug therapy: Secondary | ICD-10-CM | POA: Diagnosis not present

## 2021-06-02 DIAGNOSIS — E119 Type 2 diabetes mellitus without complications: Secondary | ICD-10-CM | POA: Diagnosis not present

## 2021-06-02 DIAGNOSIS — N289 Disorder of kidney and ureter, unspecified: Secondary | ICD-10-CM | POA: Diagnosis not present

## 2021-06-02 DIAGNOSIS — Z7984 Long term (current) use of oral hypoglycemic drugs: Secondary | ICD-10-CM | POA: Diagnosis not present

## 2021-06-02 DIAGNOSIS — M1A0721 Idiopathic chronic gout, left ankle and foot, with tophus (tophi): Secondary | ICD-10-CM | POA: Diagnosis not present

## 2021-06-02 DIAGNOSIS — K219 Gastro-esophageal reflux disease without esophagitis: Secondary | ICD-10-CM | POA: Insufficient documentation

## 2021-06-02 DIAGNOSIS — M1A9XX0 Chronic gout, unspecified, without tophus (tophi): Secondary | ICD-10-CM | POA: Insufficient documentation

## 2021-06-02 DIAGNOSIS — I10 Essential (primary) hypertension: Secondary | ICD-10-CM | POA: Insufficient documentation

## 2021-06-02 DIAGNOSIS — J45909 Unspecified asthma, uncomplicated: Secondary | ICD-10-CM | POA: Diagnosis not present

## 2021-06-02 DIAGNOSIS — E669 Obesity, unspecified: Secondary | ICD-10-CM | POA: Insufficient documentation

## 2021-06-02 DIAGNOSIS — Z6836 Body mass index (BMI) 36.0-36.9, adult: Secondary | ICD-10-CM | POA: Insufficient documentation

## 2021-06-02 DIAGNOSIS — M1A9XX1 Chronic gout, unspecified, with tophus (tophi): Secondary | ICD-10-CM

## 2021-06-02 HISTORY — PX: AMPUTATION TOE: SHX6595

## 2021-06-02 LAB — GLUCOSE, CAPILLARY
Glucose-Capillary: 155 mg/dL — ABNORMAL HIGH (ref 70–99)
Glucose-Capillary: 183 mg/dL — ABNORMAL HIGH (ref 70–99)

## 2021-06-02 SURGERY — AMPUTATION, TOE
Anesthesia: Monitor Anesthesia Care | Site: Toe | Laterality: Left

## 2021-06-02 MED ORDER — LIDOCAINE 2% (20 MG/ML) 5 ML SYRINGE
INTRAMUSCULAR | Status: DC | PRN
  Administered 2021-06-02: 60 mg via INTRAVENOUS

## 2021-06-02 MED ORDER — FENTANYL CITRATE (PF) 100 MCG/2ML IJ SOLN: 25.0000 ug | INTRAMUSCULAR | Status: DC | PRN

## 2021-06-02 MED ORDER — CEFAZOLIN SODIUM-DEXTROSE 2-4 GM/100ML-% IV SOLN
INTRAVENOUS | Status: AC
  Filled 2021-06-02: qty 100

## 2021-06-02 MED ORDER — OXYCODONE HCL 5 MG/5ML PO SOLN: 5.0000 mg | Freq: Once | ORAL | Status: DC | PRN

## 2021-06-02 MED ORDER — 0.9 % SODIUM CHLORIDE (POUR BTL) OPTIME
TOPICAL | Status: DC | PRN
  Administered 2021-06-02: 08:00:00 200 mL

## 2021-06-02 MED ORDER — CEFAZOLIN SODIUM-DEXTROSE 2-4 GM/100ML-% IV SOLN
2.0000 g | INTRAVENOUS | Status: AC
  Administered 2021-06-02: 08:00:00 2 g via INTRAVENOUS

## 2021-06-02 MED ORDER — OXYCODONE HCL 5 MG PO TABS: 5.0000 mg | ORAL_TABLET | Freq: Once | ORAL | Status: DC | PRN

## 2021-06-02 MED ORDER — FENTANYL CITRATE (PF) 100 MCG/2ML IJ SOLN
INTRAMUSCULAR | Status: DC | PRN
  Administered 2021-06-02: 50 ug via INTRAVENOUS

## 2021-06-02 MED ORDER — LIDOCAINE HCL 2 % IJ SOLN
INTRAMUSCULAR | Status: AC
  Filled 2021-06-02: qty 20

## 2021-06-02 MED ORDER — SODIUM CHLORIDE 0.9 % IV SOLN: INTRAVENOUS | Status: DC

## 2021-06-02 MED ORDER — LACTATED RINGERS IV SOLN: INTRAVENOUS | Status: DC

## 2021-06-02 MED ORDER — BUPIVACAINE-EPINEPHRINE (PF) 0.5% -1:200000 IJ SOLN
INTRAMUSCULAR | Status: AC
  Filled 2021-06-02: qty 30

## 2021-06-02 MED ORDER — ONDANSETRON HCL 4 MG/2ML IJ SOLN
INTRAMUSCULAR | Status: DC | PRN
  Administered 2021-06-02: 4 mg via INTRAVENOUS

## 2021-06-02 MED ORDER — FENTANYL CITRATE (PF) 100 MCG/2ML IJ SOLN
INTRAMUSCULAR | Status: AC
  Filled 2021-06-02: qty 2

## 2021-06-02 MED ORDER — SENNA 8.6 MG PO TABS: 2.0000 | ORAL_TABLET | Freq: Two times a day (BID) | ORAL | 0 refills | Status: DC

## 2021-06-02 MED ORDER — PROPOFOL 500 MG/50ML IV EMUL
INTRAVENOUS | Status: DC | PRN
  Administered 2021-06-02: 75 ug/kg/min via INTRAVENOUS

## 2021-06-02 MED ORDER — VANCOMYCIN HCL 500 MG IV SOLR
INTRAVENOUS | Status: AC
  Filled 2021-06-02: qty 10

## 2021-06-02 MED ORDER — OXYCODONE HCL 5 MG PO TABS: 5.0000 mg | ORAL_TABLET | Freq: Four times a day (QID) | ORAL | 0 refills | Status: AC | PRN

## 2021-06-02 MED ORDER — BUPIVACAINE-EPINEPHRINE 0.5% -1:200000 IJ SOLN
INTRAMUSCULAR | Status: DC | PRN
  Administered 2021-06-02: 10 mL

## 2021-06-02 MED ORDER — DOCUSATE SODIUM 100 MG PO CAPS: 100.0000 mg | ORAL_CAPSULE | Freq: Two times a day (BID) | ORAL | 0 refills | Status: DC

## 2021-06-02 SURGICAL SUPPLY — 60 items
APL PRP STRL LF DISP 70% ISPRP (MISCELLANEOUS) ×1
BLADE AVERAGE 25X9 (BLADE) IMPLANT
BLADE OSC/SAG .038X5.5 CUT EDG (BLADE) IMPLANT
BLADE SURG 10 STRL SS (BLADE) IMPLANT
BLADE SURG 15 STRL LF DISP TIS (BLADE) ×2 IMPLANT
BLADE SURG 15 STRL SS (BLADE) ×2
BNDG CMPR 9X4 STRL LF SNTH (GAUZE/BANDAGES/DRESSINGS) ×1
BNDG COHESIVE 4X5 TAN ST LF (GAUZE/BANDAGES/DRESSINGS) ×3 IMPLANT
BNDG CONFORM 3 STRL LF (GAUZE/BANDAGES/DRESSINGS) IMPLANT
BNDG ESMARK 4X9 LF (GAUZE/BANDAGES/DRESSINGS) ×3 IMPLANT
CHLORAPREP W/TINT 26 (MISCELLANEOUS) ×3 IMPLANT
COVER BACK TABLE 60X90IN (DRAPES) ×3 IMPLANT
CUFF TOURN SGL QUICK 24 (TOURNIQUET CUFF)
CUFF TRNQT CYL 24X4X16.5-23 (TOURNIQUET CUFF) IMPLANT
DECANTER SPIKE VIAL GLASS SM (MISCELLANEOUS) IMPLANT
DRAPE EXTREMITY T 121X128X90 (DISPOSABLE) ×3 IMPLANT
DRAPE SURG 17X23 STRL (DRAPES) ×3 IMPLANT
DRAPE U-SHAPE 47X51 STRL (DRAPES) ×3 IMPLANT
DRSG EMULSION OIL 3X3 NADH (GAUZE/BANDAGES/DRESSINGS) IMPLANT
DRSG MEPITEL 4X7.2 (GAUZE/BANDAGES/DRESSINGS) ×3 IMPLANT
DRSG PAD ABDOMINAL 8X10 ST (GAUZE/BANDAGES/DRESSINGS) IMPLANT
ELECT REM PT RETURN 9FT ADLT (ELECTROSURGICAL) ×2
ELECTRODE REM PT RTRN 9FT ADLT (ELECTROSURGICAL) ×2 IMPLANT
GAUZE SPONGE 4X4 12PLY STRL (GAUZE/BANDAGES/DRESSINGS) ×3 IMPLANT
GLOVE SRG 8 PF TXTR STRL LF DI (GLOVE) ×4 IMPLANT
GLOVE SURG ENC MOIS LTX SZ8 (GLOVE) ×3 IMPLANT
GLOVE SURG LTX SZ8 (GLOVE) ×3 IMPLANT
GLOVE SURG UNDER POLY LF SZ8 (GLOVE) ×4
GOWN STRL REUS W/ TWL LRG LVL3 (GOWN DISPOSABLE) ×2 IMPLANT
GOWN STRL REUS W/ TWL XL LVL3 (GOWN DISPOSABLE) ×4 IMPLANT
GOWN STRL REUS W/TWL LRG LVL3 (GOWN DISPOSABLE) ×2
GOWN STRL REUS W/TWL XL LVL3 (GOWN DISPOSABLE) ×4
NDL HYPO 25X1 1.5 SAFETY (NEEDLE) IMPLANT
NDL SAFETY ECLIPSE 18X1.5 (NEEDLE) IMPLANT
NEEDLE HYPO 18GX1.5 SHARP (NEEDLE)
NEEDLE HYPO 25X1 1.5 SAFETY (NEEDLE) IMPLANT
NS IRRIG 1000ML POUR BTL (IV SOLUTION) ×3 IMPLANT
PACK BASIN DAY SURGERY FS (CUSTOM PROCEDURE TRAY) ×3 IMPLANT
PAD CAST 4YDX4 CTTN HI CHSV (CAST SUPPLIES) ×2 IMPLANT
PADDING CAST COTTON 4X4 STRL (CAST SUPPLIES) ×2
PENCIL SMOKE EVACUATOR (MISCELLANEOUS) ×3 IMPLANT
SANITIZER HAND PURELL 535ML FO (MISCELLANEOUS) ×3 IMPLANT
SHEET MEDIUM DRAPE 40X70 STRL (DRAPES) ×3 IMPLANT
SLEEVE SCD COMPRESS KNEE MED (STOCKING) ×3 IMPLANT
SPONGE T-LAP 18X18 ~~LOC~~+RFID (SPONGE) ×3 IMPLANT
STOCKINETTE 6  STRL (DRAPES) ×2
STOCKINETTE 6 STRL (DRAPES) ×2 IMPLANT
SUCTION FRAZIER HANDLE 10FR (MISCELLANEOUS)
SUCTION TUBE FRAZIER 10FR DISP (MISCELLANEOUS) IMPLANT
SUT ETHILON 2 0 FS 18 (SUTURE) IMPLANT
SUT ETHILON 2 0 FSLX (SUTURE) IMPLANT
SUT ETHILON 3 0 PS 1 (SUTURE) IMPLANT
SUT MNCRL AB 3-0 PS2 18 (SUTURE) IMPLANT
SWAB COLLECTION DEVICE MRSA (MISCELLANEOUS) IMPLANT
SWAB CULTURE ESWAB REG 1ML (MISCELLANEOUS) IMPLANT
SYR BULB EAR ULCER 3OZ GRN STR (SYRINGE) ×3 IMPLANT
SYR CONTROL 10ML LL (SYRINGE) IMPLANT
TOWEL GREEN STERILE FF (TOWEL DISPOSABLE) ×3 IMPLANT
TUBE CONNECTING 20X1/4 (TUBING) IMPLANT
UNDERPAD 30X36 HEAVY ABSORB (UNDERPADS AND DIAPERS) ×3 IMPLANT

## 2021-06-02 NOTE — Anesthesia Postprocedure Evaluation (Signed)
Anesthesia Post Note  Patient: Samantha Mcgrath  Procedure(s) Performed: Left 2nd toe amputation (Left: Toe)     Patient location during evaluation: PACU Anesthesia Type: MAC Level of consciousness: awake and alert Pain management: pain level controlled Vital Signs Assessment: post-procedure vital signs reviewed and stable Respiratory status: spontaneous breathing Cardiovascular status: stable Anesthetic complications: no   No notable events documented.  Last Vitals:  Vitals:   06/02/21 0815 06/02/21 0839  BP: 125/69 126/64  Pulse: 65 68  Resp: 12 16  Temp:  36.6 C  SpO2: 97% 96%    Last Pain:  Vitals:   06/02/21 0839  TempSrc:   PainSc: 0-No pain                 Nolon Nations

## 2021-06-02 NOTE — H&P (Signed)
Samantha Mcgrath is an 70 y.o. female.   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  She has failed nonoperative treatment to date including activity and shoewear modification as well as oral anti-inflammatories.  She presents today for surgical treatment.  We have discussed the risks and benefits of joint debridement and arthrodesis versus amputation of the toe.  She prefers amputation as the simpler and more predictable option.  Past Medical History:  Diagnosis Date   Asthma    Bronchitis    Cataract    bilateral   Depression    Diabetes mellitus without complication (HCC)    GERD (gastroesophageal reflux disease)    Gout    Hyperlipidemia    Hypertension    Pneumonia    Restrictive airway disease    Retinal detachment    left    Past Surgical History:  Procedure Laterality Date   AMPUTATION TOE Right 10/06/2020   Procedure: AMPUTATION 2ND TOE;  Surgeon: Landis Martins, DPM;  Location: [facility];  Service: Podiatry;  Laterality: Right;   ANKLE SURGERY     BLADDER SURGERY     EYE SURGERY     MELANOMA EXCISION     Of Right Leg   TONSILLECTOMY     TUBAL LIGATION      History reviewed. No pertinent family history. Social History:  reports that she has never smoked. She has never used smokeless tobacco. She reports current alcohol use. She reports that she does not use drugs.  Allergies:  Allergies  Allergen Reactions   Procaine Other (See Comments) and Palpitations    ALSO BLISTERS WITH TOPICAL Novacaine   Novocain. ALSO BLISTERS WITH TOPICAL    Medications Prior to Admission  Medication Sig Dispense Refill   acetaminophen (TYLENOL) 325 MG tablet Take 2 tablets (650 mg total) by mouth every 6 (six) hours as needed for moderate pain.     allopurinol (ZYLOPRIM) 100 MG tablet TAKE 1 TABLET BY MOUTH EVERY DAY  30 tablet 0   atorvastatin (LIPITOR) 10 MG tablet Take 10 mg by mouth daily.     Cholecalciferol (VITAMIN D3) 250 MCG (10000 UT) capsule Take 10,000 Units by mouth daily.     diclofenac Sodium (VOLTAREN) 1 % GEL Apply 2 g topically 4 (four) times daily. Apply to left knee 2 g 0   Liraglutide (VICTOZA [place]) Inject into the skin.     losartan (COZAAR) 25 MG tablet TAKE 1 TABLET (25 MG TOTAL) BY MOUTH DAILY. 90 tablet 0   metFORMIN (GLUCOPHAGE-XR) 500 MG 24 hr tablet Take 1,000 mg by mouth 2 (two) times daily.     montelukast (SINGULAIR) 10 MG tablet Take 10 mg by mouth at bedtime.     pantoprazole (PROTONIX) 40 MG tablet Take 40 mg by mouth 2 (two) times daily.     chlorthalidone (HYGROTON) 50 MG tablet Take 50 mg by mouth daily.     tiZANidine (ZANAFLEX) 4 MG tablet Take 1 tablet (4 mg total) by mouth daily as needed. (Patient taking differently: Take 4 mg by mouth daily as needed for muscle spasms.) 30 tablet 1    Results for orders placed or performed during the hospital encounter of 06/02/21 (from the past 48 hour(s))  Glucose, capillary     Status: Abnormal   Collection Time: 06/02/21  7:04 AM  Result Value Ref Range   Glucose-Capillary 155 (H) 70 - 99 mg/dL    Comment: Glucose reference range applies only to samples taken after fasting for at least 8 hours.   DG MINI C-ARM IMAGE ONLY  Result Date: 06/09/2021 There is no interpretation for this exam.  This order is for images obtained during a surgical procedure.  Please See "Surgeries" Tab for more information regarding the procedure.    Review of Systems no recent fever, chills, nausea, vomiting or changes in her appetite  Blood pressure (!) 152/76, pulse 64, temperature 97.8 F (36.6 C), temperature source Oral, resp. rate 18, height 5\' 6"  (1.676 m), weight 102.7 kg, SpO2 100 %. Physical Exam  Well-nourished well-developed woman in no apparent distress.  Alert and oriented x4.  Normal mood and affect.  Gait is normal.  The left foot  second toe has a prominent gouty tophus over the DIP joint.  No signs of infection.  The toe is tender to palpation in this area.  Intact sensibility to light touch dorsally and plantarly at the forefoot.   Assessment/Plan Painful left second toe gouty tophus -to the operating room today for second toe amputation through the middle phalanx.  The risks and benefits of the alternative treatment options have been discussed in detail.  The patient wishes to proceed with surgery and specifically understands risks of bleeding, infection, nerve damage, blood clots, need for additional surgery, amputation and death.   Wylene Simmer, MD 06/09/21, 7:14 AM

## 2021-06-02 NOTE — Transfer of Care (Signed)
Immediate Anesthesia Transfer of Care Note  Patient: Samantha Mcgrath  Procedure(s) Performed: Left 2nd toe amputation (Left: Toe)  Patient Location: PACU  Anesthesia Type:MAC  Level of Consciousness: awake, alert , oriented and patient cooperative  Airway & Oxygen Therapy: Patient Spontanous Breathing and Patient connected to face mask oxygen  Post-op Assessment: Report given to RN and Post -op Vital signs reviewed and stable  Post vital signs: Reviewed and stable  Last Vitals:  Vitals Value Taken Time  BP    Temp    Pulse    Resp    SpO2      Last Pain:  Vitals:   06/02/21 0649  TempSrc: Oral  PainSc: 0-No pain      Patients Stated Pain Goal: 8 (24/81/85 9093)  Complications: No notable events documented.

## 2021-06-02 NOTE — Discharge Instructions (Addendum)
Samantha Simmer, MD [facility]  Please read the following information regarding your care after surgery.  Medications  You only need a prescription for the narcotic pain medicine (ex. oxycodone, Percocet, Norco).  All of the other medicines listed below are available over the counter. ? Aleve 2 pills twice a day for the first 3 days after surgery. ? acetominophen (Tylenol) 650 mg every 4-6 hours as you need for minor to moderate pain ? oxycodone as prescribed for severe pain  Narcotic pain medicine (ex. oxycodone, Percocet, Vicodin) will cause constipation.  To prevent this problem, take the following medicines while you are taking any pain medicine. ? docusate sodium (Colace) 100 mg twice a day ? senna (Senokot) 2 tablets twice a day   Weight Bearing ? Bear weight only on your operated foot in the post-op shoe.   Cast / Splint / Dressing ? Keep your splint, cast or dressing clean and dry.  Dont put anything (coat hanger, pencil, etc) down inside of it.  If it gets damp, use a hair dryer on the cool setting to dry it.  If it gets soaked, call the office to schedule an appointment for a cast change.   After your dressing, cast or splint is removed; you may shower, but do not soak or scrub the wound.  Allow the water to run over it, and then gently pat it dry.  Swelling It is normal for you to have swelling where you had surgery.  To reduce swelling and pain, keep your toes above your nose for at least 3 days after surgery.  It may be necessary to keep your foot or leg elevated for several weeks.  If it hurts, it should be elevated.  Follow Up Call my office at [phone] when you are discharged from the hospital or surgery center to schedule an appointment to be seen two weeks after surgery.  Call my office at [phone] if you develop a fever >101.5 F, nausea, vomiting, bleeding from the surgical site or severe pain.     Post Anesthesia Home Care Instructions  Activity: Get  plenty of rest for the remainder of the day. A responsible individual must stay with you for 24 hours following the procedure.  For the next 24 hours, DO NOT: -Drive a car -Paediatric nurse -Drink alcoholic beverages -Take any medication unless instructed by your physician -Make any legal decisions or sign important papers.  Meals: Start with liquid foods such as gelatin or soup. Progress to regular foods as tolerated. Avoid greasy, spicy, heavy foods. If nausea and/or vomiting occur, drink only clear liquids until the nausea and/or vomiting subsides. Call your physician if vomiting continues.  Special Instructions/Symptoms: Your throat may feel dry or sore from the anesthesia or the breathing tube placed in your throat during surgery. If this causes discomfort, gargle with warm salt water. The discomfort should disappear within 24 hours.  If you had a scopolamine patch placed behind your ear for the management of post- operative nausea and/or vomiting:  1. The medication in the patch is effective for 72 hours, after which it should be removed.  Wrap patch in a tissue and discard in the trash. Wash hands thoroughly with soap and water. 2. You may remove the patch earlier than 72 hours if you experience unpleasant side effects which may include dry mouth, dizziness or visual disturbances. 3. Avoid touching the patch. Wash your hands with soap and water after contact with the patch.

## 2021-06-02 NOTE — Op Note (Addendum)
06/02/2021  8:02 AM  PATIENT:  Samantha Mcgrath  70 y.o. female  PRE-OPERATIVE DIAGNOSIS:  Left 2nd toe chronic gouty arthritis  POST-OPERATIVE DIAGNOSIS:  Left 2nd toe chronic gouty arthritis  Procedure(s): Left 2nd toe amputation through the middle phalanx  SURGEON:  Wylene Simmer, MD  ASSISTANT: Mechele Claude, PA-C  ANESTHESIA:   Local, MAC  EBL:  minimal   TOURNIQUET:  < 10 min with ankle esmarch  COMPLICATIONS:  None apparent  DISPOSITION:  Extubated, awake and stable to recovery.  INDICATION FOR PROCEDURE: The patient is a 70 year old female with past medical history significant for diabetes and gout.  She has a chronic painful tophus at the left foot second toe DIP joint.  She has failed nonoperative treatment to date and presents today for surgical treatment.  We discussed the risks and benefits of joint debridement and arthrodesis versus amputation.  She elects amputation of the left foot second toe through the middle phalanx.  The risks and benefits of the alternative treatment options have been discussed in detail.  The patient wishes to proceed with surgery and specifically understands risks of bleeding, infection, nerve damage, blood clots, need for additional surgery, amputation and death.    PROCEDURE IN DETAIL: After preoperative consent was obtained and the correct operative site was identified, the patient was brought the operating room placed supine on the operating table.  IV sedation was administered followed by preoperative antibiotics.  A surgical timeout was taken.  A metatarsal block was performed with half percent Marcaine with epinephrine.  The left lower extremity was then prepped and draped in standard sterile fashion.  Foot was exsanguinated and a 4 inch [place] tourniquet wrapped around the ankle.  [place] incision was made around the toe at the level of the DIP joint.  Subperiosteal dissection was then carried along the middle phalanx.  The bone cutter was  used to cut through the middle phalanx and the distal portion of the toe was passed off the field.  The cut surfaces of bone were smoothed.  The wound was irrigated copiously.  Neurovascular bundles were cauterized.  Incision was then closed with horizontal mattress sutures of 3-0 nylon.  Sterile dressings were applied followed by a compression wrap.  The tourniquet was released after application of the dressings.  The patient was awakened from anesthesia and transported to the recovery room in stable condition.   FOLLOW UP PLAN: Weightbearing as tolerated in a flat postop shoe.  Follow-up in the office in 2 weeks for suture removal.  No indication for DVT prophylaxis in this ambulatory patient.    Mechele Claude PA-C was present and scrubbed for the duration of the operative case. His assistance was essential in positioning the patient, prepping and draping, gaining and maintaining exposure, performing the operation, closing and dressing the wounds and applying the splint.

## 2021-06-02 NOTE — Anesthesia Procedure Notes (Signed)
Procedure Name: MAC Date/Time: 06/02/2021 7:49 AM Performed by: Signe Colt, CRNA Pre-anesthesia Checklist: Patient identified, Emergency Drugs available, Suction available, Timeout performed and Patient being monitored Patient Re-evaluated:Patient Re-evaluated prior to induction Oxygen Delivery Method: Simple face mask

## 2021-06-07 ENCOUNTER — Encounter (HOSPITAL_BASED_OUTPATIENT_CLINIC_OR_DEPARTMENT_OTHER): Payer: Self-pay | Admitting: Orthopedic Surgery

## 2021-06-15 DIAGNOSIS — Z4789 Encounter for other orthopedic aftercare: Secondary | ICD-10-CM | POA: Diagnosis not present

## 2021-06-20 DIAGNOSIS — U071 COVID-19: Secondary | ICD-10-CM | POA: Diagnosis not present

## 2021-06-27 DIAGNOSIS — M1A0721 Idiopathic chronic gout, left ankle and foot, with tophus (tophi): Secondary | ICD-10-CM | POA: Diagnosis not present

## 2021-08-10 DIAGNOSIS — R350 Frequency of micturition: Secondary | ICD-10-CM | POA: Diagnosis not present

## 2021-08-10 DIAGNOSIS — M109 Gout, unspecified: Secondary | ICD-10-CM | POA: Diagnosis not present

## 2021-08-12 DIAGNOSIS — M109 Gout, unspecified: Secondary | ICD-10-CM | POA: Diagnosis not present

## 2021-08-26 DIAGNOSIS — M79672 Pain in left foot: Secondary | ICD-10-CM | POA: Diagnosis not present

## 2021-08-26 DIAGNOSIS — M109 Gout, unspecified: Secondary | ICD-10-CM | POA: Diagnosis not present

## 2021-08-26 DIAGNOSIS — I10 Essential (primary) hypertension: Secondary | ICD-10-CM | POA: Diagnosis not present

## 2021-08-26 DIAGNOSIS — M2550 Pain in unspecified joint: Secondary | ICD-10-CM | POA: Diagnosis not present

## 2021-08-26 DIAGNOSIS — M1A0790 Idiopathic chronic gout, unspecified ankle and foot, without tophus (tophi): Secondary | ICD-10-CM | POA: Diagnosis not present

## 2021-09-05 DIAGNOSIS — M1A9XX1 Chronic gout, unspecified, with tophus (tophi): Secondary | ICD-10-CM | POA: Diagnosis not present

## 2021-09-05 DIAGNOSIS — E1169 Type 2 diabetes mellitus with other specified complication: Secondary | ICD-10-CM | POA: Diagnosis not present

## 2021-09-05 DIAGNOSIS — I10 Essential (primary) hypertension: Secondary | ICD-10-CM | POA: Diagnosis not present

## 2021-09-05 DIAGNOSIS — E7800 Pure hypercholesterolemia, unspecified: Secondary | ICD-10-CM | POA: Diagnosis not present

## 2021-09-06 DIAGNOSIS — M2550 Pain in unspecified joint: Secondary | ICD-10-CM | POA: Diagnosis not present

## 2021-09-06 DIAGNOSIS — E1169 Type 2 diabetes mellitus with other specified complication: Secondary | ICD-10-CM | POA: Diagnosis not present

## 2021-09-06 DIAGNOSIS — I10 Essential (primary) hypertension: Secondary | ICD-10-CM | POA: Diagnosis not present

## 2021-09-06 DIAGNOSIS — R69 Illness, unspecified: Secondary | ICD-10-CM | POA: Diagnosis not present

## 2021-09-06 DIAGNOSIS — E7800 Pure hypercholesterolemia, unspecified: Secondary | ICD-10-CM | POA: Diagnosis not present

## 2021-09-06 DIAGNOSIS — Z7985 Long-term (current) use of injectable non-insulin antidiabetic drugs: Secondary | ICD-10-CM | POA: Diagnosis not present

## 2021-09-06 DIAGNOSIS — Z7984 Long term (current) use of oral hypoglycemic drugs: Secondary | ICD-10-CM | POA: Diagnosis not present

## 2021-09-06 DIAGNOSIS — M1A9XX1 Chronic gout, unspecified, with tophus (tophi): Secondary | ICD-10-CM | POA: Diagnosis not present

## 2021-09-06 DIAGNOSIS — R944 Abnormal results of kidney function studies: Secondary | ICD-10-CM | POA: Diagnosis not present

## 2021-09-21 DIAGNOSIS — M7989 Other specified soft tissue disorders: Secondary | ICD-10-CM | POA: Diagnosis not present

## 2021-09-21 DIAGNOSIS — M79676 Pain in unspecified toe(s): Secondary | ICD-10-CM | POA: Diagnosis not present

## 2021-09-21 DIAGNOSIS — M1990 Unspecified osteoarthritis, unspecified site: Secondary | ICD-10-CM | POA: Diagnosis not present

## 2021-09-21 DIAGNOSIS — M2550 Pain in unspecified joint: Secondary | ICD-10-CM | POA: Diagnosis not present

## 2021-09-21 DIAGNOSIS — M79672 Pain in left foot: Secondary | ICD-10-CM | POA: Diagnosis not present

## 2021-09-21 DIAGNOSIS — Z79899 Other long term (current) drug therapy: Secondary | ICD-10-CM | POA: Diagnosis not present

## 2021-09-21 DIAGNOSIS — M79671 Pain in right foot: Secondary | ICD-10-CM | POA: Diagnosis not present

## 2021-09-21 DIAGNOSIS — M79641 Pain in right hand: Secondary | ICD-10-CM | POA: Diagnosis not present

## 2021-09-21 DIAGNOSIS — M79642 Pain in left hand: Secondary | ICD-10-CM | POA: Diagnosis not present

## 2021-09-21 DIAGNOSIS — M109 Gout, unspecified: Secondary | ICD-10-CM | POA: Diagnosis not present

## 2021-09-21 DIAGNOSIS — N1831 Chronic kidney disease, stage 3a: Secondary | ICD-10-CM | POA: Diagnosis not present

## 2021-10-11 DIAGNOSIS — L821 Other seborrheic keratosis: Secondary | ICD-10-CM | POA: Diagnosis not present

## 2021-10-11 DIAGNOSIS — D485 Neoplasm of uncertain behavior of skin: Secondary | ICD-10-CM | POA: Diagnosis not present

## 2021-10-11 DIAGNOSIS — Z85828 Personal history of other malignant neoplasm of skin: Secondary | ICD-10-CM | POA: Diagnosis not present

## 2021-10-11 DIAGNOSIS — L814 Other melanin hyperpigmentation: Secondary | ICD-10-CM | POA: Diagnosis not present

## 2021-10-11 DIAGNOSIS — Z08 Encounter for follow-up examination after completed treatment for malignant neoplasm: Secondary | ICD-10-CM | POA: Diagnosis not present

## 2021-10-11 DIAGNOSIS — D2272 Melanocytic nevi of left lower limb, including hip: Secondary | ICD-10-CM | POA: Diagnosis not present

## 2021-10-11 DIAGNOSIS — Z7189 Other specified counseling: Secondary | ICD-10-CM | POA: Diagnosis not present

## 2021-10-11 DIAGNOSIS — Z86006 Personal history of melanoma in-situ: Secondary | ICD-10-CM | POA: Diagnosis not present

## 2021-10-11 DIAGNOSIS — D225 Melanocytic nevi of trunk: Secondary | ICD-10-CM | POA: Diagnosis not present

## 2021-10-19 DIAGNOSIS — M79676 Pain in unspecified toe(s): Secondary | ICD-10-CM | POA: Diagnosis not present

## 2021-10-19 DIAGNOSIS — N1831 Chronic kidney disease, stage 3a: Secondary | ICD-10-CM | POA: Diagnosis not present

## 2021-10-19 DIAGNOSIS — M459 Ankylosing spondylitis of unspecified sites in spine: Secondary | ICD-10-CM | POA: Diagnosis not present

## 2021-10-19 DIAGNOSIS — M109 Gout, unspecified: Secondary | ICD-10-CM | POA: Diagnosis not present

## 2021-10-19 DIAGNOSIS — Z79899 Other long term (current) drug therapy: Secondary | ICD-10-CM | POA: Diagnosis not present

## 2021-10-19 DIAGNOSIS — M549 Dorsalgia, unspecified: Secondary | ICD-10-CM | POA: Diagnosis not present

## 2021-10-19 DIAGNOSIS — M1990 Unspecified osteoarthritis, unspecified site: Secondary | ICD-10-CM | POA: Diagnosis not present

## 2021-10-19 DIAGNOSIS — M7989 Other specified soft tissue disorders: Secondary | ICD-10-CM | POA: Diagnosis not present

## 2021-10-19 DIAGNOSIS — M2550 Pain in unspecified joint: Secondary | ICD-10-CM | POA: Diagnosis not present

## 2021-11-22 DIAGNOSIS — I10 Essential (primary) hypertension: Secondary | ICD-10-CM | POA: Diagnosis not present

## 2021-11-22 DIAGNOSIS — M459 Ankylosing spondylitis of unspecified sites in spine: Secondary | ICD-10-CM | POA: Diagnosis not present

## 2021-11-22 DIAGNOSIS — J4521 Mild intermittent asthma with (acute) exacerbation: Secondary | ICD-10-CM | POA: Diagnosis not present

## 2021-11-22 DIAGNOSIS — E7800 Pure hypercholesterolemia, unspecified: Secondary | ICD-10-CM | POA: Diagnosis not present

## 2021-11-22 DIAGNOSIS — J40 Bronchitis, not specified as acute or chronic: Secondary | ICD-10-CM | POA: Diagnosis not present

## 2021-11-22 DIAGNOSIS — R69 Illness, unspecified: Secondary | ICD-10-CM | POA: Diagnosis not present

## 2021-11-22 DIAGNOSIS — E1169 Type 2 diabetes mellitus with other specified complication: Secondary | ICD-10-CM | POA: Diagnosis not present

## 2022-01-17 DIAGNOSIS — I10 Essential (primary) hypertension: Secondary | ICD-10-CM | POA: Diagnosis not present

## 2022-01-17 DIAGNOSIS — E7800 Pure hypercholesterolemia, unspecified: Secondary | ICD-10-CM | POA: Diagnosis not present

## 2022-01-17 DIAGNOSIS — N1831 Chronic kidney disease, stage 3a: Secondary | ICD-10-CM | POA: Diagnosis not present

## 2022-01-17 DIAGNOSIS — J4521 Mild intermittent asthma with (acute) exacerbation: Secondary | ICD-10-CM | POA: Diagnosis not present

## 2022-01-17 DIAGNOSIS — E1169 Type 2 diabetes mellitus with other specified complication: Secondary | ICD-10-CM | POA: Diagnosis not present

## 2022-01-30 DIAGNOSIS — J029 Acute pharyngitis, unspecified: Secondary | ICD-10-CM | POA: Diagnosis not present

## 2022-02-08 ENCOUNTER — Other Ambulatory Visit: Payer: Self-pay | Admitting: Family Medicine

## 2022-02-08 ENCOUNTER — Other Ambulatory Visit: Payer: Self-pay | Admitting: Orthopedic Surgery

## 2022-02-08 DIAGNOSIS — Z1231 Encounter for screening mammogram for malignant neoplasm of breast: Secondary | ICD-10-CM

## 2022-02-15 DIAGNOSIS — E785 Hyperlipidemia, unspecified: Secondary | ICD-10-CM | POA: Diagnosis not present

## 2022-02-15 DIAGNOSIS — Z7984 Long term (current) use of oral hypoglycemic drugs: Secondary | ICD-10-CM | POA: Diagnosis not present

## 2022-02-15 DIAGNOSIS — E119 Type 2 diabetes mellitus without complications: Secondary | ICD-10-CM | POA: Diagnosis not present

## 2022-02-15 DIAGNOSIS — K219 Gastro-esophageal reflux disease without esophagitis: Secondary | ICD-10-CM | POA: Diagnosis not present

## 2022-02-15 DIAGNOSIS — E669 Obesity, unspecified: Secondary | ICD-10-CM | POA: Diagnosis not present

## 2022-02-15 DIAGNOSIS — M109 Gout, unspecified: Secondary | ICD-10-CM | POA: Diagnosis not present

## 2022-02-15 DIAGNOSIS — I10 Essential (primary) hypertension: Secondary | ICD-10-CM | POA: Diagnosis not present

## 2022-02-15 DIAGNOSIS — M1990 Unspecified osteoarthritis, unspecified site: Secondary | ICD-10-CM | POA: Diagnosis not present

## 2022-02-15 DIAGNOSIS — J45909 Unspecified asthma, uncomplicated: Secondary | ICD-10-CM | POA: Diagnosis not present

## 2022-02-15 DIAGNOSIS — F419 Anxiety disorder, unspecified: Secondary | ICD-10-CM | POA: Diagnosis not present

## 2022-02-15 DIAGNOSIS — Z6833 Body mass index (BMI) 33.0-33.9, adult: Secondary | ICD-10-CM | POA: Diagnosis not present

## 2022-02-15 DIAGNOSIS — Z008 Encounter for other general examination: Secondary | ICD-10-CM | POA: Diagnosis not present

## 2022-02-15 DIAGNOSIS — R69 Illness, unspecified: Secondary | ICD-10-CM | POA: Diagnosis not present

## 2022-02-17 DIAGNOSIS — H26493 Other secondary cataract, bilateral: Secondary | ICD-10-CM | POA: Diagnosis not present

## 2022-02-17 DIAGNOSIS — E119 Type 2 diabetes mellitus without complications: Secondary | ICD-10-CM | POA: Diagnosis not present

## 2022-02-17 DIAGNOSIS — H35372 Puckering of macula, left eye: Secondary | ICD-10-CM | POA: Diagnosis not present

## 2022-02-17 DIAGNOSIS — H524 Presbyopia: Secondary | ICD-10-CM | POA: Diagnosis not present

## 2022-02-17 DIAGNOSIS — H40003 Preglaucoma, unspecified, bilateral: Secondary | ICD-10-CM | POA: Diagnosis not present

## 2022-03-07 DIAGNOSIS — Z86010 Personal history of colonic polyps: Secondary | ICD-10-CM | POA: Diagnosis not present

## 2022-03-07 DIAGNOSIS — Z09 Encounter for follow-up examination after completed treatment for conditions other than malignant neoplasm: Secondary | ICD-10-CM | POA: Diagnosis not present

## 2022-03-07 DIAGNOSIS — D120 Benign neoplasm of cecum: Secondary | ICD-10-CM | POA: Diagnosis not present

## 2022-03-07 DIAGNOSIS — D123 Benign neoplasm of transverse colon: Secondary | ICD-10-CM | POA: Diagnosis not present

## 2022-03-09 ENCOUNTER — Ambulatory Visit
Admission: RE | Admit: 2022-03-09 | Discharge: 2022-03-09 | Disposition: A | Discharge status: Home / Self Care | Payer: Medicare HMO | Source: Ambulatory Visit | Attending: Family Medicine | Admitting: Family Medicine

## 2022-03-09 DIAGNOSIS — Z1231 Encounter for screening mammogram for malignant neoplasm of breast: Secondary | ICD-10-CM | POA: Diagnosis not present

## 2022-03-28 DIAGNOSIS — M1A9XX1 Chronic gout, unspecified, with tophus (tophi): Secondary | ICD-10-CM | POA: Diagnosis not present

## 2022-03-28 DIAGNOSIS — E1169 Type 2 diabetes mellitus with other specified complication: Secondary | ICD-10-CM | POA: Diagnosis not present

## 2022-03-28 DIAGNOSIS — R69 Illness, unspecified: Secondary | ICD-10-CM | POA: Diagnosis not present

## 2022-03-28 DIAGNOSIS — J4521 Mild intermittent asthma with (acute) exacerbation: Secondary | ICD-10-CM | POA: Diagnosis not present

## 2022-03-28 DIAGNOSIS — M8580 Other specified disorders of bone density and structure, unspecified site: Secondary | ICD-10-CM | POA: Diagnosis not present

## 2022-03-28 DIAGNOSIS — Z23 Encounter for immunization: Secondary | ICD-10-CM | POA: Diagnosis not present

## 2022-03-28 DIAGNOSIS — Z0000 Encounter for general adult medical examination without abnormal findings: Secondary | ICD-10-CM | POA: Diagnosis not present

## 2022-03-28 DIAGNOSIS — I10 Essential (primary) hypertension: Secondary | ICD-10-CM | POA: Diagnosis not present

## 2022-03-28 DIAGNOSIS — M85852 Other specified disorders of bone density and structure, left thigh: Secondary | ICD-10-CM | POA: Diagnosis not present

## 2022-03-28 DIAGNOSIS — E7800 Pure hypercholesterolemia, unspecified: Secondary | ICD-10-CM | POA: Diagnosis not present

## 2022-04-14 DIAGNOSIS — I10 Essential (primary) hypertension: Secondary | ICD-10-CM | POA: Diagnosis not present

## 2022-04-14 DIAGNOSIS — E1169 Type 2 diabetes mellitus with other specified complication: Secondary | ICD-10-CM | POA: Diagnosis not present

## 2022-04-14 DIAGNOSIS — E7800 Pure hypercholesterolemia, unspecified: Secondary | ICD-10-CM | POA: Diagnosis not present

## 2022-04-14 DIAGNOSIS — J4521 Mild intermittent asthma with (acute) exacerbation: Secondary | ICD-10-CM | POA: Diagnosis not present

## 2022-04-14 DIAGNOSIS — N1831 Chronic kidney disease, stage 3a: Secondary | ICD-10-CM | POA: Diagnosis not present

## 2022-04-23 NOTE — Progress Notes (Signed)
Office Visit Note  Patient: Samantha Mcgrath             Date of Birth: November 19, 1950           MRN: [id number]             PCP: Caren Macadam, MD Referring: Caren Macadam, MD Visit Date: 04/24/2022   Subjective:  New Patient (Initial Visit) (Transfer care, Dr. Buck Mam in [place])   History of Present Illness: Samantha Mcgrath is a 71 y.o. female here for a second opinion and transfer of care previously saw Dr. Kathlene November in June for worsening chronic back and joint pain newly diagnosed with ankylosing spondylitis. She was recommended to start treatment with Humira for axial disease but did not feel safe doing so.  Evaluation included x-ray imaging and lab test at thought to be consistent based on abnormal inflammatory markers and syndesmophytes on lumbar spine x-ray Back pain has been an ongoing problem for years but has been more severe or limiting within the past year or 2.  She experiences pain notably when trying to lie flat in bed but also when standing or walking for prolonged times.  Gets morning stiffness lasting about 1 hour on average also becomes very stiff again if seated position for more than a few minutes.  Notices initial improvement once up and moving but gets increased pain again if walking for too long.  Does awaken her from sleep some nights.  She does not have issues with numbness or radiation of pain into the legs does not experience weakness and had no falls.  She has tried taking ibuprofen 800 mg 3 times daily or naproxen 500 mg twice daily which have only small benefit in her symptoms. She has diabetes and previous amputation of 2nd toe on right foot with osteomyelitis. Also history of gout started years ago in her right great toe but did not require long term medication. Started to have inflammation again and now on allopurinol 250 mg daily. Was previously on colchicine for treatment but off this without ongoing prophylaxis at this time.  She denies any history of inflammatory  eye problems.  She experiences diarrhea occasionally no history of inflammatory bowel disease.  Labs reviewed ANA neg RF neg ESR 44 CRP 79  Activities of Daily Living:  Patient reports morning stiffness for 1 hour.   Patient Denies nocturnal pain.  Difficulty dressing/grooming: Denies Difficulty climbing stairs: Reports Difficulty getting out of chair: Denies Difficulty using hands for taps, buttons, cutlery, and/or writing: Denies  Review of Systems  Constitutional:  Negative for fatigue.  HENT:  Negative for mouth sores and mouth dryness.   Eyes:  Positive for dryness.  Respiratory:  Positive for shortness of breath.   Cardiovascular:  Negative for chest pain and palpitations.  Gastrointestinal:  Positive for diarrhea. Negative for blood in stool and constipation.  Endocrine: Negative for increased urination.  Genitourinary:  Positive for involuntary urination.  Musculoskeletal:  Positive for joint pain, gait problem, joint pain, joint swelling, myalgias, muscle weakness, morning stiffness, muscle tenderness and myalgias.  Skin:  Negative for color change, rash, hair loss and sensitivity to sunlight.  Allergic/Immunologic: Negative for susceptible to infections.  Neurological:  Negative for dizziness and headaches.  Hematological:  Negative for swollen glands.  Psychiatric/Behavioral:  Negative for depressed mood and sleep disturbance. The patient is not nervous/anxious.     PMFS History:  Patient Active Problem List   Diagnosis Date Noted   Ankylosing spondylitis ([place]) 04/24/2022  High risk medication use 04/24/2022   Chronic gouty arthritis 02/23/2021   Osteomyelitis due to type 2 diabetes mellitus ([place]) 10/04/2020   Osteomyelitis of ankle or foot, acute, right ([place]) 10/04/2020   Chronic cough 01/06/2020   Chronic renal failure, stage 3a ([place]) 10/08/2019   Primary insomnia 03/28/2019   Primary osteoarthritis of left knee 12/24/2018   Combined forms of age-related  cataract of right eye 08/15/2018   Gastroesophageal reflux disease without esophagitis 01/29/2018   Asthma 01/28/2018   Hyperlipidemia 01/28/2018   Type 2 diabetes mellitus without complication, with long-term current use of insulin ([place]) 01/28/2018   Retinal detachment of left eye with multiple breaks 11/14/2016   Morbid obesity ([place]) 08/17/2015   Postoperative wound dehiscence 08/17/2015   Ulcer of right leg, with fat layer exposed ([place]) 08/17/2015   Wound, surgical, infected, subsequent encounter 08/17/2015   Essential hypertension 06/01/2014    Past Medical History:  Diagnosis Date   Asthma    Bronchitis    Cataract    bilateral   Depression    Diabetes mellitus without complication ([place])    GERD (gastroesophageal reflux disease)    Gout    Hyperlipidemia    Hypertension    Pneumonia    Restrictive airway disease    Retinal detachment    left    History reviewed. No pertinent family history. Past Surgical History:  Procedure Laterality Date   AMPUTATION TOE Right 10/06/2020   Procedure: AMPUTATION 2ND TOE;  Surgeon: Landis Martins, DPM;  Location: [facility];  Service: Podiatry;  Laterality: Right;   AMPUTATION TOE Left 06/02/2021   Procedure: Left 2nd toe amputation;  Surgeon: Wylene Simmer, MD;  Location: [place];  Service: Orthopedics;  Laterality: Left;   ANKLE SURGERY     BLADDER SURGERY     EYE SURGERY     MELANOMA EXCISION     Of Right Leg   TONSILLECTOMY     TUBAL LIGATION     Social History   Social History Narrative   Diet: Blank      Do you drink/ eat things with caffeine? Yes      Marital status:  Divorced                             What year were you married ? 1969      Do you live in a house, apartment,assistred living, condo, trailer, etc.)? House      Is it one or more stories? One      How many persons live in your home ? 1      Do you have any pets in your home ?(please list) No      Highest Level of education completed:  PHD      Current or past profession: Clergy      Do you exercise?   No                           Type & how often       ADVANCED DIRECTIVES (Please bring copies)      Do you have a living will? Yes      Do you have a DNR form?   Yes                   If not, do you want to discuss one?       Do  you have signed POA?HPOA forms?  Yes               If so, please bring to your appointment      FUNCTIONAL STATUS- To be completed by Spouse / child / Staff       Do you have difficulty bathing or dressing yourself ? No      Do you have difficulty preparing food or eating ? No      Do you have difficulty managing your mediation ? No      Do you have difficulty managing your finances ? No      Do you have difficulty affording your medication ? No      Immunization History  Administered Date(s) Administered   Influenza Split 03/10/2011, 03/28/2012, 03/03/2014, 04/02/2015   Influenza, High Dose Seasonal PF 03/28/2019, 03/17/2020   Influenza,inj,Quad PF,6-35 Mos 03/19/2018   Influenza,trivalent, recombinat, inj, PF 03/03/2014   Influenza-Unspecified 03/10/2011, 03/28/2012   Moderna Sars-Covid-2 Vaccination 07/01/2019, 07/29/2019, 04/12/2020   Tdap 10/28/2014     Objective: Vital Signs: BP 132/73 (BP Location: Right Arm, Patient Position: Sitting, Cuff Size: Large)   Pulse 78   Resp 16   Ht 5' 5.5" (1.664 m)   Wt 200 lb (90.7 kg)   BMI 32.78 kg/m    Physical Exam Constitutional:      Appearance: She is obese.  Cardiovascular:     Rate and Rhythm: Normal rate and regular rhythm.  Pulmonary:     Effort: Pulmonary effort is normal.     Breath sounds: Normal breath sounds.  Skin:    General: Skin is warm and dry.  Neurological:     Mental Status: She is alert.      Musculoskeletal Exam:  Neck full ROM no tenderness Shoulders full ROM no tenderness or swelling Elbows full ROM no tenderness or swelling Wrists full ROM no tenderness or swelling Fingers full ROM no  tenderness or swelling Upper back paraspinal muscle tenderness no palpable abnormality Low back midline tenderness and over sacrum near midline, no radiation Lateral hip tenderness b/l Knees full ROM pain with full extension, crepitus b/l, tenderness to pressure over patellar tendon on left, small effusions present Ankles stiff warmth and swelling present on lateral ankle worse on right   Investigation: No additional findings.  Imaging: No results found.  Recent Labs: Lab Results  Component Value Date   WBC 16.8 (H) 04/24/2022   HGB 11.2 (L) 04/24/2022   PLT 450 (H) 04/24/2022   NA 139 04/24/2022   K 5.3 04/24/2022   CL 103 04/24/2022   CO2 25 04/24/2022   GLUCOSE 123 (H) 04/24/2022   BUN 23 04/24/2022   CREATININE 0.88 04/24/2022   BILITOT 0.4 04/24/2022   ALKPHOS 66 10/05/2020   AST 13 04/24/2022   ALT 10 04/24/2022   PROT 6.7 04/24/2022   ALBUMIN 3.2 (L) 10/05/2020   CALCIUM 10.2 04/24/2022   QFTBGOLDPLUS NEGATIVE 04/24/2022    Speciality Comments: No specialty comments available.  Procedures:  No procedures performed Allergies: Procaine   Assessment / Plan:     Visit Diagnoses: Ankylosing spondylitis of multiple sites in spine ([place]) - Plan: predniSONE (DELTASONE) 5 MG tablet  On review of previous imaging and labs and prior rheumatology assessment presentation may be consistent with ankylosing spondylitis.  Considering her age and by my review somewhat nonspecific radiographic findings would maintain some concern for possible DISH in the differential diagnosis.  However this generally should not account for markedly abnormal blood  test for inflammation.  Discussed starting treatment with Humira for this we will also prescribe a short-term prednisone taper down from 20 mg daily because current symptoms are fairly limiting and will take a while to start new medication and see clinical improvement.  She has already tried taking nearly a maximal recommended dosing of  ibuprofen or Aleve without great symptom control.  High risk medication use - Plan: CBC with Differential/Platelet, COMPLETE METABOLIC PANEL WITH GFR, Hepatitis B core antibody, IgM, Hepatitis B surface antigen, Hepatitis C antibody, QuantiFERON-TB Gold Plus, HIV Antibody (routine testing w rflx)  Discussed starting Humira including risk of medication including infections, injection site reactions, long-term malignancy risk, and need for lab monitoring for TB cytopenias and metabolic panel.  Checking baseline labs today including CBC CMP hepatitis serology and QuantiFERON and HIV.  Orders: Orders Placed This Encounter  Procedures   CBC with Differential/Platelet   COMPLETE METABOLIC PANEL WITH GFR   Hepatitis B core antibody, IgM   Hepatitis B surface antigen   Hepatitis C antibody   QuantiFERON-TB Gold Plus   HIV Antibody (routine testing w rflx)   Meds ordered this encounter  Medications   predniSONE (DELTASONE) 5 MG tablet    Sig: Take 4 tablets (20 mg total) by mouth daily with breakfast for 3 days, THEN 3 tablets (15 mg total) daily with breakfast for 3 days, THEN 2 tablets (10 mg total) daily with breakfast for 3 days, THEN 1 tablet (5 mg total) daily with breakfast for 3 days.    Dispense:  30 tablet    Refill:  0    Follow-Up Instructions: Return in about 2 months (around 06/24/2022) for AS ADA start f/u 101mo.   CCollier Salina MD  Note - This record has been created using [employer]  Chart creation errors have been sought, but may not always  have been located. Such creation errors do not reflect on  the standard of medical care.

## 2022-04-24 ENCOUNTER — Telehealth: Payer: Self-pay | Admitting: Pharmacist

## 2022-04-24 ENCOUNTER — Encounter: Payer: Self-pay | Admitting: Internal Medicine

## 2022-04-24 ENCOUNTER — Ambulatory Visit: Payer: Medicare HMO | Attending: Internal Medicine | Admitting: Internal Medicine

## 2022-04-24 VITALS — BP 132/73 | HR 78 | Resp 16 | Ht 65.5 in | Wt 200.0 lb

## 2022-04-24 DIAGNOSIS — Z79899 Other long term (current) drug therapy: Secondary | ICD-10-CM

## 2022-04-24 DIAGNOSIS — M450 Ankylosing spondylitis of multiple sites in spine: Secondary | ICD-10-CM

## 2022-04-24 DIAGNOSIS — M459 Ankylosing spondylitis of unspecified sites in spine: Secondary | ICD-10-CM | POA: Insufficient documentation

## 2022-04-24 MED ORDER — PREDNISONE 5 MG PO TABS: ORAL_TABLET | ORAL | 0 refills | Status: AC

## 2022-04-24 NOTE — Telephone Encounter (Signed)
Please start HUMIRA '40mg'$  SQ BIV.  Dose: '40mg'$  SQ every 14 days  Dx: Ankylosing spondylitis (M45.0)  Previously tried therapies: naproxen Ibuprofen  Patient portion Of [employer] PAP application completed at [place] today. Provider portion placed in Dr. Marveen Reeks folder for signature  Knox Saliva, PharmD, MPH, BCPS, CPP Clinical Pharmacist (Rheumatology and Pulmonology)

## 2022-04-24 NOTE — Progress Notes (Signed)
Pharmacy Note Subjective: Patient presents today to [facility] for follow up office visit. Patient seen by the pharmacist for counseling on Humira for ankylosing spondylitis.  Prior therapy includes: ibuprofen 800 mg three times daily and naproxen 500 mg twice daily; these did not work to manage her AS.  Diagnosis of heart failure: No  Objective:  CBC    Component Value Date/Time   WBC 9.8 10/08/2020 0636   RBC 3.79 (L) 10/08/2020 0636   HGB 10.1 (L) 10/08/2020 0636   HCT 32.3 (L) 10/08/2020 0636   PLT 354 10/08/2020 0636   MCV 85.2 10/08/2020 0636   MCH 26.6 10/08/2020 0636   MCHC 31.3 10/08/2020 0636   RDW 15.6 (H) 10/08/2020 0636   LYMPHSABS 1.7 10/08/2020 0636   MONOABS 1.1 (H) 10/08/2020 0636   EOSABS 0.4 10/08/2020 0636   BASOSABS 0.1 10/08/2020 0636     CMP     Component Value Date/Time   NA 135 06/01/2021 1450   K 4.7 06/01/2021 1450   CL 101 06/01/2021 1450   CO2 26 06/01/2021 1450   GLUCOSE 140 (H) 06/01/2021 1450   BUN 16 06/01/2021 1450   CREATININE 0.87 06/01/2021 1450   CREATININE 0.98 (H) 06/29/2020 0000   CALCIUM 9.5 06/01/2021 1450   PROT 6.9 10/05/2020 0302   ALBUMIN 3.2 (L) 10/05/2020 0302   AST 17 10/05/2020 0302   ALT 14 10/05/2020 0302   ALKPHOS 66 10/05/2020 0302   BILITOT 0.9 10/05/2020 0302   GFRNONAA >60 06/01/2021 1450      Baseline Immunosuppressant Therapy Labs TB GOLD   Hepatitis Panel   HIV No results found for: "HIV" Immunoglobulins   SPEP    Latest Ref Rng & Units 10/05/2020    3:02 AM  Serum Protein Electrophoresis  Total Protein 6.5 - 8.1 g/dL 6.9    G6PD No results found for: "G6PDH" TPMT No results found for: "TPMT"   Chest x-ray: 10/21/2015 -- no acute cardiopulmonary disease  Assessment/Plan:  Counseled patient that Humira is a TNF blocking agent.  Counseled patient on purpose, proper use, and adverse effects of Humira.  Reviewed the most common adverse effects including infections, headache, and  injection site reactions. Discussed that there is the possibility of an increased risk of malignancy including non-melanoma skin cancer but it is not well understood if this increased risk is due to the medication or the disease state.  Advised patient to get yearly dermatology exams due to risk of skin cancer. Counseled patient that Humira should be held prior to scheduled surgery.  Counseled patient to avoid live vaccines while on Humira.  Recommend annual influenza, PCV 15 or PCV20 or Pneumovax 23, and Shingrix as indicated.  Reviewed the importance of regular labs while on Humira therapy. Will monitor CBC and CMP 1 month after starting and then every 3 months routinely thereafter. Will monitor TB gold annually. Standing orders placed. Provided patient with medication education material and answered all questions.  Patient consented to Humira.  Will upload consent into the media tab.  Reviewed storage instructions of Humira.  Advised initial injection must be administered in office.  Patient verbalized understanding.   Dermatology referral -- patient is already established with [facility] and has been advised to receive yearly skin checks while on Humira.  Dose will be for ankylosing spondylitis Humira 40 mg every 14 days.  Prescription pending lab results and insurance approval.  [place] [facility] PharmD Candidate [phone]

## 2022-04-25 NOTE — Telephone Encounter (Addendum)
SAVED a Prior Authorization request to [facility] for HUMIRA via CoverMyMeds. Submission is pending OV note from 04/24/22 to be signed  Key: [id number]  Signed provider portion of [employer] PAP received from Dr. Tristan Schroeder, PharmD, MPH, BCPS, CPP Clinical Pharmacist (Rheumatology and Pulmonology)

## 2022-04-30 LAB — COMPLETE METABOLIC PANEL WITH GFR
AG Ratio: 1.8 (calc) (ref 1.0–2.5)
ALT: 10 U/L (ref 6–29)
AST: 13 U/L (ref 10–35)
Albumin: 4.3 g/dL (ref 3.6–5.1)
Alkaline phosphatase (APISO): 79 U/L (ref 37–153)
BUN: 23 mg/dL (ref 7–25)
CO2: 25 mmol/L (ref 20–32)
Calcium: 10.2 mg/dL (ref 8.6–10.4)
Chloride: 103 mmol/L (ref 98–110)
Creat: 0.88 mg/dL (ref 0.60–1.00)
Globulin: 2.4 g/dL (calc) (ref 1.9–3.7)
Glucose, Bld: 123 mg/dL — ABNORMAL HIGH (ref 65–99)
Potassium: 5.3 mmol/L (ref 3.5–5.3)
Sodium: 139 mmol/L (ref 135–146)
Total Bilirubin: 0.4 mg/dL (ref 0.2–1.2)
Total Protein: 6.7 g/dL (ref 6.1–8.1)
eGFR: 70 mL/min/{1.73_m2} (ref >=60)

## 2022-04-30 LAB — QUANTIFERON-TB GOLD PLUS
Mitogen-NIL: 2.26 IU/mL
NIL: 0.04 IU/mL
QuantiFERON-TB Gold Plus: NEGATIVE
TB1-NIL: <0 IU/mL
TB2-NIL: <0 IU/mL

## 2022-04-30 LAB — CBC WITH DIFFERENTIAL/PLATELET
Absolute Monocytes: 958 cells/uL — ABNORMAL HIGH (ref 200–950)
Basophils Absolute: 118 cells/uL (ref 0–200)
Basophils Relative: 0.7 %
Eosinophils Absolute: 403 cells/uL (ref 15–500)
Eosinophils Relative: 2.4 %
HCT: 34.9 % — ABNORMAL LOW (ref 35.0–45.0)
Hemoglobin: 11.2 g/dL — ABNORMAL LOW (ref 11.7–15.5)
Lymphs Abs: 1109 cells/uL (ref 850–3900)
MCH: 27.4 pg (ref 27.0–33.0)
MCHC: 32.1 g/dL (ref 32.0–36.0)
MCV: 85.3 fL (ref 80.0–100.0)
MPV: 10.7 fL (ref 7.5–12.5)
Monocytes Relative: 5.7 %
Neutro Abs: 14213 cells/uL — ABNORMAL HIGH (ref 1500–7800)
Neutrophils Relative %: 84.6 %
Platelets: 450 10*3/uL — ABNORMAL HIGH (ref 140–400)
RBC: 4.09 10*6/uL (ref 3.80–5.10)
RDW: 15.1 % — ABNORMAL HIGH (ref 11.0–15.0)
Total Lymphocyte: 6.6 %
WBC: 16.8 10*3/uL — ABNORMAL HIGH (ref 3.8–10.8)

## 2022-04-30 LAB — HEPATITIS B SURFACE ANTIGEN: Hepatitis B Surface Ag: NONREACTIVE

## 2022-04-30 LAB — HEPATITIS B CORE ANTIBODY, IGM: Hep B C IgM: NONREACTIVE

## 2022-04-30 LAB — HIV ANTIBODY (ROUTINE TESTING W REFLEX): HIV 1&2 Ab, 4th Generation: NONREACTIVE

## 2022-04-30 LAB — HEPATITIS C ANTIBODY: Hepatitis C Ab: NONREACTIVE

## 2022-05-04 ENCOUNTER — Other Ambulatory Visit (HOSPITAL_COMMUNITY): Payer: Self-pay

## 2022-05-04 NOTE — Telephone Encounter (Signed)
Submitted PA request to [place] for Humira with clinicals  Key: [id number]  Per automated response: [facility] is not able to process this request through [facility], please contact the plan at [phone] or fax in request to [place]. They state authorization is already on file. They will fax approval letter  Copay for 28 day supply is $1928.92 per test claim. Will awaiting PA approval letter prior to submission of PAP application  Knox Saliva, PharmD, MPH, BCPS, CPP Clinical Pharmacist (Rheumatology and Pulmonology)

## 2022-05-04 NOTE — Telephone Encounter (Signed)
Submitted Patient Assistance Application to [place] for [place] along with provider portion, patient portion, PA approval letter, med list, and insurance card copy. Will update patient when we receive a response.  Fax# [phone] Phone# [phone]  Knox Saliva, PharmD, MPH, BCPS, CPP Clinical Pharmacist (Rheumatology and Pulmonology)

## 2022-05-11 DIAGNOSIS — D649 Anemia, unspecified: Secondary | ICD-10-CM | POA: Diagnosis not present

## 2022-05-11 DIAGNOSIS — E1169 Type 2 diabetes mellitus with other specified complication: Secondary | ICD-10-CM | POA: Diagnosis not present

## 2022-05-11 DIAGNOSIS — E7800 Pure hypercholesterolemia, unspecified: Secondary | ICD-10-CM | POA: Diagnosis not present

## 2022-05-11 DIAGNOSIS — I10 Essential (primary) hypertension: Secondary | ICD-10-CM | POA: Diagnosis not present

## 2022-05-18 DIAGNOSIS — R059 Cough, unspecified: Secondary | ICD-10-CM | POA: Diagnosis not present

## 2022-05-18 DIAGNOSIS — J209 Acute bronchitis, unspecified: Secondary | ICD-10-CM | POA: Diagnosis not present

## 2022-05-18 DIAGNOSIS — Z8709 Personal history of other diseases of the respiratory system: Secondary | ICD-10-CM | POA: Diagnosis not present

## 2022-06-08 NOTE — Telephone Encounter (Signed)
Called [employer] for update on patient's Humira PAP application. Received a verbal notification from  [place] regarding an approval for Samantha Mcgrath patient assistance from 05/15/2022 to 06/05/2023.  Requested approval letter be faxed to our clinic.  Apparently patient received first shipment on 06/01/2022  Fax# [phone] Phone# [phone]  ATC patient to schedule Humira new start visit. Unable to reach. Left VM requesting return call  Knox Saliva, PharmD, MPH, BCPS, CPP Clinical Pharmacist (Rheumatology and Pulmonology)

## 2022-06-09 MED ORDER — HUMIRA (2 PEN) 40 MG/0.4ML ~~LOC~~ AJKT: 40.0000 mg | AUTO-INJECTOR | SUBCUTANEOUS | 0 refills | Status: DC

## 2022-06-09 NOTE — Telephone Encounter (Signed)
Patient left VM stated. She states she started Humira on Wednesday 06/07/2022. Had no issues with it and no reactions. States she has used pen injector before and did fine with device. Added Humira to med list today (as no print). She has f/u on 06/26/2022  Closing encounter  Knox Saliva, PharmD, MPH, BCPS, CPP Clinical Pharmacist (Rheumatology and Pulmonology)

## 2022-06-25 NOTE — Progress Notes (Signed)
Office Visit Note  Patient: Samantha Mcgrath             Date of Birth: 1951-05-25           MRN: [id number]             PCP: Caren Macadam, MD Referring: Caren Macadam, MD Visit Date: 06/26/2022   Subjective:  Follow-up (Follow up to new start on Humira. Patient states she has had 2 doses. )   History of Present Illness: Samantha Mcgrath is a 72 y.o. female here for follow up for ankylosing spondylitis on Humira 40 mg  q14days. She felt an improvement with steroid course but is back to about the same amount of symptoms as before. She also notices some left thumb pain with the cold weather that is unusual for her. So far no difference since starting Humiea but only took 2 doses so far. No intolerance or problems either.   Previous HPI 04/24/22 Samantha Mcgrath is a 72 y.o. female here for a second opinion and transfer of care previously saw Dr. Kathlene November in June for worsening chronic back and joint pain newly diagnosed with ankylosing spondylitis. She was recommended to start treatment with Humira for axial disease but did not feel safe doing so.  Evaluation included x-ray imaging and lab test at thought to be consistent based on abnormal inflammatory markers and syndesmophytes on lumbar spine x-ray Back pain has been an ongoing problem for years but has been more severe or limiting within the past year or 2.  She experiences pain notably when trying to lie flat in bed but also when standing or walking for prolonged times.  Gets morning stiffness lasting about 1 hour on average also becomes very stiff again if seated position for more than a few minutes.  Notices initial improvement once up and moving but gets increased pain again if walking for too long.  Does awaken her from sleep some nights.  She does not have issues with numbness or radiation of pain into the legs does not experience weakness and had no falls.  She has tried taking ibuprofen 800 mg 3 times daily or naproxen 500 mg twice daily  which have only small benefit in her symptoms. She has diabetes and previous amputation of 2nd toe on right foot with osteomyelitis. Also history of gout started years ago in her right great toe but did not require long term medication. Started to have inflammation again and now on allopurinol 250 mg daily. Was previously on colchicine for treatment but off this without ongoing prophylaxis at this time.  She denies any history of inflammatory eye problems.  She experiences diarrhea occasionally no history of inflammatory bowel disease.   Labs reviewed ANA neg RF neg ESR 44 CRP 79   Review of Systems  Constitutional:  Positive for fatigue.  HENT:  Negative for mouth sores and mouth dryness.   Eyes:  Positive for dryness.  Respiratory:  Negative for shortness of breath.   Cardiovascular:  Negative for chest pain and palpitations.  Gastrointestinal:  Negative for blood in stool, constipation and diarrhea.  Endocrine: Positive for increased urination.  Genitourinary:  Negative for involuntary urination.  Musculoskeletal:  Positive for joint pain, joint pain and morning stiffness. Negative for gait problem, joint swelling, myalgias, muscle weakness, muscle tenderness and myalgias.  Skin:  Negative for color change, rash, hair loss and sensitivity to sunlight.  Allergic/Immunologic: Negative for susceptible to infections.  Neurological:  Negative for dizziness and  headaches.  Hematological:  Negative for swollen glands.  Psychiatric/Behavioral:  Negative for depressed mood and sleep disturbance. The patient is not nervous/anxious.     PMFS History:  Patient Active Problem List   Diagnosis Date Noted   Osteoarthritis of left thumb 06/26/2022   Ankylosing spondylitis ([place]) 04/24/2022   High risk medication use 04/24/2022   Chronic gouty arthritis 02/23/2021   Osteomyelitis due to type 2 diabetes mellitus ([place]) 10/04/2020   Osteomyelitis of ankle or foot, acute, right ([place]) 10/04/2020    Chronic cough 01/06/2020   Chronic renal failure, stage 3a ([place]) 10/08/2019   Primary insomnia 03/28/2019   Primary osteoarthritis of left knee 12/24/2018   Combined forms of age-related cataract of right eye 08/15/2018   Gastroesophageal reflux disease without esophagitis 01/29/2018   Asthma 01/28/2018   Hyperlipidemia 01/28/2018   Type 2 diabetes mellitus without complication, with long-term current use of insulin ([place]) 01/28/2018   Retinal detachment of left eye with multiple breaks 11/14/2016   Morbid obesity ([place]) 08/17/2015   Postoperative wound dehiscence 08/17/2015   Ulcer of right leg, with fat layer exposed ([place]) 08/17/2015   Wound, surgical, infected, subsequent encounter 08/17/2015   Essential hypertension 06/01/2014    Past Medical History:  Diagnosis Date   Asthma    Bronchitis    Cataract    bilateral   Depression    Diabetes mellitus without complication ([place])    GERD (gastroesophageal reflux disease)    Gout    Hyperlipidemia    Hypertension    Pneumonia    Restrictive airway disease    Retinal detachment    left    History reviewed. No pertinent family history. Past Surgical History:  Procedure Laterality Date   AMPUTATION TOE Right 10/06/2020   Procedure: AMPUTATION 2ND TOE;  Surgeon: Landis Martins, DPM;  Location: [facility];  Service: Podiatry;  Laterality: Right;   AMPUTATION TOE Left 06/02/2021   Procedure: Left 2nd toe amputation;  Surgeon: Wylene Simmer, MD;  Location: [facility];  Service: Orthopedics;  Laterality: Left;   ANKLE SURGERY     BLADDER SURGERY     EYE SURGERY     MELANOMA EXCISION     Of Right Leg   TONSILLECTOMY     TUBAL LIGATION     Social History   Social History Narrative   Diet: Blank      Do you drink/ eat things with caffeine? Yes      Marital status:  Divorced                             What year were you married ? 1969      Do you live in a house, apartment,assistred living, condo, trailer, etc.)?  House      Is it one or more stories? One      How many persons live in your home ? 1      Do you have any pets in your home ?(please list) No      Highest Level of education completed: PHD      Current or past profession: Clergy      Do you exercise?   No                           Type & how often       ADVANCED DIRECTIVES (Please bring copies)  Do you have a living will? Yes      Do you have a DNR form?   Yes                   If not, do you want to discuss one?       Do you have signed POA?HPOA forms?  Yes               If so, please bring to your appointment      FUNCTIONAL STATUS- To be completed by Spouse / child / Staff       Do you have difficulty bathing or dressing yourself ? No      Do you have difficulty preparing food or eating ? No      Do you have difficulty managing your mediation ? No      Do you have difficulty managing your finances ? No      Do you have difficulty affording your medication ? No      Immunization History  Administered Date(s) Administered   Influenza Split 03/10/2011, 03/28/2012, 03/03/2014, 04/02/2015   Influenza, High Dose Seasonal PF 03/28/2019, 03/17/2020   Influenza,inj,Quad PF,6-35 Mos 03/19/2018   Influenza,trivalent, recombinat, inj, PF 03/03/2014   Influenza-Unspecified 03/10/2011, 03/28/2012   Moderna Sars-Covid-2 Vaccination 07/01/2019, 07/29/2019, 04/12/2020   Tdap 10/28/2014     Objective: Vital Signs: BP (!) 154/79 (BP Location: Left Arm, Patient Position: Sitting, Cuff Size: Normal)   Pulse 65   Resp 12   Ht '5\' 5"'$  (1.651 m)   Wt 202 lb 9.6 oz (91.9 kg)   BMI 33.71 kg/m    Physical Exam Constitutional:      Appearance: She is obese.  Eyes:     Conjunctiva/sclera: Conjunctivae normal.  Cardiovascular:     Rate and Rhythm: Normal rate and regular rhythm.  Pulmonary:     Effort: Pulmonary effort is normal.     Breath sounds: Normal breath sounds.  Musculoskeletal:     Right lower leg: No edema.      Left lower leg: No edema.  Skin:    General: Skin is warm and dry.     Findings: No rash.  Neurological:     Mental Status: She is alert.  Psychiatric:        Mood and Affect: Mood normal.      Musculoskeletal Exam:  Neck full ROM no tenderness Shoulders full ROM no tenderness or swelling Elbows full ROM no tenderness or swelling Wrists full ROM no tenderness or swelling Fingers left 1st CMC joint squaring and MCP hyperextension, mild heberdon's nodes Tenderness to pressure over lumbar paraspinal muscles, SI joints, and lateral hips on both sides without radiation Knees full ROM no tenderness or swelling Ankles full ROM no tenderness or swelling   Investigation: No additional findings.  Imaging: No results found.  Recent Labs: Lab Results  Component Value Date   WBC 16.8 (H) 04/24/2022   HGB 11.2 (L) 04/24/2022   PLT 450 (H) 04/24/2022   NA 139 04/24/2022   K 5.3 04/24/2022   CL 103 04/24/2022   CO2 25 04/24/2022   GLUCOSE 123 (H) 04/24/2022   BUN 23 04/24/2022   CREATININE 0.88 04/24/2022   BILITOT 0.4 04/24/2022   ALKPHOS 66 10/05/2020   AST 13 04/24/2022   ALT 10 04/24/2022   PROT 6.7 04/24/2022   ALBUMIN 3.2 (L) 10/05/2020   CALCIUM 10.2 04/24/2022   QFTBGOLDPLUS NEGATIVE 04/24/2022    Speciality Comments: No specialty comments  available.  Procedures:  No procedures performed Allergies: Procaine   Assessment / Plan:     Visit Diagnoses: Ankylosing spondylitis of multiple sites in spine ([place]) - Plan: Sedimentation rate, C-reactive protein  No significant improvement in low back pain symptoms so far but is very early on treatment with the Humira.  Still having a lot of pain and stiffness mostly over the lumbar spine sacrum and bilateral hip areas.  Checking sedimentation rate and CRP for disease activity monitoring discussed urology may sometimes improve faster than obvious clinical response and symptoms.  Would recommend continuing the Humira 40 mg [place]  q14days at least 2 more months though for adequate trial of treatment.  High risk medication use - Plan: CBC with Differential/Platelet, COMPLETE METABOLIC PANEL WITH GFR  Checking CBC and CMP for medication monitoring on Humira.  She has not had any intolerance to the injections and no interval infections.  Osteoarthritis of left thumb  Increased hand pain in the left thumb appears to be from primary osteoarthritis.  She has disease worse at the first [facility] joint no inflammatory changes on exam today.  Orders: Orders Placed This Encounter  Procedures   Sedimentation rate   C-reactive protein   CBC with Differential/Platelet   COMPLETE METABOLIC PANEL WITH GFR   No orders of the defined types were placed in this encounter.    Follow-Up Instructions: Return in about 3 months (around 09/25/2022) for AS on ADA f/u ~11mo.   CCollier Salina MD  Note - This record has been created using [employer]  Chart creation errors have been sought, but may not always  have been located. Such creation errors do not reflect on  the standard of medical care.

## 2022-06-26 ENCOUNTER — Encounter: Payer: Self-pay | Admitting: Internal Medicine

## 2022-06-26 ENCOUNTER — Ambulatory Visit: Payer: Medicare HMO | Attending: Internal Medicine | Admitting: Internal Medicine

## 2022-06-26 VITALS — BP 154/79 | HR 65 | Resp 12 | Ht 65.0 in | Wt 202.6 lb

## 2022-06-26 DIAGNOSIS — M450 Ankylosing spondylitis of multiple sites in spine: Secondary | ICD-10-CM | POA: Diagnosis not present

## 2022-06-26 DIAGNOSIS — Z79899 Other long term (current) drug therapy: Secondary | ICD-10-CM

## 2022-06-26 DIAGNOSIS — M1812 Unilateral primary osteoarthritis of first carpometacarpal joint, left hand: Secondary | ICD-10-CM | POA: Diagnosis not present

## 2022-06-27 LAB — COMPLETE METABOLIC PANEL WITH GFR
AG Ratio: 1.4 (calc) (ref 1.0–2.5)
ALT: 9 U/L (ref 6–29)
AST: 15 U/L (ref 10–35)
Albumin: 3.9 g/dL (ref 3.6–5.1)
Alkaline phosphatase (APISO): 77 U/L (ref 37–153)
BUN/Creatinine Ratio: 25 (calc) — ABNORMAL HIGH (ref 6–22)
BUN: 26 mg/dL — ABNORMAL HIGH (ref 7–25)
CO2: 21 mmol/L (ref 20–32)
Calcium: 9.5 mg/dL (ref 8.6–10.4)
Chloride: 103 mmol/L (ref 98–110)
Creat: 1.05 mg/dL — ABNORMAL HIGH (ref 0.60–1.00)
Globulin: 2.7 g/dL (calc) (ref 1.9–3.7)
Glucose, Bld: 112 mg/dL — ABNORMAL HIGH (ref 65–99)
Potassium: 5.3 mmol/L (ref 3.5–5.3)
Sodium: 140 mmol/L (ref 135–146)
Total Bilirubin: 0.4 mg/dL (ref 0.2–1.2)
Total Protein: 6.6 g/dL (ref 6.1–8.1)
eGFR: 56 mL/min/{1.73_m2} — ABNORMAL LOW (ref >=60)

## 2022-06-27 LAB — CBC WITH DIFFERENTIAL/PLATELET
Absolute Monocytes: 665 cells/uL (ref 200–950)
Basophils Absolute: 91 cells/uL (ref 0–200)
Basophils Relative: 1.3 %
Eosinophils Absolute: 301 cells/uL (ref 15–500)
Eosinophils Relative: 4.3 %
HCT: 34.3 % — ABNORMAL LOW (ref 35.0–45.0)
Hemoglobin: 11 g/dL — ABNORMAL LOW (ref 11.7–15.5)
Lymphs Abs: 2387 cells/uL (ref 850–3900)
MCH: 27.7 pg (ref 27.0–33.0)
MCHC: 32.1 g/dL (ref 32.0–36.0)
MCV: 86.4 fL (ref 80.0–100.0)
MPV: 11.7 fL (ref 7.5–12.5)
Monocytes Relative: 9.5 %
Neutro Abs: 3556 cells/uL (ref 1500–7800)
Neutrophils Relative %: 50.8 %
Platelets: 412 10*3/uL — ABNORMAL HIGH (ref 140–400)
RBC: 3.97 10*6/uL (ref 3.80–5.10)
RDW: 14.9 % (ref 11.0–15.0)
Total Lymphocyte: 34.1 %
WBC: 7 10*3/uL (ref 3.8–10.8)

## 2022-06-27 LAB — C-REACTIVE PROTEIN: CRP: 3.2 mg/L (ref <8.0)

## 2022-06-27 LAB — SEDIMENTATION RATE: Sed Rate: 36 mm/h — ABNORMAL HIGH (ref 0–30)

## 2022-06-27 NOTE — Progress Notes (Signed)
Sedimentation rate of 36 is slightly improved compared to 44 before but CRP is way down to 3.2 which is normal now so that is encouraging. There is a very small decrease in measured kidney function compared to 2 months ago. This is not a usual side effect of Humira, but it can be associated with long term use of ibuprofen or naproxen. This is only a small change so okay to just recheck when we follow up next time.

## 2022-07-10 DIAGNOSIS — B349 Viral infection, unspecified: Secondary | ICD-10-CM | POA: Diagnosis not present

## 2022-07-10 DIAGNOSIS — Z03818 Encounter for observation for suspected exposure to other biological agents ruled out: Secondary | ICD-10-CM | POA: Diagnosis not present

## 2022-07-10 DIAGNOSIS — R52 Pain, unspecified: Secondary | ICD-10-CM | POA: Diagnosis not present

## 2022-07-10 DIAGNOSIS — J029 Acute pharyngitis, unspecified: Secondary | ICD-10-CM | POA: Diagnosis not present

## 2022-07-10 DIAGNOSIS — R051 Acute cough: Secondary | ICD-10-CM | POA: Diagnosis not present

## 2022-08-04 DIAGNOSIS — D509 Iron deficiency anemia, unspecified: Secondary | ICD-10-CM | POA: Diagnosis not present

## 2022-08-04 DIAGNOSIS — I10 Essential (primary) hypertension: Secondary | ICD-10-CM | POA: Diagnosis not present

## 2022-08-04 DIAGNOSIS — E7800 Pure hypercholesterolemia, unspecified: Secondary | ICD-10-CM | POA: Diagnosis not present

## 2022-08-04 DIAGNOSIS — E1169 Type 2 diabetes mellitus with other specified complication: Secondary | ICD-10-CM | POA: Diagnosis not present

## 2022-08-04 DIAGNOSIS — R7989 Other specified abnormal findings of blood chemistry: Secondary | ICD-10-CM | POA: Diagnosis not present

## 2022-08-04 DIAGNOSIS — E538 Deficiency of other specified B group vitamins: Secondary | ICD-10-CM | POA: Diagnosis not present

## 2022-08-08 ENCOUNTER — Other Ambulatory Visit: Payer: Self-pay | Admitting: *Deleted

## 2022-08-08 DIAGNOSIS — Z79899 Other long term (current) drug therapy: Secondary | ICD-10-CM

## 2022-08-08 DIAGNOSIS — M450 Ankylosing spondylitis of multiple sites in spine: Secondary | ICD-10-CM

## 2022-08-08 NOTE — Telephone Encounter (Signed)
Refill request received via fax from [employer] for Humira  Next Visit: 08/25/2022  Last Visit: 06/26/2022  Last Fill: 06/09/2022  DX:Ankylosing spondylitis of multiple sites in spine   Current Dose per office note 06/26/2022: Humira 40 mg [place] q14days   Labs: 06/26/2022 Sedimentation rate of 36 is slightly improved compared to 44 before but CRP is way down to 3.2 which is normal now so that is encouraging. There is a very small decrease in measured kidney function compared to 2 months ago.   TB Gold: 04/24/2022 Neg    Okay to refill Humira?

## 2022-08-09 ENCOUNTER — Other Ambulatory Visit: Payer: Self-pay | Admitting: *Deleted

## 2022-08-10 DIAGNOSIS — E7800 Pure hypercholesterolemia, unspecified: Secondary | ICD-10-CM | POA: Diagnosis not present

## 2022-08-10 DIAGNOSIS — M1A9XX1 Chronic gout, unspecified, with tophus (tophi): Secondary | ICD-10-CM | POA: Diagnosis not present

## 2022-08-10 DIAGNOSIS — M459 Ankylosing spondylitis of unspecified sites in spine: Secondary | ICD-10-CM | POA: Diagnosis not present

## 2022-08-10 DIAGNOSIS — K529 Noninfective gastroenteritis and colitis, unspecified: Secondary | ICD-10-CM | POA: Diagnosis not present

## 2022-08-10 DIAGNOSIS — D509 Iron deficiency anemia, unspecified: Secondary | ICD-10-CM | POA: Diagnosis not present

## 2022-08-10 DIAGNOSIS — I10 Essential (primary) hypertension: Secondary | ICD-10-CM | POA: Diagnosis not present

## 2022-08-10 DIAGNOSIS — E119 Type 2 diabetes mellitus without complications: Secondary | ICD-10-CM | POA: Diagnosis not present

## 2022-08-11 MED ORDER — HUMIRA (2 PEN) 40 MG/0.4ML ~~LOC~~ AJKT: 40.0000 mg | AUTO-INJECTOR | SUBCUTANEOUS | 0 refills | Status: DC

## 2022-08-16 DIAGNOSIS — D649 Anemia, unspecified: Secondary | ICD-10-CM | POA: Diagnosis not present

## 2022-08-16 DIAGNOSIS — R159 Full incontinence of feces: Secondary | ICD-10-CM | POA: Diagnosis not present

## 2022-08-16 DIAGNOSIS — R152 Fecal urgency: Secondary | ICD-10-CM | POA: Diagnosis not present

## 2022-08-16 DIAGNOSIS — R112 Nausea with vomiting, unspecified: Secondary | ICD-10-CM | POA: Diagnosis not present

## 2022-08-16 DIAGNOSIS — R109 Unspecified abdominal pain: Secondary | ICD-10-CM | POA: Diagnosis not present

## 2022-08-16 DIAGNOSIS — R197 Diarrhea, unspecified: Secondary | ICD-10-CM | POA: Diagnosis not present

## 2022-08-21 DIAGNOSIS — D649 Anemia, unspecified: Secondary | ICD-10-CM | POA: Diagnosis not present

## 2022-08-25 ENCOUNTER — Ambulatory Visit: Payer: Medicare HMO | Attending: Internal Medicine | Admitting: Internal Medicine

## 2022-08-25 ENCOUNTER — Encounter: Payer: Self-pay | Admitting: Internal Medicine

## 2022-08-25 VITALS — BP 150/79 | HR 63 | Resp 15 | Ht 66.0 in | Wt 189.0 lb

## 2022-08-25 DIAGNOSIS — M1812 Unilateral primary osteoarthritis of first carpometacarpal joint, left hand: Secondary | ICD-10-CM | POA: Diagnosis not present

## 2022-08-25 DIAGNOSIS — M450 Ankylosing spondylitis of multiple sites in spine: Secondary | ICD-10-CM | POA: Diagnosis not present

## 2022-08-25 DIAGNOSIS — E7800 Pure hypercholesterolemia, unspecified: Secondary | ICD-10-CM | POA: Diagnosis not present

## 2022-08-25 DIAGNOSIS — E1169 Type 2 diabetes mellitus with other specified complication: Secondary | ICD-10-CM | POA: Diagnosis not present

## 2022-08-25 DIAGNOSIS — J4521 Mild intermittent asthma with (acute) exacerbation: Secondary | ICD-10-CM | POA: Diagnosis not present

## 2022-08-25 DIAGNOSIS — N1831 Chronic kidney disease, stage 3a: Secondary | ICD-10-CM | POA: Diagnosis not present

## 2022-08-25 DIAGNOSIS — Z79899 Other long term (current) drug therapy: Secondary | ICD-10-CM | POA: Diagnosis not present

## 2022-08-25 DIAGNOSIS — M1A00X0 Idiopathic chronic gout, unspecified site, without tophus (tophi): Secondary | ICD-10-CM | POA: Diagnosis not present

## 2022-08-25 DIAGNOSIS — I10 Essential (primary) hypertension: Secondary | ICD-10-CM | POA: Diagnosis not present

## 2022-08-25 NOTE — Progress Notes (Signed)
Office Visit Note  Patient: Samantha Mcgrath             Date of Birth: [birth year]           MRN: [id number]             PCP: Caren Macadam, MD Referring: Caren Macadam, MD Visit Date: 08/25/2022   Subjective:  Follow-up (Doing good)   History of Present Illness: Samantha Mcgrath is a 72 y.o. female here for follow up for ankylosing spondylitis on Humira 40 mg subcu q. 14 days.  She is now been on treatment for closer to 4 months is tolerating the medicine fine with no injection reaction problem.  She has not had any significant infections since last visit.  So far she is not sure how much the medication is helping with joint pain and stiffness.  Is not having severe back pain issues or awakening at night but still has some morning stiffness.  Left ankle 1 wk, pain with inversion and plantarflexion Mild tenderness low back and sacrum Knees crepitus Hands bony no pain ***  Previous HPI 06/26/22 Samantha Mcgrath is a 72 y.o. female here for follow up for ankylosing spondylitis on Humira 40 mg [place] q14days. She felt an improvement with steroid course but is back to about the same amount of symptoms as before. She also notices some left thumb pain with the cold weather that is unusual for her. So far no difference since starting Humiea but only took 2 doses so far. No intolerance or problems either.    Previous HPI 04/24/22 Samantha Mcgrath is a 72 y.o. female here for a second opinion and transfer of care previously saw Dr. Kathlene November in June for worsening chronic back and joint pain newly diagnosed with ankylosing spondylitis. She was recommended to start treatment with Humira for axial disease but did not feel safe doing so.  Evaluation included x-ray imaging and lab test at thought to be consistent based on abnormal inflammatory markers and syndesmophytes on lumbar spine x-ray Back pain has been an ongoing problem for years but has been more severe or limiting within the past year or 2.  She  experiences pain notably when trying to lie flat in bed but also when standing or walking for prolonged times.  Gets morning stiffness lasting about 1 hour on average also becomes very stiff again if seated position for more than a few minutes.  Notices initial improvement once up and moving but gets increased pain again if walking for too long.  Does awaken her from sleep some nights.  She does not have issues with numbness or radiation of pain into the legs does not experience weakness and had no falls.  She has tried taking ibuprofen 800 mg 3 times daily or naproxen 500 mg twice daily which have only small benefit in her symptoms. She has diabetes and previous amputation of 2nd toe on right foot with osteomyelitis. Also history of gout started years ago in her right great toe but did not require long term medication. Started to have inflammation again and now on allopurinol 250 mg daily. Was previously on colchicine for treatment but off this without ongoing prophylaxis at this time.  She denies any history of inflammatory eye problems.  She experiences diarrhea occasionally no history of inflammatory bowel disease.   Labs reviewed ANA neg RF neg ESR 44 CRP 79   Review of Systems  Constitutional:  Negative for fatigue.  HENT:  Negative for  mouth sores and mouth dryness.   Eyes:  Positive for dryness.  Respiratory:  Negative for shortness of breath.   Cardiovascular:  Negative for chest pain and palpitations.  Gastrointestinal:  Positive for diarrhea. Negative for blood in stool and constipation.  Endocrine: Negative for increased urination.  Genitourinary:  Negative for involuntary urination.  Musculoskeletal:  Positive for morning stiffness. Negative for joint pain, gait problem, joint pain, joint swelling, myalgias, muscle weakness, muscle tenderness and myalgias.  Skin:  Negative for color change, rash, hair loss and sensitivity to sunlight.  Allergic/Immunologic: Negative for susceptible  to infections.  Neurological:  Positive for headaches. Negative for dizziness.  Hematological:  Negative for swollen glands.  Psychiatric/Behavioral:  Negative for depressed mood and sleep disturbance. The patient is not nervous/anxious.     PMFS History:  Patient Active Problem List   Diagnosis Date Noted  . Osteoarthritis of left thumb 06/26/2022  . Ankylosing spondylitis ([place]) 04/24/2022  . High risk medication use 04/24/2022  . Chronic gouty arthritis 02/23/2021  . Osteomyelitis due to type 2 diabetes mellitus ([place]) 10/04/2020  . Osteomyelitis of ankle or foot, acute, right ([place]) 10/04/2020  . Chronic cough 01/06/2020  . Chronic renal failure, stage 3a ([place]) 10/08/2019  . Primary insomnia 03/28/2019  . Primary osteoarthritis of left knee 12/24/2018  . Combined forms of age-related cataract of right eye 08/15/2018  . Gastroesophageal reflux disease without esophagitis 01/29/2018  . Asthma 01/28/2018  . Hyperlipidemia 01/28/2018  . Type 2 diabetes mellitus without complication, with long-term current use of insulin ([place]) 01/28/2018  . Retinal detachment of left eye with multiple breaks 11/14/2016  . Morbid obesity ([place]) 08/17/2015  . Postoperative wound dehiscence 08/17/2015  . Ulcer of right leg, with fat layer exposed ([place]) 08/17/2015  . Wound, surgical, infected, subsequent encounter 08/17/2015  . Essential hypertension 06/01/2014    Past Medical History:  Diagnosis Date  . Asthma   . Bronchitis   . Cataract    bilateral  . Depression   . Diabetes mellitus without complication ([place])   . GERD (gastroesophageal reflux disease)   . Gout   . Hyperlipidemia   . Hypertension   . Pneumonia   . Restrictive airway disease   . Retinal detachment    left    History reviewed. No pertinent family history. Past Surgical History:  Procedure Laterality Date  . AMPUTATION TOE Right 10/06/2020   Procedure: AMPUTATION 2ND TOE;  Surgeon: Landis Martins, DPM;  Location: [facility];   Service: Podiatry;  Laterality: Right;  . AMPUTATION TOE Left 06/02/2021   Procedure: Left 2nd toe amputation;  Surgeon: Wylene Simmer, MD;  Location: [facility];  Service: Orthopedics;  Laterality: Left;  . ANKLE SURGERY    . BLADDER SURGERY    . EYE SURGERY    . MELANOMA EXCISION     Of Right Leg  . TONSILLECTOMY    . TUBAL LIGATION     Social History   Social History Narrative   Diet: Blank      Do you drink/ eat things with caffeine? Yes      Marital status:  Divorced                             What year were you married ? 1969      Do you live in a house, apartment,assistred living, condo, trailer, etc.)? House      Is it one or more stories?  One      How many persons live in your home ? 1      Do you have any pets in your home ?(please list) No      Highest Level of education completed: PHD      Current or past profession: Clergy      Do you exercise?   No                           Type & how often       ADVANCED DIRECTIVES (Please bring copies)      Do you have a living will? Yes      Do you have a DNR form?   Yes                   If not, do you want to discuss one?       Do you have signed POA?HPOA forms?  Yes               If so, please bring to your appointment      FUNCTIONAL STATUS- To be completed by Spouse / child / Staff       Do you have difficulty bathing or dressing yourself ? No      Do you have difficulty preparing food or eating ? No      Do you have difficulty managing your mediation ? No      Do you have difficulty managing your finances ? No      Do you have difficulty affording your medication ? No      Immunization History  Administered Date(s) Administered  . Influenza Split 03/10/2011, 03/28/2012, 03/03/2014, 04/02/2015  . Influenza, High Dose Seasonal PF 03/28/2019, 03/17/2020  . Influenza,inj,Quad PF,6-35 Mos 03/19/2018  . Influenza,trivalent, recombinat, inj, PF 03/03/2014  . Influenza-Unspecified 03/10/2011,  03/28/2012  . Moderna Sars-Covid-2 Vaccination 07/01/2019, 07/29/2019, 04/12/2020  . Tdap 10/28/2014     Objective: Vital Signs: BP (!) 145/82 (BP Location: Left Arm, Patient Position: Sitting, Cuff Size: Normal)   Pulse 66   Resp 15   Ht 5\' 6"  (1.676 m)   Wt 189 lb (85.7 kg)   BMI 30.51 kg/m    Physical Exam   Musculoskeletal Exam: ***  CDAI Exam: CDAI Score: -- Patient Global: --; Provider Global: -- Swollen: --; Tender: -- Joint Exam 08/25/2022   No joint exam has been documented for this visit   There is currently no information documented on the homunculus. Go to the Rheumatology activity and complete the homunculus joint exam.  Investigation: No additional findings.  Imaging: No results found.  Recent Labs: Lab Results  Component Value Date   WBC 7.0 06/26/2022   HGB 11.0 (L) 06/26/2022   PLT 412 (H) 06/26/2022   NA 140 06/26/2022   K 5.3 06/26/2022   CL 103 06/26/2022   CO2 21 06/26/2022   GLUCOSE 112 (H) 06/26/2022   BUN 26 (H) 06/26/2022   CREATININE 1.05 (H) 06/26/2022   BILITOT 0.4 06/26/2022   ALKPHOS 66 10/05/2020   AST 15 06/26/2022   ALT 9 06/26/2022   PROT 6.6 06/26/2022   ALBUMIN 3.2 (L) 10/05/2020   CALCIUM 9.5 06/26/2022   QFTBGOLDPLUS NEGATIVE 04/24/2022    Speciality Comments: No specialty comments available.  Procedures:  No procedures performed Allergies: Procaine   Assessment / Plan:     Visit Diagnoses: No diagnosis found.  ***  Orders: No orders  of the defined types were placed in this encounter.  No orders of the defined types were placed in this encounter.    Follow-Up Instructions: No follow-ups on file.   Collier Salina, MD  Note - This record has been created using [employer].  Chart creation errors have been sought, but may not always  have been located. Such creation errors do not reflect on  the standard of medical care.

## 2022-08-26 LAB — COMPLETE METABOLIC PANEL WITH GFR
AG Ratio: 1.4 (calc) (ref 1.0–2.5)
ALT: 7 U/L (ref 6–29)
AST: 12 U/L (ref 10–35)
Albumin: 4.2 g/dL (ref 3.6–5.1)
Alkaline phosphatase (APISO): 79 U/L (ref 37–153)
BUN/Creatinine Ratio: 22 (calc) (ref 6–22)
BUN: 22 mg/dL (ref 7–25)
CO2: 25 mmol/L (ref 20–32)
Calcium: 10 mg/dL (ref 8.6–10.4)
Chloride: 102 mmol/L (ref 98–110)
Creat: 1.01 mg/dL — ABNORMAL HIGH (ref 0.60–1.00)
Globulin: 3 g/dL (calc) (ref 1.9–3.7)
Glucose, Bld: 106 mg/dL — ABNORMAL HIGH (ref 65–99)
Potassium: 5.3 mmol/L (ref 3.5–5.3)
Sodium: 140 mmol/L (ref 135–146)
Total Bilirubin: 0.4 mg/dL (ref 0.2–1.2)
Total Protein: 7.2 g/dL (ref 6.1–8.1)
eGFR: 59 mL/min/{1.73_m2} — ABNORMAL LOW (ref >=60)

## 2022-08-26 LAB — CBC WITH DIFFERENTIAL/PLATELET
Absolute Monocytes: 765 cells/uL (ref 200–950)
Basophils Absolute: 135 cells/uL (ref 0–200)
Basophils Relative: 1.5 %
Eosinophils Absolute: 711 cells/uL — ABNORMAL HIGH (ref 15–500)
Eosinophils Relative: 7.9 %
HCT: 40.9 % (ref 35.0–45.0)
Hemoglobin: 12.7 g/dL (ref 11.7–15.5)
Lymphs Abs: 2439 cells/uL (ref 850–3900)
MCH: 27.4 pg (ref 27.0–33.0)
MCHC: 31.1 g/dL — ABNORMAL LOW (ref 32.0–36.0)
MCV: 88.3 fL (ref 80.0–100.0)
MPV: 10.7 fL (ref 7.5–12.5)
Monocytes Relative: 8.5 %
Neutro Abs: 4950 cells/uL (ref 1500–7800)
Neutrophils Relative %: 55 %
Platelets: 438 10*3/uL — ABNORMAL HIGH (ref 140–400)
RBC: 4.63 10*6/uL (ref 3.80–5.10)
RDW: 15.8 % — ABNORMAL HIGH (ref 11.0–15.0)
Total Lymphocyte: 27.1 %
WBC: 9 10*3/uL (ref 3.8–10.8)

## 2022-08-26 LAB — URIC ACID: Uric Acid, Serum: 3.4 mg/dL (ref 2.5–7.0)

## 2022-08-26 LAB — SEDIMENTATION RATE: Sed Rate: 31 mm/h — ABNORMAL HIGH (ref 0–30)

## 2022-08-26 NOTE — Progress Notes (Signed)
Sedimentation rate is partially improved down to 31 this is still outside normal range but better than on her previous tests.  Blood count and metabolic panel look fine for continuing the Humira.  Her eosinophil count is slightly elevated at 711 this is often associated with allergies but is not so high that we need to change anything unless she has new symptoms.

## 2022-08-29 DIAGNOSIS — R197 Diarrhea, unspecified: Secondary | ICD-10-CM | POA: Diagnosis not present

## 2022-08-29 DIAGNOSIS — R112 Nausea with vomiting, unspecified: Secondary | ICD-10-CM | POA: Diagnosis not present

## 2022-08-31 DIAGNOSIS — K529 Noninfective gastroenteritis and colitis, unspecified: Secondary | ICD-10-CM | POA: Diagnosis not present

## 2022-08-31 DIAGNOSIS — Z89421 Acquired absence of other right toe(s): Secondary | ICD-10-CM | POA: Diagnosis not present

## 2022-08-31 DIAGNOSIS — E119 Type 2 diabetes mellitus without complications: Secondary | ICD-10-CM | POA: Diagnosis not present

## 2022-09-04 DIAGNOSIS — R152 Fecal urgency: Secondary | ICD-10-CM | POA: Diagnosis not present

## 2022-09-04 DIAGNOSIS — R112 Nausea with vomiting, unspecified: Secondary | ICD-10-CM | POA: Diagnosis not present

## 2022-09-04 DIAGNOSIS — R159 Full incontinence of feces: Secondary | ICD-10-CM | POA: Diagnosis not present

## 2022-09-04 DIAGNOSIS — R197 Diarrhea, unspecified: Secondary | ICD-10-CM | POA: Diagnosis not present

## 2022-09-28 DIAGNOSIS — R944 Abnormal results of kidney function studies: Secondary | ICD-10-CM | POA: Diagnosis not present

## 2022-09-28 DIAGNOSIS — Z89421 Acquired absence of other right toe(s): Secondary | ICD-10-CM | POA: Diagnosis not present

## 2022-09-28 DIAGNOSIS — I10 Essential (primary) hypertension: Secondary | ICD-10-CM | POA: Diagnosis not present

## 2022-09-28 DIAGNOSIS — E7800 Pure hypercholesterolemia, unspecified: Secondary | ICD-10-CM | POA: Diagnosis not present

## 2022-09-28 DIAGNOSIS — E119 Type 2 diabetes mellitus without complications: Secondary | ICD-10-CM | POA: Diagnosis not present

## 2022-10-16 ENCOUNTER — Other Ambulatory Visit: Payer: Self-pay | Admitting: *Deleted

## 2022-10-16 DIAGNOSIS — M450 Ankylosing spondylitis of multiple sites in spine: Secondary | ICD-10-CM

## 2022-10-16 DIAGNOSIS — Z79899 Other long term (current) drug therapy: Secondary | ICD-10-CM

## 2022-10-16 MED ORDER — HUMIRA (2 PEN) 40 MG/0.4ML ~~LOC~~ AJKT: 40.0000 mg | AUTO-INJECTOR | SUBCUTANEOUS | 0 refills | Status: DC

## 2022-10-16 NOTE — Telephone Encounter (Signed)
Last Fill: 08/11/2022  Labs: 08/25/2022 Sedimentation rate is partially improved down to 31 this is still outside normal range but better than on her previous tests.  Blood count and metabolic panel look fine for continuing the Humira.  Her eosinophil count is slightly elevated at 711 this is often associated with allergies but is not so high that we need to change anything unless she has new symptoms.   TB Gold: 04/24/2022 negative   Next Visit: 11/27/2022  Last Visit: 08/25/2022  DX:Ankylosing spondylitis of multiple sites in spine   Current Dose per office note 08/25/2022: Humira 40 mg subcu q. 14 days   Okay to refill Humira?

## 2022-10-25 DIAGNOSIS — L570 Actinic keratosis: Secondary | ICD-10-CM | POA: Diagnosis not present

## 2022-10-25 DIAGNOSIS — Z86006 Personal history of melanoma in-situ: Secondary | ICD-10-CM | POA: Diagnosis not present

## 2022-10-25 DIAGNOSIS — Z7189 Other specified counseling: Secondary | ICD-10-CM | POA: Diagnosis not present

## 2022-10-25 DIAGNOSIS — L821 Other seborrheic keratosis: Secondary | ICD-10-CM | POA: Diagnosis not present

## 2022-10-25 DIAGNOSIS — Z85828 Personal history of other malignant neoplasm of skin: Secondary | ICD-10-CM | POA: Diagnosis not present

## 2022-10-25 DIAGNOSIS — D225 Melanocytic nevi of trunk: Secondary | ICD-10-CM | POA: Diagnosis not present

## 2022-10-25 DIAGNOSIS — M341 CR(E)ST syndrome: Secondary | ICD-10-CM | POA: Diagnosis not present

## 2022-10-25 DIAGNOSIS — Z08 Encounter for follow-up examination after completed treatment for malignant neoplasm: Secondary | ICD-10-CM | POA: Diagnosis not present

## 2022-10-25 DIAGNOSIS — D2272 Melanocytic nevi of left lower limb, including hip: Secondary | ICD-10-CM | POA: Diagnosis not present

## 2022-10-25 DIAGNOSIS — L814 Other melanin hyperpigmentation: Secondary | ICD-10-CM | POA: Diagnosis not present

## 2022-11-24 NOTE — Progress Notes (Unsigned)
Office Visit Note  Patient: Samantha Mcgrath             Date of Birth: [birth year]           MRN: [id number]             PCP: Aliene Beams, MD Referring: Aliene Beams, MD Visit Date: 11/27/2022   Subjective:  No chief complaint on file.   History of Present Illness: Samantha Mcgrath is a 72 y.o. female here for follow up for ankylosing spondylitis on Humira 40 mg subcu q. 14 days.    Previous HPI 08/25/2022 Samantha Mcgrath is a 72 y.o. female here for follow up for ankylosing spondylitis on Humira 40 mg subcu q. 14 days.  She is now been on treatment for closer to 4 months is tolerating the medicine fine with no injection reaction problem.  She has not had any significant infections since last visit.  So far she is not sure how much the medication is helping with joint pain and stiffness.  Is not having severe back pain issues or awakening at night but still has some morning stiffness.  Only new joint issue is left ankle pain affecting the front and side of her ankle started since about 1 week ago.  Not associated with visible swelling or bruising.  She does not recall any specific injury associated with this and painful with movement also gets pain if she has any pressure on that area lying in bed at night.     Previous HPI 06/26/22 Samantha Mcgrath is a 72 y.o. female here for follow up for ankylosing spondylitis on Humira 40 mg [place] q14days. She felt an improvement with steroid course but is back to about the same amount of symptoms as before. She also notices some left thumb pain with the cold weather that is unusual for her. So far no difference since starting Humiea but only took 2 doses so far. No intolerance or problems either.    Previous HPI 04/24/22 Samantha Mcgrath is a 72 y.o. female here for a second opinion and transfer of care previously saw Dr. Deanne Coffer in June for worsening chronic back and joint pain newly diagnosed with ankylosing spondylitis. She was recommended to start  treatment with Humira for axial disease but did not feel safe doing so.  Evaluation included x-ray imaging and lab test at thought to be consistent based on abnormal inflammatory markers and syndesmophytes on lumbar spine x-ray Back pain has been an ongoing problem for years but has been more severe or limiting within the past year or 2.  She experiences pain notably when trying to lie flat in bed but also when standing or walking for prolonged times.  Gets morning stiffness lasting about 1 hour on average also becomes very stiff again if seated position for more than a few minutes.  Notices initial improvement once up and moving but gets increased pain again if walking for too long.  Does awaken her from sleep some nights.  She does not have issues with numbness or radiation of pain into the legs does not experience weakness and had no falls.  She has tried taking ibuprofen 800 mg 3 times daily or naproxen 500 mg twice daily which have only small benefit in her symptoms. She has diabetes and previous amputation of 2nd toe on right foot with osteomyelitis. Also history of gout started years ago in her right great toe but did not require long term medication. Started to  have inflammation again and now on allopurinol 250 mg daily. Was previously on colchicine for treatment but off this without ongoing prophylaxis at this time.  She denies any history of inflammatory eye problems.  She experiences diarrhea occasionally no history of inflammatory bowel disease.   Labs reviewed ANA neg RF neg ESR 44 CRP 79   No Rheumatology ROS completed.   PMFS History:  Patient Active Problem List   Diagnosis Date Noted   Osteoarthritis of left thumb 06/26/2022   Ankylosing spondylitis ([facility]) 04/24/2022   High risk medication use 04/24/2022   Chronic gouty arthritis 02/23/2021   Osteomyelitis due to type 2 diabetes mellitus ([facility]) 10/04/2020   Osteomyelitis of ankle or foot, acute, right ([facility]) 10/04/2020   Chronic  cough 01/06/2020   Chronic renal failure, stage 3a ([facility]) 10/08/2019   Primary insomnia 03/28/2019   Primary osteoarthritis of left knee 12/24/2018   Combined forms of age-related cataract of right eye 08/15/2018   Gastroesophageal reflux disease without esophagitis 01/29/2018   Asthma 01/28/2018   Hyperlipidemia 01/28/2018   Type 2 diabetes mellitus without complication, with long-term current use of insulin ([facility]) 01/28/2018   Retinal detachment of left eye with multiple breaks 11/14/2016   Morbid obesity ([facility]) 08/17/2015   Postoperative wound dehiscence 08/17/2015   Ulcer of right leg, with fat layer exposed ([facility]) 08/17/2015   Wound, surgical, infected, subsequent encounter 08/17/2015   Essential hypertension 06/01/2014    Past Medical History:  Diagnosis Date   Asthma    Bronchitis    Cataract    bilateral   Depression    Diabetes mellitus without complication (HCC)    GERD (gastroesophageal reflux disease)    Gout    Hyperlipidemia    Hypertension    Pneumonia    Restrictive airway disease    Retinal detachment    left    No family history on file. Past Surgical History:  Procedure Laterality Date   AMPUTATION TOE Right 10/06/2020   Procedure: AMPUTATION 2ND TOE;  Surgeon: Asencion Islam, DPM;  Location: [facility];  Service: Podiatry;  Laterality: Right;   AMPUTATION TOE Left 06/02/2021   Procedure: Left 2nd toe amputation;  Surgeon: Toni Arthurs, MD;  Location: [facility];  Service: Orthopedics;  Laterality: Left;   ANKLE SURGERY     BLADDER SURGERY     EYE SURGERY     MELANOMA EXCISION     Of Right Leg   TONSILLECTOMY     TUBAL LIGATION     Social History   Social History Narrative   Diet: Blank      Do you drink/ eat things with caffeine? Yes      Marital status:  Divorced                             What year were you married ? 1969      Do you live in a house, apartment,assistred living, condo, trailer, etc.)? House      Is it one or more  stories? One      How many persons live in your home ? 1      Do you have any pets in your home ?(please list) No      Highest Level of education completed: PHD      Current or past profession: Clergy      Do you exercise?   No  Type & how often       ADVANCED DIRECTIVES (Please bring copies)      Do you have a living will? Yes      Do you have a DNR form?   Yes                   If not, do you want to discuss one?       Do you have signed POA?HPOA forms?  Yes               If so, please bring to your appointment      FUNCTIONAL STATUS- To be completed by Spouse / child / Staff       Do you have difficulty bathing or dressing yourself ? No      Do you have difficulty preparing food or eating ? No      Do you have difficulty managing your mediation ? No      Do you have difficulty managing your finances ? No      Do you have difficulty affording your medication ? No      Immunization History  Administered Date(s) Administered   Influenza Split 03/10/2011, 03/28/2012, 03/03/2014, 04/02/2015   Influenza, High Dose Seasonal PF 03/28/2019, 03/17/2020   Influenza,inj,Quad PF,6-35 Mos 03/19/2018   Influenza,trivalent, recombinat, inj, PF 03/03/2014   Influenza-Unspecified 03/10/2011, 03/28/2012   Moderna Sars-Covid-2 Vaccination 07/01/2019, 07/29/2019, 04/12/2020   Tdap 10/28/2014     Objective: Vital Signs: There were no vitals taken for this visit.   Physical Exam   Musculoskeletal Exam: ***  CDAI Exam: CDAI Score: -- Patient Global: --; Provider Global: -- Swollen: --; Tender: -- Joint Exam 11/27/2022   No joint exam has been documented for this visit   There is currently no information documented on the homunculus. Go to the Rheumatology activity and complete the homunculus joint exam.  Investigation: No additional findings.  Imaging: No results found.  Recent Labs: Lab Results  Component Value Date   WBC 9.0 08/25/2022    HGB 12.7 08/25/2022   PLT 438 (H) 08/25/2022   NA 140 08/25/2022   K 5.3 08/25/2022   CL 102 08/25/2022   CO2 25 08/25/2022   GLUCOSE 106 (H) 08/25/2022   BUN 22 08/25/2022   CREATININE 1.01 (H) 08/25/2022   BILITOT 0.4 08/25/2022   ALKPHOS 66 10/05/2020   AST 12 08/25/2022   ALT 7 08/25/2022   PROT 7.2 08/25/2022   ALBUMIN 3.2 (L) 10/05/2020   CALCIUM 10.0 08/25/2022   QFTBGOLDPLUS NEGATIVE 04/24/2022    Speciality Comments: No specialty comments available.  Procedures:  No procedures performed Allergies: Procaine   Assessment / Plan:     Visit Diagnoses: No diagnosis found.  ***  Orders: No orders of the defined types were placed in this encounter.  No orders of the defined types were placed in this encounter.    Follow-Up Instructions: No follow-ups on file.   Ellen Henri, CMA  Note - This record has been created using [employer].  Chart creation errors have been sought, but may not always  have been located. Such creation errors do not reflect on  the standard of medical care.

## 2022-11-27 ENCOUNTER — Ambulatory Visit: Payer: Medicare HMO | Attending: Internal Medicine | Admitting: Internal Medicine

## 2022-11-27 ENCOUNTER — Ambulatory Visit: Payer: Medicare HMO

## 2022-11-27 ENCOUNTER — Ambulatory Visit (INDEPENDENT_AMBULATORY_CARE_PROVIDER_SITE_OTHER): Payer: Medicare HMO

## 2022-11-27 ENCOUNTER — Encounter: Payer: Self-pay | Admitting: Internal Medicine

## 2022-11-27 VITALS — BP 154/66 | HR 63 | Resp 14 | Ht 65.0 in | Wt 210.0 lb

## 2022-11-27 DIAGNOSIS — M1A00X0 Idiopathic chronic gout, unspecified site, without tophus (tophi): Secondary | ICD-10-CM | POA: Diagnosis not present

## 2022-11-27 DIAGNOSIS — M25561 Pain in right knee: Secondary | ICD-10-CM

## 2022-11-27 DIAGNOSIS — M25562 Pain in left knee: Secondary | ICD-10-CM | POA: Diagnosis not present

## 2022-11-27 DIAGNOSIS — Z79899 Other long term (current) drug therapy: Secondary | ICD-10-CM

## 2022-11-27 DIAGNOSIS — M1812 Unilateral primary osteoarthritis of first carpometacarpal joint, left hand: Secondary | ICD-10-CM | POA: Diagnosis not present

## 2022-11-27 DIAGNOSIS — G8929 Other chronic pain: Secondary | ICD-10-CM

## 2022-11-27 DIAGNOSIS — M1A9XX0 Chronic gout, unspecified, without tophus (tophi): Secondary | ICD-10-CM

## 2022-11-27 DIAGNOSIS — M450 Ankylosing spondylitis of multiple sites in spine: Secondary | ICD-10-CM | POA: Diagnosis not present

## 2022-11-28 DIAGNOSIS — E7800 Pure hypercholesterolemia, unspecified: Secondary | ICD-10-CM | POA: Diagnosis not present

## 2022-11-28 DIAGNOSIS — E119 Type 2 diabetes mellitus without complications: Secondary | ICD-10-CM | POA: Diagnosis not present

## 2022-11-28 DIAGNOSIS — Z89421 Acquired absence of other right toe(s): Secondary | ICD-10-CM | POA: Diagnosis not present

## 2022-11-28 DIAGNOSIS — E1169 Type 2 diabetes mellitus with other specified complication: Secondary | ICD-10-CM | POA: Diagnosis not present

## 2022-11-28 DIAGNOSIS — I10 Essential (primary) hypertension: Secondary | ICD-10-CM | POA: Diagnosis not present

## 2022-11-28 LAB — COMPLETE METABOLIC PANEL WITH GFR
AG Ratio: 1.5 (calc) (ref 1.0–2.5)
ALT: 10 U/L (ref 6–29)
AST: 13 U/L (ref 10–35)
Albumin: 4.2 g/dL (ref 3.6–5.1)
Alkaline phosphatase (APISO): 111 U/L (ref 37–153)
BUN/Creatinine Ratio: 34 (calc) — ABNORMAL HIGH (ref 6–22)
BUN: 34 mg/dL — ABNORMAL HIGH (ref 7–25)
CO2: 23 mmol/L (ref 20–32)
Calcium: 10 mg/dL (ref 8.6–10.4)
Chloride: 106 mmol/L (ref 98–110)
Creat: 0.99 mg/dL (ref 0.60–1.00)
Globulin: 2.8 g/dL (calc) (ref 1.9–3.7)
Glucose, Bld: 78 mg/dL (ref 65–99)
Potassium: 4.6 mmol/L (ref 3.5–5.3)
Sodium: 141 mmol/L (ref 135–146)
Total Bilirubin: 0.5 mg/dL (ref 0.2–1.2)
Total Protein: 7 g/dL (ref 6.1–8.1)
eGFR: 60 mL/min/{1.73_m2} (ref >=60)

## 2022-11-28 LAB — CBC WITH DIFFERENTIAL/PLATELET
Absolute Monocytes: 761 cells/uL (ref 200–950)
Basophils Absolute: 81 cells/uL (ref 0–200)
Basophils Relative: 1 %
Eosinophils Absolute: 259 cells/uL (ref 15–500)
Eosinophils Relative: 3.2 %
HCT: 41.7 % (ref 35.0–45.0)
Hemoglobin: 13.7 g/dL (ref 11.7–15.5)
Lymphs Abs: 2770 cells/uL (ref 850–3900)
MCH: 29.7 pg (ref 27.0–33.0)
MCHC: 32.9 g/dL (ref 32.0–36.0)
MCV: 90.5 fL (ref 80.0–100.0)
MPV: 10.7 fL (ref 7.5–12.5)
Monocytes Relative: 9.4 %
Neutro Abs: 4228 cells/uL (ref 1500–7800)
Neutrophils Relative %: 52.2 %
Platelets: 299 10*3/uL (ref 140–400)
RBC: 4.61 10*6/uL (ref 3.80–5.10)
RDW: 14.3 % (ref 11.0–15.0)
Total Lymphocyte: 34.2 %
WBC: 8.1 10*3/uL (ref 3.8–10.8)

## 2022-11-28 LAB — SEDIMENTATION RATE: Sed Rate: 17 mm/h (ref 0–30)

## 2022-11-28 MED ORDER — TRIAMCINOLONE ACETONIDE 40 MG/ML IJ SUSP
40.0000 mg | INTRAMUSCULAR | Status: AC | PRN
Start: 2022-11-27 — End: 2022-11-27
  Administered 2022-11-27: 40 mg via INTRA_ARTICULAR

## 2022-11-28 MED ORDER — LIDOCAINE HCL 1 % IJ SOLN
2.0000 mL | INTRAMUSCULAR | Status: AC | PRN
Start: 2022-11-27 — End: 2022-11-27
  Administered 2022-11-27: 2 mL

## 2022-11-28 NOTE — Progress Notes (Signed)
Sedimentation rate improved from 31 down to 17 so this is suggestive that the Humira is decreasing the previously observed inflammation.  I recommend she stay on this and will continue to see if we get more benefit in her joint inflammation.  Blood count and metabolic panel look fine.

## 2023-01-01 ENCOUNTER — Other Ambulatory Visit: Payer: Self-pay | Admitting: *Deleted

## 2023-01-01 DIAGNOSIS — M450 Ankylosing spondylitis of multiple sites in spine: Secondary | ICD-10-CM

## 2023-01-01 DIAGNOSIS — Z79899 Other long term (current) drug therapy: Secondary | ICD-10-CM

## 2023-01-01 MED ORDER — HUMIRA (2 PEN) 40 MG/0.4ML ~~LOC~~ AJKT: 40.0000 mg | AUTO-INJECTOR | SUBCUTANEOUS | 2 refills | Status: DC

## 2023-01-01 NOTE — Telephone Encounter (Signed)
Last Fill: 10/16/2022  Labs: 11/27/2022 Sedimentation rate improved from 31 down to 17 so this is suggestive that the Humira is decreasing the previously observed inflammation.  I recommend she stay on this and will continue to see if we get more benefit in her joint inflammation.  Blood count and metabolic panel look fine.   TB Gold: 04/24/2022  NEGATIVE   Next Visit: 02/27/2023  Last Visit: 11/27/2022   DX:Ankylosing spondylitis of multiple sites in spine   Current Dose per office note 11/27/2022: Humira 40 mg subcu q. 14 days.   Okay to refill Humira?

## 2023-01-24 ENCOUNTER — Other Ambulatory Visit: Payer: Self-pay | Admitting: Family Medicine

## 2023-01-24 DIAGNOSIS — Z1231 Encounter for screening mammogram for malignant neoplasm of breast: Secondary | ICD-10-CM

## 2023-02-13 NOTE — Progress Notes (Unsigned)
Office Visit Note  Patient: Samantha Mcgrath             Date of Birth: 04-17-1951           MRN: [id number]             PCP: Aliene Beams, MD Referring: Aliene Beams, MD Visit Date: 02/27/2023   Subjective:  Follow-up   History of Present Illness: Samantha Mcgrath is a 72 y.o. female here for follow up for ankylosing spondylitis on Humira 40 mg subcu q. 14 days.  Knee pain significantly improved after bilateral steroid injections last visit benefit has worn off during the past 1 month.  Hand pain is worse at the left thumb this is usually more provoked with use.  She feels occasional knee buckling type sensation but does not specifically get locked in place and has not had any falls.  Previous HPI 11/27/2022 Samantha Mcgrath is a 72 y.o. female here for follow up for ankylosing spondylitis on Humira 40 mg subcu q. 14 days.  Joint pain is doing okay in some areas but has left thumb and bilateral knee pain. Knee pain is especially bad with any deep bending. She cannot get up from the floor without assistance or severe pain, also climbing stairs has to lead with just one side. Does see visible swelling on some days. She has increased ankle swelling recently also, increased after trip to the mountains 2 weeks ago.   Previous HPI 08/25/2022 Samantha Mcgrath is a 72 y.o. female here for follow up for ankylosing spondylitis on Humira 40 mg subcu q. 14 days.  She is now been on treatment for closer to 4 months is tolerating the medicine fine with no injection reaction problem.  She has not had any significant infections since last visit.  So far she is not sure how much the medication is helping with joint pain and stiffness.  Is not having severe back pain issues or awakening at night but still has some morning stiffness.  Only new joint issue is left ankle pain affecting the front and side of her ankle started since about 1 week ago.  Not associated with visible swelling or bruising.  She does not  recall any specific injury associated with this and painful with movement also gets pain if she has any pressure on that area lying in bed at night.   Previous HPI 06/26/22 Samantha Mcgrath is a 72 y.o. female here for follow up for ankylosing spondylitis on Humira 40 mg [place] q14days. She felt an improvement with steroid course but is back to about the same amount of symptoms as before. She also notices some left thumb pain with the cold weather that is unusual for her. So far no difference since starting Humiea but only took 2 doses so far. No intolerance or problems either.    Previous HPI 04/24/22 Samantha Mcgrath is a 72 y.o. female here for a second opinion and transfer of care previously saw Dr. Deanne Coffer in June for worsening chronic back and joint pain newly diagnosed with ankylosing spondylitis. She was recommended to start treatment with Humira for axial disease but did not feel safe doing so.  Evaluation included x-ray imaging and lab test at thought to be consistent based on abnormal inflammatory markers and syndesmophytes on lumbar spine x-ray Back pain has been an ongoing problem for years but has been more severe or limiting within the past year or 2.  She experiences pain notably  when trying to lie flat in bed but also when standing or walking for prolonged times.  Gets morning stiffness lasting about 1 hour on average also becomes very stiff again if seated position for more than a few minutes.  Notices initial improvement once up and moving but gets increased pain again if walking for too long.  Does awaken her from sleep some nights.  She does not have issues with numbness or radiation of pain into the legs does not experience weakness and had no falls.  She has tried taking ibuprofen 800 mg 3 times daily or naproxen 500 mg twice daily which have only small benefit in her symptoms. She has diabetes and previous amputation of 2nd toe on right foot with osteomyelitis. Also history of gout started  years ago in her right great toe but did not require long term medication. Started to have inflammation again and now on allopurinol 250 mg daily. Was previously on colchicine for treatment but off this without ongoing prophylaxis at this time.  She denies any history of inflammatory eye problems.  She experiences diarrhea occasionally no history of inflammatory bowel disease.   Labs reviewed ANA neg RF neg ESR 44 CRP 79   Review of Systems  Constitutional:  Negative for fatigue.  HENT:  Negative for mouth sores and mouth dryness.   Eyes:  Negative for dryness.  Respiratory:  Negative for shortness of breath.   Cardiovascular:  Negative for chest pain and palpitations.  Gastrointestinal:  Negative for blood in stool, constipation and diarrhea.  Endocrine: Negative for increased urination.  Genitourinary:  Negative for involuntary urination.  Musculoskeletal:  Positive for joint pain, joint pain, joint swelling, myalgias, morning stiffness, muscle tenderness and myalgias. Negative for gait problem and muscle weakness.  Skin:  Negative for color change, rash, hair loss and sensitivity to sunlight.  Allergic/Immunologic: Negative for susceptible to infections.  Neurological:  Negative for dizziness and headaches.  Hematological:  Negative for swollen glands.  Psychiatric/Behavioral:  Negative for depressed mood and sleep disturbance. The patient is not nervous/anxious.     PMFS History:  Patient Active Problem List   Diagnosis Date Noted   Bilateral knee pain 11/27/2022   Osteoarthritis of left thumb 06/26/2022   Ankylosing spondylitis (HCC) 04/24/2022   High risk medication use 04/24/2022   Chronic gouty arthritis 02/23/2021   Osteomyelitis due to type 2 diabetes mellitus (HCC) 10/04/2020   Osteomyelitis of ankle or foot, acute, right (HCC) 10/04/2020   Chronic cough 01/06/2020   Chronic renal failure, stage 3a (HCC) 10/08/2019   Primary insomnia 03/28/2019   Primary  osteoarthritis of left knee 12/24/2018   Combined forms of age-related cataract of right eye 08/15/2018   Gastroesophageal reflux disease without esophagitis 01/29/2018   Asthma 01/28/2018   Hyperlipidemia 01/28/2018   Type 2 diabetes mellitus without complication, with long-term current use of insulin (HCC) 01/28/2018   Retinal detachment of left eye with multiple breaks 11/14/2016   Morbid obesity (HCC) 08/17/2015   Postoperative wound dehiscence 08/17/2015   Ulcer of right leg, with fat layer exposed (HCC) 08/17/2015   Wound, surgical, infected, subsequent encounter 08/17/2015   Essential hypertension 06/01/2014    Past Medical History:  Diagnosis Date   Asthma    Bronchitis    Cataract    bilateral   Depression    Diabetes mellitus without complication (HCC)    GERD (gastroesophageal reflux disease)    Gout    Hyperlipidemia    Hypertension    Pneumonia  Restrictive airway disease    Retinal detachment    left    History reviewed. No pertinent family history. Past Surgical History:  Procedure Laterality Date   AMPUTATION TOE Right 10/06/2020   Procedure: AMPUTATION 2ND TOE;  Surgeon: Asencion Islam, DPM;  Location: [facility];  Service: Podiatry;  Laterality: Right;   AMPUTATION TOE Left 06/02/2021   Procedure: Left 2nd toe amputation;  Surgeon: Toni Arthurs, MD;  Location: [facility];  Service: Orthopedics;  Laterality: Left;   ANKLE SURGERY     BLADDER SURGERY     EYE SURGERY     MELANOMA EXCISION     Of Right Leg   TONSILLECTOMY     TUBAL LIGATION     Social History   Social History Narrative   Diet: Blank      Do you drink/ eat things with caffeine? Yes      Marital status:  Divorced                             What year were you married ? 1969      Do you live in a house, apartment,assistred living, condo, trailer, etc.)? House      Is it one or more stories? One      How many persons live in your home ? 1      Do you have any pets in  your home ?(please list) No      Highest Level of education completed: PHD      Current or past profession: Clergy      Do you exercise?   No                           Type & how often       ADVANCED DIRECTIVES (Please bring copies)      Do you have a living will? Yes      Do you have a DNR form?   Yes                   If not, do you want to discuss one?       Do you have signed POA?HPOA forms?  Yes               If so, please bring to your appointment      FUNCTIONAL STATUS- To be completed by Spouse / child / Staff       Do you have difficulty bathing or dressing yourself ? No      Do you have difficulty preparing food or eating ? No      Do you have difficulty managing your mediation ? No      Do you have difficulty managing your finances ? No      Do you have difficulty affording your medication ? No      Immunization History  Administered Date(s) Administered   Influenza Split 03/10/2011, 03/28/2012, 03/03/2014, 04/02/2015   Influenza, High Dose Seasonal PF 03/28/2019, 03/17/2020   Influenza,inj,Quad PF,6-35 Mos 03/19/2018   Influenza,trivalent, recombinat, inj, PF 03/03/2014   Influenza-Unspecified 03/10/2011, 03/28/2012   Moderna Sars-Covid-2 Vaccination 07/01/2019, 07/29/2019, 04/12/2020   Tdap 10/28/2014     Objective: Vital Signs: BP 127/77 (BP Location: Left Arm, Patient Position: Sitting, Cuff Size: Normal)   Pulse 63   Resp 14   Ht 5\' 6"  (1.676 m)   Wt 218  lb (98.9 kg)   BMI 35.19 kg/m    Physical Exam Eyes:     Conjunctiva/sclera: Conjunctivae normal.  Cardiovascular:     Rate and Rhythm: Normal rate and regular rhythm.  Pulmonary:     Effort: Pulmonary effort is normal.     Breath sounds: Normal breath sounds.  Musculoskeletal:     Right lower leg: No edema.     Left lower leg: No edema.  Lymphadenopathy:     Cervical: No cervical adenopathy.  Skin:    General: Skin is warm and dry.     Findings: No rash.  Neurological:     Mental  Status: She is alert.  Psychiatric:        Mood and Affect: Mood normal.      Musculoskeletal Exam:  Shoulders full ROM no tenderness or swelling Elbows full ROM no tenderness or swelling Wrists full ROM no tenderness or swelling Fingers full ROM tenderness to pressure at left first CMC joint with no palpable swelling, Finkelstein's mildly painful Knees full ROM, tenderness to pressure along joint line worse anteriorly more severe on the right knee than left, no palpable effusions Ankles full ROM no tenderness or swelling   Investigation: No additional findings.  Imaging: No results found.  Recent Labs: Lab Results  Component Value Date   WBC 8.1 11/27/2022   HGB 13.7 11/27/2022   PLT 299 11/27/2022   NA 141 11/27/2022   K 4.6 11/27/2022   CL 106 11/27/2022   CO2 23 11/27/2022   GLUCOSE 78 11/27/2022   BUN 34 (H) 11/27/2022   CREATININE 0.99 11/27/2022   BILITOT 0.5 11/27/2022   ALKPHOS 66 10/05/2020   AST 13 11/27/2022   ALT 10 11/27/2022   PROT 7.0 11/27/2022   ALBUMIN 3.2 (L) 10/05/2020   CALCIUM 10.0 11/27/2022   QFTBGOLDPLUS NEGATIVE 04/24/2022    Speciality Comments: No specialty comments available.  Procedures:  Large Joint Inj: bilateral knee on 02/27/2023 1:30 PM Indications: pain Details: 27 G 1.5 in needle, medial approach Medications (Right): 3 mL lidocaine 1 %; 40 mg triamcinolone acetonide 40 MG/ML Medications (Left): 3 mL lidocaine 1 %; 40 mg triamcinolone acetonide 40 MG/ML Procedure, treatment alternatives, risks and benefits explained, specific risks discussed. Consent was given by the patient. Immediately prior to procedure a time out was called to verify the correct patient, procedure, equipment, support staff and site/side marked as required. Patient was prepped and draped in the usual sterile fashion.     Allergies: Procaine   Assessment / Plan:     Visit Diagnoses: Ankylosing spondylitis of multiple sites in spine (HCC) - Plan:  Sedimentation rate, Adalimumab Drug Level and Anti-drug Antibody for Rheumatic Diseases  No apparent synovitis no severe increase in inflammatory back pain type symptoms.  Will recheck sed rate for disease activity monitoring.  Overall she is having significant amount of symptoms still I will check a trough Humira level as she is due for her injection tomorrow if this is low consider dose titration.  High risk medication use - Humira 40 mg subcu q. 14 days - Plan: CBC with Differential/Platelet, COMPLETE METABOLIC PANEL WITH GFR  Checking CBC and CMP for medication monitoring on continued longer use of Humira.  No serious interval infections.  Last QuantiFERON from last November was negative.  Chronic gouty arthritis - uric acid: 3.4 on 08/25/2022.  Chronic pain of both knees - S/P Large Joint Inj: bilateral knee on 11/27/2022  Bilateral knee pain I believe is more related  to her osteoarthritis than active inflammatory disease.  Had 2 months of benefit with steroid injection we will repeat bilateral steroid injection today.  Osteoarthritis of left thumb  Chronic use related pain looks consistent of osteoarthritis.  She is now making plans to consider retirement sometime next year that would probably be beneficial for her osteoarthritis.  Orders: Orders Placed This Encounter  Procedures   Large Joint Inj   Sedimentation rate   CBC with Differential/Platelet   COMPLETE METABOLIC PANEL WITH GFR   Adalimumab Drug Level and Anti-drug Antibody for Rheumatic Diseases   No orders of the defined types were placed in this encounter.    Follow-Up Instructions: Return in about 3 months (around 05/29/2023) for AS on ADA/inj f/u 3mos.   Fuller Plan, MD  Note - This record has been created using [employer].  Chart creation errors have been sought, but may not always  have been located. Such creation errors do not reflect on  the standard of medical care.

## 2023-02-26 DIAGNOSIS — M7741 Metatarsalgia, right foot: Secondary | ICD-10-CM | POA: Diagnosis not present

## 2023-02-26 DIAGNOSIS — L84 Corns and callosities: Secondary | ICD-10-CM | POA: Diagnosis not present

## 2023-02-26 DIAGNOSIS — S93145S Subluxation of metatarsophalangeal joint of left lesser toe(s), sequela: Secondary | ICD-10-CM | POA: Diagnosis not present

## 2023-02-26 DIAGNOSIS — E11628 Type 2 diabetes mellitus with other skin complications: Secondary | ICD-10-CM | POA: Diagnosis not present

## 2023-02-26 DIAGNOSIS — D84821 Immunodeficiency due to drugs: Secondary | ICD-10-CM | POA: Diagnosis not present

## 2023-02-26 DIAGNOSIS — Z89421 Acquired absence of other right toe(s): Secondary | ICD-10-CM | POA: Diagnosis not present

## 2023-02-26 DIAGNOSIS — Z89422 Acquired absence of other left toe(s): Secondary | ICD-10-CM | POA: Diagnosis not present

## 2023-02-26 DIAGNOSIS — Z79899 Other long term (current) drug therapy: Secondary | ICD-10-CM | POA: Diagnosis not present

## 2023-02-26 DIAGNOSIS — M2042 Other hammer toe(s) (acquired), left foot: Secondary | ICD-10-CM | POA: Diagnosis not present

## 2023-02-26 DIAGNOSIS — M7742 Metatarsalgia, left foot: Secondary | ICD-10-CM | POA: Diagnosis not present

## 2023-02-27 ENCOUNTER — Ambulatory Visit: Payer: Medicare HMO | Attending: Internal Medicine | Admitting: Internal Medicine

## 2023-02-27 ENCOUNTER — Encounter: Payer: Self-pay | Admitting: Internal Medicine

## 2023-02-27 VITALS — BP 127/77 | HR 63 | Resp 14 | Ht 66.0 in | Wt 218.0 lb

## 2023-02-27 DIAGNOSIS — Z79899 Other long term (current) drug therapy: Secondary | ICD-10-CM | POA: Diagnosis not present

## 2023-02-27 DIAGNOSIS — M25561 Pain in right knee: Secondary | ICD-10-CM | POA: Diagnosis not present

## 2023-02-27 DIAGNOSIS — M450 Ankylosing spondylitis of multiple sites in spine: Secondary | ICD-10-CM

## 2023-02-27 DIAGNOSIS — G8929 Other chronic pain: Secondary | ICD-10-CM

## 2023-02-27 DIAGNOSIS — M25562 Pain in left knee: Secondary | ICD-10-CM

## 2023-02-27 DIAGNOSIS — M1A00X0 Idiopathic chronic gout, unspecified site, without tophus (tophi): Secondary | ICD-10-CM

## 2023-02-27 DIAGNOSIS — M1A9XX0 Chronic gout, unspecified, without tophus (tophi): Secondary | ICD-10-CM

## 2023-02-27 DIAGNOSIS — M1812 Unilateral primary osteoarthritis of first carpometacarpal joint, left hand: Secondary | ICD-10-CM | POA: Diagnosis not present

## 2023-02-28 LAB — CBC WITH DIFFERENTIAL/PLATELET
Absolute Monocytes: 680 cells/uL (ref 200–950)
Basophils Absolute: 97 cells/uL (ref 0–200)
Basophils Relative: 1.2 %
Eosinophils Absolute: 454 cells/uL (ref 15–500)
Eosinophils Relative: 5.6 %
HCT: 43.1 % (ref 35.0–45.0)
Hemoglobin: 13.6 g/dL (ref 11.7–15.5)
Lymphs Abs: 2438 cells/uL (ref 850–3900)
MCH: 29.8 pg (ref 27.0–33.0)
MCHC: 31.6 g/dL — ABNORMAL LOW (ref 32.0–36.0)
MCV: 94.5 fL (ref 80.0–100.0)
MPV: 11.1 fL (ref 7.5–12.5)
Monocytes Relative: 8.4 %
Neutro Abs: 4431 cells/uL (ref 1500–7800)
Neutrophils Relative %: 54.7 %
Platelets: 299 10*3/uL (ref 140–400)
RBC: 4.56 10*6/uL (ref 3.80–5.10)
RDW: 14.5 % (ref 11.0–15.0)
Total Lymphocyte: 30.1 %
WBC: 8.1 10*3/uL (ref 3.8–10.8)

## 2023-02-28 LAB — COMPLETE METABOLIC PANEL WITH GFR
AG Ratio: 1.6 (calc) (ref 1.0–2.5)
ALT: 10 U/L (ref 6–29)
AST: 11 U/L (ref 10–35)
Albumin: 4.1 g/dL (ref 3.6–5.1)
Alkaline phosphatase (APISO): 95 U/L (ref 37–153)
BUN/Creatinine Ratio: 32 (calc) — ABNORMAL HIGH (ref 6–22)
BUN: 41 mg/dL — ABNORMAL HIGH (ref 7–25)
CO2: 26 mmol/L (ref 20–32)
Calcium: 9.7 mg/dL (ref 8.6–10.4)
Chloride: 104 mmol/L (ref 98–110)
Creat: 1.3 mg/dL — ABNORMAL HIGH (ref 0.60–1.00)
Globulin: 2.5 g/dL (calc) (ref 1.9–3.7)
Glucose, Bld: 169 mg/dL — ABNORMAL HIGH (ref 65–99)
Potassium: 4 mmol/L (ref 3.5–5.3)
Sodium: 140 mmol/L (ref 135–146)
Total Bilirubin: 0.6 mg/dL (ref 0.2–1.2)
Total Protein: 6.6 g/dL (ref 6.1–8.1)
eGFR: 43 mL/min/{1.73_m2} — ABNORMAL LOW (ref >=60)

## 2023-02-28 LAB — SEDIMENTATION RATE: Sed Rate: 17 mm/h (ref 0–30)

## 2023-02-28 MED ORDER — LIDOCAINE HCL 1 % IJ SOLN
3.0000 mL | INTRAMUSCULAR | Status: AC | PRN
Start: 2023-02-27 — End: 2023-02-27
  Administered 2023-02-27: 3 mL

## 2023-02-28 MED ORDER — TRIAMCINOLONE ACETONIDE 40 MG/ML IJ SUSP
40.0000 mg | INTRAMUSCULAR | Status: AC | PRN
Start: 2023-02-27 — End: 2023-02-27
  Administered 2023-02-27: 40 mg via INTRA_ARTICULAR

## 2023-03-07 LAB — ADALIMUMAB DRUG LEVEL AND ANTI-DRUG AB FOR RHEUMATIC DISEASE
ADALIMUMAB AB,RHEUM: <10 [AU] (ref <10)
ADALIMUMAB LEVEL, RHEUM: 21.2 ug/mL

## 2023-03-07 NOTE — Progress Notes (Signed)
Sedimentation rate 17 is normal.  Her blood count is normal.   The kidney function test looks slightly worse with a estimated GFR of 43 previously hers was 60. This is not usually an effect of Humira. Has she been taking any additional over the counter medications or change in her diet or fluid intake recently?   The Humira level is high so clearance of the medicine is not the problem. If she still does not see more improvement when we follow up I would recommend trying a different medication.

## 2023-03-19 ENCOUNTER — Other Ambulatory Visit: Payer: Self-pay

## 2023-03-19 DIAGNOSIS — M450 Ankylosing spondylitis of multiple sites in spine: Secondary | ICD-10-CM

## 2023-03-19 DIAGNOSIS — Z79899 Other long term (current) drug therapy: Secondary | ICD-10-CM

## 2023-03-19 MED ORDER — HUMIRA (2 PEN) 40 MG/0.4ML ~~LOC~~ AJKT
40.0000 mg | AUTO-INJECTOR | SUBCUTANEOUS | 2 refills | Status: DC
Start: 2023-03-19 — End: 2023-12-13

## 2023-03-19 NOTE — Telephone Encounter (Signed)
Refill request received via fax from [facility] for Humira  Last Fill: 01/01/2023  Labs: 02/27/2023 Sedimentation rate 17 is normal.  Her blood count is normal. The kidney function test looks slightly worse with a estimated GFR of 43 previously hers was 60. This is not usually an effect of Humira. Has she been taking any additional over the counter medications or change in her diet or fluid intake recently? The Humira level is high so clearance of the medicine is not the problem. If she still does not see more improvement when we follow up I would recommend trying a different medication.  TB Gold: 04/24/2022 Negative   Next Visit: 05/31/2023  Last Visit: 02/27/2023  DX:Ankylosing spondylitis of multiple sites in spine   Current Dose per office note 02/27/2023: Humira 40 mg subcu q. 14 days   Okay to refill Humira?

## 2023-03-20 ENCOUNTER — Ambulatory Visit
Admission: RE | Admit: 2023-03-20 | Discharge: 2023-03-20 | Disposition: A | Discharge status: Home / Self Care | Payer: Medicare HMO | Source: Ambulatory Visit | Attending: Family Medicine | Admitting: Family Medicine

## 2023-03-20 DIAGNOSIS — Z1231 Encounter for screening mammogram for malignant neoplasm of breast: Secondary | ICD-10-CM

## 2023-03-23 ENCOUNTER — Other Ambulatory Visit: Payer: Self-pay | Admitting: Family Medicine

## 2023-03-23 DIAGNOSIS — R928 Other abnormal and inconclusive findings on diagnostic imaging of breast: Secondary | ICD-10-CM

## 2023-03-23 DIAGNOSIS — N6489 Other specified disorders of breast: Secondary | ICD-10-CM

## 2023-04-03 ENCOUNTER — Ambulatory Visit
Admission: RE | Admit: 2023-04-03 | Discharge: 2023-04-03 | Disposition: A | Discharge status: Home / Self Care | Payer: Medicare HMO | Source: Ambulatory Visit | Attending: Family Medicine | Admitting: Family Medicine

## 2023-04-03 ENCOUNTER — Ambulatory Visit: Payer: Medicare HMO

## 2023-04-03 DIAGNOSIS — R928 Other abnormal and inconclusive findings on diagnostic imaging of breast: Secondary | ICD-10-CM

## 2023-05-14 ENCOUNTER — Other Ambulatory Visit: Payer: Self-pay | Admitting: Family Medicine

## 2023-05-14 DIAGNOSIS — R053 Chronic cough: Secondary | ICD-10-CM

## 2023-05-14 DIAGNOSIS — M8580 Other specified disorders of bone density and structure, unspecified site: Secondary | ICD-10-CM

## 2023-05-17 ENCOUNTER — Encounter: Payer: Self-pay | Admitting: Internal Medicine

## 2023-05-21 NOTE — Progress Notes (Signed)
Office Visit Note  Patient: Samantha Mcgrath             Date of Birth: Aug 09, 1950           MRN: [id number]             PCP: Aliene Beams, MD Referring: Aliene Beams, MD Visit Date: 05/31/2023   Subjective:  Follow-up   Discussed the use of AI scribe software for clinical note transcription with the patient, who gave verbal consent to proceed.  History of Present Illness   Samantha Mcgrath is a 72 y.o. female here for follow up for ankylosing spondylitis on Humira 40 mg subcu q. 14 days.  She presents with worsening bilateral knee pain. The pain is worse at night and when rising from a seated position, but improves with movement. The patient reports that the left knee is more painful, while the right knee swells more, sometimes extending into the leg. The patient also experiences sporadic instability in the right knee, causing fear of falling.  In June, the patient received bilateral knee injections which provided significant relief; however, a repeat injection in October did not yield the same benefit. The patient is unable to take NSAIDs and is not currently on any pain relief medication.  The patient also reports ongoing lower back pain, possibly related to her spondylitis. The pain is located in the lower back or possibly the right hip, and is worse when standing in one place for extended periods. The patient also notes pain in both thumbs.  The patient is currently on Humira for inflammation but reports minimal relief in knee and back pain. The patient also has a scheduled toe amputation in January due to hammer toes.   Previous HPI 02/27/2023 Samantha Mcgrath is a 72 y.o. female here for follow up for ankylosing spondylitis on Humira 40 mg subcu q. 14 days.  Knee pain significantly improved after bilateral steroid injections last visit benefit has worn off during the past 1 month.  Hand pain is worse at the left thumb this is usually more provoked with use.  She feels occasional  knee buckling type sensation but does not specifically get locked in place and has not had any falls.   Previous HPI 11/27/2022 Samantha Mcgrath is a 72 y.o. female here for follow up for ankylosing spondylitis on Humira 40 mg subcu q. 14 days.  Joint pain is doing okay in some areas but has left thumb and bilateral knee pain. Knee pain is especially bad with any deep bending. She cannot get up from the floor without assistance or severe pain, also climbing stairs has to lead with just one side. Does see visible swelling on some days. She has increased ankle swelling recently also, increased after trip to the mountains 2 weeks ago.   Previous HPI 08/25/2022 Samantha Mcgrath is a 72 y.o. female here for follow up for ankylosing spondylitis on Humira 40 mg subcu q. 14 days.  She is now been on treatment for closer to 4 months is tolerating the medicine fine with no injection reaction problem.  She has not had any significant infections since last visit.  So far she is not sure how much the medication is helping with joint pain and stiffness.  Is not having severe back pain issues or awakening at night but still has some morning stiffness.  Only new joint issue is left ankle pain affecting the front and side of her ankle started since about 1  week ago.  Not associated with visible swelling or bruising.  She does not recall any specific injury associated with this and painful with movement also gets pain if she has any pressure on that area lying in bed at night.   Previous HPI 06/26/22 Samantha Mcgrath is a 72 y.o. female here for follow up for ankylosing spondylitis on Humira 40 mg [place] q14days. She felt an improvement with steroid course but is back to about the same amount of symptoms as before. She also notices some left thumb pain with the cold weather that is unusual for her. So far no difference since starting Humiea but only took 2 doses so far. No intolerance or problems either.    Previous  HPI 04/24/22 Samantha Mcgrath is a 72 y.o. female here for a second opinion and transfer of care previously saw Dr. Deanne Coffer in June for worsening chronic back and joint pain newly diagnosed with ankylosing spondylitis. She was recommended to start treatment with Humira for axial disease but did not feel safe doing so.  Evaluation included x-ray imaging and lab test at thought to be consistent based on abnormal inflammatory markers and syndesmophytes on lumbar spine x-ray Back pain has been an ongoing problem for years but has been more severe or limiting within the past year or 2.  She experiences pain notably when trying to lie flat in bed but also when standing or walking for prolonged times.  Gets morning stiffness lasting about 1 hour on average also becomes very stiff again if seated position for more than a few minutes.  Notices initial improvement once up and moving but gets increased pain again if walking for too long.  Does awaken her from sleep some nights.  She does not have issues with numbness or radiation of pain into the legs does not experience weakness and had no falls.  She has tried taking ibuprofen 800 mg 3 times daily or naproxen 500 mg twice daily which have only small benefit in her symptoms. She has diabetes and previous amputation of 2nd toe on right foot with osteomyelitis. Also history of gout started years ago in her right great toe but did not require long term medication. Started to have inflammation again and now on allopurinol 250 mg daily. Was previously on colchicine for treatment but off this without ongoing prophylaxis at this time.  She denies any history of inflammatory eye problems.  She experiences diarrhea occasionally no history of inflammatory bowel disease.   Labs reviewed ANA neg RF neg ESR 44 CRP 79   Review of Systems  Constitutional:  Negative for fatigue.  HENT:  Negative for mouth sores and mouth dryness.   Eyes:  Negative for dryness.  Respiratory:   Negative for shortness of breath.   Cardiovascular:  Positive for chest pain. Negative for palpitations.  Gastrointestinal:  Negative for blood in stool, constipation and diarrhea.  Endocrine: Negative for increased urination.  Genitourinary:  Positive for involuntary urination.  Musculoskeletal:  Positive for joint pain, joint pain, joint swelling, myalgias, muscle weakness, morning stiffness, muscle tenderness and myalgias. Negative for gait problem.  Skin:  Negative for color change, rash, hair loss and sensitivity to sunlight.  Allergic/Immunologic: Negative for susceptible to infections.  Neurological:  Negative for dizziness and headaches.  Hematological:  Negative for swollen glands.  Psychiatric/Behavioral:  Negative for depressed mood and sleep disturbance. The patient is not nervous/anxious.     PMFS History:  Patient Active Problem List   Diagnosis Date  Noted   Bilateral knee pain 11/27/2022   Osteoarthritis of left thumb 06/26/2022   Ankylosing spondylitis (HCC) 04/24/2022   High risk medication use 04/24/2022   Chronic gouty arthritis 02/23/2021   Osteomyelitis due to type 2 diabetes mellitus (HCC) 10/04/2020   Osteomyelitis of ankle or foot, acute, right (HCC) 10/04/2020   Chronic cough 01/06/2020   Chronic renal failure, stage 3a (HCC) 10/08/2019   Primary insomnia 03/28/2019   Primary osteoarthritis of left knee 12/24/2018   Combined forms of age-related cataract of right eye 08/15/2018   Gastroesophageal reflux disease without esophagitis 01/29/2018   Asthma 01/28/2018   Hyperlipidemia 01/28/2018   Type 2 diabetes mellitus without complication, with long-term current use of insulin (HCC) 01/28/2018   Retinal detachment of left eye with multiple breaks 11/14/2016   Morbid obesity (HCC) 08/17/2015   Postoperative wound dehiscence 08/17/2015   Ulcer of right leg, with fat layer exposed (HCC) 08/17/2015   Wound, surgical, infected, subsequent encounter 08/17/2015    Essential hypertension 06/01/2014    Past Medical History:  Diagnosis Date   Asthma    Bronchitis    Cataract    bilateral   Depression    Diabetes mellitus without complication (HCC)    GERD (gastroesophageal reflux disease)    Gout    Hyperlipidemia    Hypertension    Pneumonia    Restrictive airway disease    Retinal detachment    left    History reviewed. No pertinent family history. Past Surgical History:  Procedure Laterality Date   AMPUTATION TOE Right 10/06/2020   Procedure: AMPUTATION 2ND TOE;  Surgeon: Asencion Islam, DPM;  Location: [facility];  Service: Podiatry;  Laterality: Right;   AMPUTATION TOE Left 06/02/2021   Procedure: Left 2nd toe amputation;  Surgeon: Toni Arthurs, MD;  Location: [facility];  Service: Orthopedics;  Laterality: Left;   ANKLE SURGERY     BLADDER SURGERY     BREAST BIOPSY Left    EYE SURGERY     MELANOMA EXCISION     Of Right Leg   TONSILLECTOMY     TUBAL LIGATION     Social History   Social History Narrative   Diet: Blank      Do you drink/ eat things with caffeine? Yes      Marital status:  Divorced                             What year were you married ? 1969      Do you live in a house, apartment,assistred living, condo, trailer, etc.)? House      Is it one or more stories? One      How many persons live in your home ? 1      Do you have any pets in your home ?(please list) No      Highest Level of education completed: PHD      Current or past profession: Clergy      Do you exercise?   No                           Type & how often       ADVANCED DIRECTIVES (Please bring copies)      Do you have a living will? Yes      Do you have a DNR form?   Yes  If not, do you want to discuss one?       Do you have signed POA?HPOA forms?  Yes               If so, please bring to your appointment      FUNCTIONAL STATUS- To be completed by Spouse / child / Staff       Do you have difficulty  bathing or dressing yourself ? No      Do you have difficulty preparing food or eating ? No      Do you have difficulty managing your mediation ? No      Do you have difficulty managing your finances ? No      Do you have difficulty affording your medication ? No      Immunization History  Administered Date(s) Administered   Influenza Split 03/10/2011, 03/28/2012, 03/03/2014, 04/02/2015   Influenza, High Dose Seasonal PF 03/28/2019, 03/17/2020   Influenza,inj,Quad PF,6-35 Mos 03/19/2018   Influenza,trivalent, recombinat, inj, PF 03/03/2014   Influenza-Unspecified 03/10/2011, 03/28/2012   Moderna Sars-Covid-2 Vaccination 07/01/2019, 07/29/2019, 04/12/2020   Tdap 10/28/2014     Objective: Vital Signs: BP 137/80 (BP Location: Left Arm, Patient Position: Sitting, Cuff Size: Large)   Pulse 65   Resp 14   Ht 5\' 5"  (1.651 m)   Wt 227 lb (103 kg)   BMI 37.77 kg/m    Physical Exam Constitutional:      Appearance: She is obese.  Cardiovascular:     Rate and Rhythm: Normal rate and regular rhythm.  Pulmonary:     Effort: Pulmonary effort is normal.     Breath sounds: Normal breath sounds.  Musculoskeletal:     Right lower leg: No edema.     Left lower leg: No edema.  Skin:    General: Skin is warm and dry.     Findings: No rash.  Neurological:     Mental Status: She is alert.  Psychiatric:        Mood and Affect: Mood normal.      Musculoskeletal Exam:  Shoulders full ROM no tenderness or swelling Elbows full ROM no tenderness or swelling Wrists full ROM no tenderness or swelling Fingers full ROM no tenderness or swelling Tenderness lateral hip and over SI joint b/l, no midline low back pain Knees full ROM, tenderness to pressure over patellar tendon and positive patellar grind pain, no joint line tenderness, no palpable effusions   Investigation: No additional findings.  Imaging: No results found.  Recent Labs: Lab Results  Component Value Date   WBC 8.1  02/27/2023   HGB 13.6 02/27/2023   PLT 299 02/27/2023   NA 140 02/27/2023   K 4.0 02/27/2023   CL 104 02/27/2023   CO2 26 02/27/2023   GLUCOSE 169 (H) 02/27/2023   BUN 41 (H) 02/27/2023   CREATININE 1.30 (H) 02/27/2023   BILITOT 0.6 02/27/2023   ALKPHOS 66 10/05/2020   AST 11 02/27/2023   ALT 10 02/27/2023   PROT 6.6 02/27/2023   ALBUMIN 3.2 (L) 10/05/2020   CALCIUM 9.7 02/27/2023   QFTBGOLDPLUS NEGATIVE 04/24/2022    Speciality Comments: No specialty comments available.  Procedures:  No procedures performed Allergies: Procaine   Assessment / Plan:     Visit Diagnoses: Ankylosing spondylitis of multiple sites in spine (HCC) - Plan: Sedimentation rate Persistent lower back pain despite Humira treatment 40 mg [place] q14days. Some improvement in inflammation markers noted previously. -Check current inflammation markers. -Consider pausing Humira treatment  to assess its impact on symptoms especially if high. -If inflammation markers are high or symptoms worsen off Humira, consider alternative treatments such as Tremfya, Cosentyx, or Taltz.  High risk medication use - Humira 40 mg subcu q. 14 days - Plan: CBC with Differential/Platelet, COMPLETE METABOLIC PANEL WITH GFR  Chronic gouty arthritis - uric acid: 3.4 on 08/25/2022.  Chronic pain of both knees - S/P Large Joint Inj: bilateral knee on 02/27/2023 - Plan: Ambulatory referral to Physical Therapy Worsening pain in both knees, more severe in the left knee. Previous injections provided temporary relief. Patellofemoral compartment osteoarthritis identified on x-ray. -Refer to physical therapy for strengthening exercises and fall prevention. -Reassess need for further injections or surgical intervention based on response to physical therapy.  Hammer Toes Scheduled for toe removal surgery in January -Agree with planned surgery. -Post-surgery, assess impact on physical therapy for knee osteoarthritis.  General Health  Maintenance Recent course of antibiotics for bronchitis. Scheduled for a CT scan of the lungs. -Continue with planned CT scan. -Follow up on results and any necessary treatment.    Orders: Orders Placed This Encounter  Procedures   Sedimentation rate   CBC with Differential/Platelet   COMPLETE METABOLIC PANEL WITH GFR   Ambulatory referral to Physical Therapy   No orders of the defined types were placed in this encounter.    Follow-Up Instructions: Return in about 3 months (around 08/29/2023) for AS/OA ?ADA f/u 3mos.   Fuller Plan, MD  Note - This record has been created using [employer].  Chart creation errors have been sought, but may not always  have been located. Such creation errors do not reflect on  the standard of medical care.

## 2023-05-23 ENCOUNTER — Telehealth: Payer: Self-pay | Admitting: Pharmacist

## 2023-05-23 NOTE — Telephone Encounter (Signed)
Received notification from [facility] regarding a prior authorization for HUMIRA. Authorization has been APPROVED from 05/23/23 to 06/05/23. Approval letter sent to scan center.

## 2023-05-23 NOTE — Telephone Encounter (Signed)
Patient portion of Humira PAP renewal application mailed to patient.  Provider portion placed in Dr. Gregary Cromer folder for signature  Submitted a Prior Authorization request to [facility] for HUMIRA via CoverMyMeds. Will update once we receive a response.  Key: [id number]   Chesley Mires, PharmD, MPH, BCPS, CPP Clinical Pharmacist (Rheumatology and Pulmonology)

## 2023-05-28 NOTE — Telephone Encounter (Signed)
Received signed provider form. Provider form, PA approval, insurance card copy, and med list scanned to [employer]

## 2023-05-31 ENCOUNTER — Ambulatory Visit
Admission: RE | Admit: 2023-05-31 | Discharge: 2023-05-31 | Disposition: A | Discharge status: Home / Self Care | Payer: Medicare HMO | Source: Ambulatory Visit | Attending: Family Medicine | Admitting: Family Medicine

## 2023-05-31 ENCOUNTER — Ambulatory Visit: Payer: Medicare HMO | Attending: Internal Medicine | Admitting: Internal Medicine

## 2023-05-31 ENCOUNTER — Encounter: Payer: Self-pay | Admitting: Internal Medicine

## 2023-05-31 VITALS — BP 137/80 | HR 65 | Resp 14 | Ht 65.0 in | Wt 227.0 lb

## 2023-05-31 DIAGNOSIS — M450 Ankylosing spondylitis of multiple sites in spine: Secondary | ICD-10-CM

## 2023-05-31 DIAGNOSIS — M25562 Pain in left knee: Secondary | ICD-10-CM

## 2023-05-31 DIAGNOSIS — R053 Chronic cough: Secondary | ICD-10-CM

## 2023-05-31 DIAGNOSIS — M1A00X0 Idiopathic chronic gout, unspecified site, without tophus (tophi): Secondary | ICD-10-CM | POA: Diagnosis not present

## 2023-05-31 DIAGNOSIS — G8929 Other chronic pain: Secondary | ICD-10-CM | POA: Diagnosis not present

## 2023-05-31 DIAGNOSIS — M25561 Pain in right knee: Secondary | ICD-10-CM | POA: Diagnosis not present

## 2023-05-31 DIAGNOSIS — Z79899 Other long term (current) drug therapy: Secondary | ICD-10-CM | POA: Diagnosis not present

## 2023-05-31 DIAGNOSIS — M1812 Unilateral primary osteoarthritis of first carpometacarpal joint, left hand: Secondary | ICD-10-CM

## 2023-05-31 DIAGNOSIS — I700 Atherosclerosis of aorta: Secondary | ICD-10-CM | POA: Diagnosis not present

## 2023-05-31 DIAGNOSIS — I2510 Atherosclerotic heart disease of native coronary artery without angina pectoris: Secondary | ICD-10-CM | POA: Diagnosis not present

## 2023-05-31 MED ORDER — IOPAMIDOL (ISOVUE-300) INJECTION 61%
500.0000 mL | Freq: Once | INTRAVENOUS | Status: AC | PRN
  Administered 2023-05-31: 75 mL via INTRAVENOUS

## 2023-06-01 ENCOUNTER — Ambulatory Visit
Admission: RE | Admit: 2023-06-01 | Discharge: 2023-06-01 | Disposition: A | Discharge status: Home / Self Care | Payer: Medicare HMO | Source: Ambulatory Visit | Attending: Family Medicine | Admitting: Family Medicine

## 2023-06-01 DIAGNOSIS — M8588 Other specified disorders of bone density and structure, other site: Secondary | ICD-10-CM | POA: Diagnosis not present

## 2023-06-01 DIAGNOSIS — E2839 Other primary ovarian failure: Secondary | ICD-10-CM | POA: Diagnosis not present

## 2023-06-01 DIAGNOSIS — M8580 Other specified disorders of bone density and structure, unspecified site: Secondary | ICD-10-CM

## 2023-06-01 DIAGNOSIS — N958 Other specified menopausal and perimenopausal disorders: Secondary | ICD-10-CM | POA: Diagnosis not present

## 2023-06-01 LAB — CBC WITH DIFFERENTIAL/PLATELET
Absolute Lymphocytes: 2195 {cells}/uL (ref 850–3900)
Absolute Monocytes: 886 {cells}/uL (ref 200–950)
Basophils Absolute: 100 {cells}/uL (ref 0–200)
Basophils Relative: 1.3 %
Eosinophils Absolute: 408 {cells}/uL (ref 15–500)
Eosinophils Relative: 5.3 %
HCT: 47 % — ABNORMAL HIGH (ref 35.0–45.0)
Hemoglobin: 15.1 g/dL (ref 11.7–15.5)
MCH: 30.8 pg (ref 27.0–33.0)
MCHC: 32.1 g/dL (ref 32.0–36.0)
MCV: 95.7 fL (ref 80.0–100.0)
MPV: 10.9 fL (ref 7.5–12.5)
Monocytes Relative: 11.5 %
Neutro Abs: 4112 {cells}/uL (ref 1500–7800)
Neutrophils Relative %: 53.4 %
Platelets: 319 10*3/uL (ref 140–400)
RBC: 4.91 10*6/uL (ref 3.80–5.10)
RDW: 12.8 % (ref 11.0–15.0)
Total Lymphocyte: 28.5 %
WBC: 7.7 10*3/uL (ref 3.8–10.8)

## 2023-06-01 LAB — SEDIMENTATION RATE: Sed Rate: 19 mm/h (ref 0–30)

## 2023-06-01 LAB — COMPLETE METABOLIC PANEL WITH GFR
AG Ratio: 1.4 (calc) (ref 1.0–2.5)
ALT: 12 U/L (ref 6–29)
AST: 12 U/L (ref 10–35)
Albumin: 4.2 g/dL (ref 3.6–5.1)
Alkaline phosphatase (APISO): 104 U/L (ref 37–153)
BUN/Creatinine Ratio: 27 (calc) — ABNORMAL HIGH (ref 6–22)
BUN: 35 mg/dL — ABNORMAL HIGH (ref 7–25)
CO2: 30 mmol/L (ref 20–32)
Calcium: 10 mg/dL (ref 8.6–10.4)
Chloride: 101 mmol/L (ref 98–110)
Creat: 1.3 mg/dL — ABNORMAL HIGH (ref 0.60–1.00)
Globulin: 3 g/dL (ref 1.9–3.7)
Glucose, Bld: 125 mg/dL — ABNORMAL HIGH (ref 65–99)
Potassium: 4.4 mmol/L (ref 3.5–5.3)
Sodium: 141 mmol/L (ref 135–146)
Total Bilirubin: 0.4 mg/dL (ref 0.2–1.2)
Total Protein: 7.2 g/dL (ref 6.1–8.1)
eGFR: 43 mL/min/{1.73_m2} — ABNORMAL LOW (ref >=60)

## 2023-06-01 NOTE — Progress Notes (Signed)
Sedimentation rate remains normal at 19.  Blood count is normal.  Her kidney function is stable with estimated GFR of 43 the same as last time. I recommend she try stopping the Humira for now and monitor her symptoms.  If there is a noticeable increase in symptoms we could try switching to a different spondylarthritis medicine.  Otherwise we could follow-up to recheck her exam and inflammation numbers at our follow-up appointment.

## 2023-06-01 NOTE — Telephone Encounter (Signed)
Per OV note from yesterday, Dr. Dimple Casey is recommending patient stop Humira for now and they will re-assess disease activity at f/u visit. Closing this encounter.

## 2023-06-12 DIAGNOSIS — J45909 Unspecified asthma, uncomplicated: Secondary | ICD-10-CM | POA: Diagnosis not present

## 2023-06-12 DIAGNOSIS — I10 Essential (primary) hypertension: Secondary | ICD-10-CM | POA: Diagnosis not present

## 2023-06-12 DIAGNOSIS — M2042 Other hammer toe(s) (acquired), left foot: Secondary | ICD-10-CM | POA: Diagnosis not present

## 2023-06-12 DIAGNOSIS — S93145A Subluxation of metatarsophalangeal joint of left lesser toe(s), initial encounter: Secondary | ICD-10-CM | POA: Diagnosis not present

## 2023-06-12 DIAGNOSIS — K219 Gastro-esophageal reflux disease without esophagitis: Secondary | ICD-10-CM | POA: Diagnosis not present

## 2023-06-12 DIAGNOSIS — E119 Type 2 diabetes mellitus without complications: Secondary | ICD-10-CM | POA: Diagnosis not present

## 2023-06-20 DIAGNOSIS — Z1330 Encounter for screening examination for mental health and behavioral disorders, unspecified: Secondary | ICD-10-CM | POA: Diagnosis not present

## 2023-06-20 DIAGNOSIS — Z4789 Encounter for other orthopedic aftercare: Secondary | ICD-10-CM | POA: Diagnosis not present

## 2023-06-20 DIAGNOSIS — M2042 Other hammer toe(s) (acquired), left foot: Secondary | ICD-10-CM | POA: Diagnosis not present

## 2023-07-06 DIAGNOSIS — Z9889 Other specified postprocedural states: Secondary | ICD-10-CM | POA: Diagnosis not present

## 2023-07-06 DIAGNOSIS — M2042 Other hammer toe(s) (acquired), left foot: Secondary | ICD-10-CM | POA: Diagnosis not present

## 2023-08-06 ENCOUNTER — Institutional Professional Consult (permissible substitution): Payer: Medicare HMO | Admitting: Internal Medicine

## 2023-08-15 NOTE — Progress Notes (Signed)
 Office Visit Note  Patient: Samantha Mcgrath             Date of Birth: [birth year]           MRN: [id number]             PCP: Aliene Beams, MD Referring: Aliene Beams, MD Visit Date: 08/29/2023   Subjective:  Follow-up   Discussed the use of AI scribe software for clinical note transcription with the patient, who gave verbal consent to proceed.  History of Present Illness   Samantha Mcgrath is a 73 year old female with osteoarthritis and ankylosing spondylitis off humira since last visit who presents with worsening joint pain and stiffness.  She experiences significant pain in her knees and thumbs, describing them as hurting constantly. Her fingers occasionally 'freeze,' preventing her from releasing objects due to stiffness, a symptom that has developed over the past five to six months.  She has a history of knee injections, with the first injection in June of the previous year providing significant relief, described as 'a miracle.' However, a subsequent injection was less effective, and she has not had any additional injections since then. Her left knee is particularly problematic, causing pain that disrupts her sleep. She describes a sensation of something 'poking' in the knee, especially at night, and notes that the left knee is usually worse than the right. She experiences occasional leg swelling, particularly after attending events like the annual conference at [facility].  Her back pain, which initially brought her to seek medical care, is stable and possibly improved. She is currently off Humira and monitoring her symptoms. She notes that her back pain is less noticeable due to increased pain in other areas.  She underwent a toe amputation in January, which has improved her ability to walk without complications. Despite this, her knees continue to cause significant discomfort.  Her son was diagnosed with cancer in November, and she has moved back to [place] to assist  with his care, which has added stress to her life. She is retiring at the end of June and has been physically active with moving boxes, which may exacerbate her symptoms. She reports poor sleep, primarily due to knee pain and worry about her son's health. She notes that her back does not bother her at night, but her knees do.  She has been using her hands extensively, particularly on the computer, which may contribute to her thumb pain. She has not used any supportive gloves or braces for her thumbs yet.     Previous HPI 05/31/2023 Samantha Mcgrath is a 73 y.o. female here for follow up for ankylosing spondylitis on Humira 40 mg subcu q. 14 days.  She presents with worsening bilateral knee pain. The pain is worse at night and when rising from a seated position, but improves with movement. The patient reports that the left knee is more painful, while the right knee swells more, sometimes extending into the leg. The patient also experiences sporadic instability in the right knee, causing fear of falling.   In June, the patient received bilateral knee injections which provided significant relief; however, a repeat injection in October did not yield the same benefit. The patient is unable to take NSAIDs and is not currently on any pain relief medication.   The patient also reports ongoing lower back pain, possibly related to her spondylitis. The pain is located in the lower back or possibly the right hip, and is worse when standing  in one place for extended periods. The patient also notes pain in both thumbs.   The patient is currently on Humira for inflammation but reports minimal relief in knee and back pain. The patient also has a scheduled toe amputation in January due to hammer toes.    Previous HPI 02/27/2023 Samantha Mcgrath is a 73 y.o. female here for follow up for ankylosing spondylitis on Humira 40 mg subcu q. 14 days.  Knee pain significantly improved after bilateral steroid injections last  visit benefit has worn off during the past 1 month.  Hand pain is worse at the left thumb this is usually more provoked with use.  She feels occasional knee buckling type sensation but does not specifically get locked in place and has not had any falls.   Previous HPI 11/27/2022 Samantha Mcgrath is a 73 y.o. female here for follow up for ankylosing spondylitis on Humira 40 mg subcu q. 14 days.  Joint pain is doing okay in some areas but has left thumb and bilateral knee pain. Knee pain is especially bad with any deep bending. She cannot get up from the floor without assistance or severe pain, also climbing stairs has to lead with just one side. Does see visible swelling on some days. She has increased ankle swelling recently also, increased after trip to the mountains 2 weeks ago.   Previous HPI 08/25/2022 Samantha Mcgrath is a 73 y.o. female here for follow up for ankylosing spondylitis on Humira 40 mg subcu q. 14 days.  She is now been on treatment for closer to 4 months is tolerating the medicine fine with no injection reaction problem.  She has not had any significant infections since last visit.  So far she is not sure how much the medication is helping with joint pain and stiffness.  Is not having severe back pain issues or awakening at night but still has some morning stiffness.  Only new joint issue is left ankle pain affecting the front and side of her ankle started since about 1 week ago.  Not associated with visible swelling or bruising.  She does not recall any specific injury associated with this and painful with movement also gets pain if she has any pressure on that area lying in bed at night.   Previous HPI 06/26/22 Samantha Mcgrath is a 73 y.o. female here for follow up for ankylosing spondylitis on Humira 40 mg [place] q14days. She felt an improvement with steroid course but is back to about the same amount of symptoms as before. She also notices some left thumb pain with the cold weather that is  unusual for her. So far no difference since starting Humiea but only took 2 doses so far. No intolerance or problems either.    Previous HPI 04/24/22 Samantha Mcgrath is a 73 y.o. female here for a second opinion and transfer of care previously saw Dr. Deanne Coffer in June for worsening chronic back and joint pain newly diagnosed with ankylosing spondylitis. She was recommended to start treatment with Humira for axial disease but did not feel safe doing so.  Evaluation included x-ray imaging and lab test at thought to be consistent based on abnormal inflammatory markers and syndesmophytes on lumbar spine x-ray Back pain has been an ongoing problem for years but has been more severe or limiting within the past year or 2.  She experiences pain notably when trying to lie flat in bed but also when standing or walking for prolonged times.  Gets morning stiffness lasting about 1 hour on average also becomes very stiff again if seated position for more than a few minutes.  Notices initial improvement once up and moving but gets increased pain again if walking for too long.  Does awaken her from sleep some nights.  She does not have issues with numbness or radiation of pain into the legs does not experience weakness and had no falls.  She has tried taking ibuprofen 800 mg 3 times daily or naproxen 500 mg twice daily which have only small benefit in her symptoms. She has diabetes and previous amputation of 2nd toe on right foot with osteomyelitis. Also history of gout started years ago in her right great toe but did not require long term medication. Started to have inflammation again and now on allopurinol 250 mg daily. Was previously on colchicine for treatment but off this without ongoing prophylaxis at this time.  She denies any history of inflammatory eye problems.  She experiences diarrhea occasionally no history of inflammatory bowel disease.   Labs reviewed ANA neg RF neg ESR 44 CRP 79   Review of Systems   Constitutional:  Positive for fatigue.  HENT:  Negative for mouth sores and mouth dryness.   Eyes:  Negative for dryness.  Respiratory:  Negative for shortness of breath.   Cardiovascular:  Negative for chest pain and palpitations.  Gastrointestinal:  Negative for blood in stool, constipation and diarrhea.  Endocrine: Negative for increased urination.  Genitourinary:  Positive for involuntary urination.  Musculoskeletal:  Positive for joint pain, joint pain, joint swelling and morning stiffness. Negative for gait problem, myalgias, muscle weakness, muscle tenderness and myalgias.  Skin:  Negative for color change, rash, hair loss and sensitivity to sunlight.  Allergic/Immunologic: Negative for susceptible to infections.  Neurological:  Negative for dizziness and headaches.  Hematological:  Negative for swollen glands.  Psychiatric/Behavioral:  Positive for sleep disturbance. Negative for depressed mood. The patient is not nervous/anxious.     PMFS History:  Patient Active Problem List   Diagnosis Date Noted   Bilateral knee pain 11/27/2022   Osteoarthritis of left thumb 06/26/2022   Ankylosing spondylitis (HCC) 04/24/2022   High risk medication use 04/24/2022   Chronic gouty arthritis 02/23/2021   Osteomyelitis due to type 2 diabetes mellitus (HCC) 10/04/2020   Osteomyelitis of ankle or foot, acute, right (HCC) 10/04/2020   Chronic cough 01/06/2020   Chronic renal failure, stage 3a (HCC) 10/08/2019   Primary insomnia 03/28/2019   Primary osteoarthritis of left knee 12/24/2018   Combined forms of age-related cataract of right eye 08/15/2018   Gastroesophageal reflux disease without esophagitis 01/29/2018   Asthma 01/28/2018   Hyperlipidemia 01/28/2018   Type 2 diabetes mellitus without complication, with long-term current use of insulin (HCC) 01/28/2018   Retinal detachment of left eye with multiple breaks 11/14/2016   Morbid obesity (HCC) 08/17/2015   Postoperative wound  dehiscence 08/17/2015   Ulcer of right leg, with fat layer exposed (HCC) 08/17/2015   Wound, surgical, infected, subsequent encounter 08/17/2015   Essential hypertension 06/01/2014    Past Medical History:  Diagnosis Date   Asthma    Bronchitis    Cataract    bilateral   Depression    Diabetes mellitus without complication (HCC)    GERD (gastroesophageal reflux disease)    Gout    Hyperlipidemia    Hypertension    Pneumonia    Restrictive airway disease    Retinal detachment    left  Family History  Problem Relation Age of Onset   Cancer Son    Past Surgical History:  Procedure Laterality Date   AMPUTATION TOE Right 10/06/2020   Procedure: AMPUTATION 2ND TOE;  Surgeon: Asencion Islam, DPM;  Location: [facility];  Service: Podiatry;  Laterality: Right;   AMPUTATION TOE Left 06/02/2021   Procedure: Left 2nd toe amputation;  Surgeon: Toni Arthurs, MD;  Location: [facility];  Service: Orthopedics;  Laterality: Left;   ANKLE SURGERY     BLADDER SURGERY     BREAST BIOPSY Left    EYE SURGERY     MELANOMA EXCISION     Of Right Leg   TONSILLECTOMY     TUBAL LIGATION     Social History   Social History Narrative   Diet: Blank      Do you drink/ eat things with caffeine? Yes      Marital status:  Divorced                             What year were you married ? 1969      Do you live in a house, apartment,assistred living, condo, trailer, etc.)? House      Is it one or more stories? One      How many persons live in your home ? 1      Do you have any pets in your home ?(please list) No      Highest Level of education completed: PHD      Current or past profession: Clergy      Do you exercise?   No                           Type & how often       ADVANCED DIRECTIVES (Please bring copies)      Do you have a living will? Yes      Do you have a DNR form?   Yes                   If not, do you want to discuss one?       Do you have signed POA?HPOA  forms?  Yes               If so, please bring to your appointment      FUNCTIONAL STATUS- To be completed by Spouse / child / Staff       Do you have difficulty bathing or dressing yourself ? No      Do you have difficulty preparing food or eating ? No      Do you have difficulty managing your mediation ? No      Do you have difficulty managing your finances ? No      Do you have difficulty affording your medication ? No      Immunization History  Administered Date(s) Administered   Influenza Split 03/10/2011, 03/28/2012, 03/03/2014, 04/02/2015   Influenza, High Dose Seasonal PF 03/28/2019, 03/17/2020   Influenza,inj,Quad PF,6-35 Mos 03/19/2018   Influenza,trivalent, recombinat, inj, PF 03/03/2014   Influenza-Unspecified 03/10/2011, 03/28/2012   Moderna Sars-Covid-2 Vaccination 07/01/2019, 07/29/2019, 04/12/2020   Tdap 10/28/2014     Objective: Vital Signs: BP 132/80 (BP Location: Left Arm, Patient Position: Sitting, Cuff Size: Large)   Pulse 69   Resp 14   Ht 5\' 5"  (1.651 m)   Wt 224  lb (101.6 kg)   BMI 37.28 kg/m    Physical Exam Constitutional:      Appearance: She is obese.  Cardiovascular:     Rate and Rhythm: Normal rate and regular rhythm.  Pulmonary:     Effort: Pulmonary effort is normal.     Breath sounds: Normal breath sounds.  Skin:    General: Skin is warm and dry.  Neurological:     Mental Status: She is alert.  Psychiatric:        Mood and Affect: Mood normal.      Musculoskeletal Exam:  Shoulders full ROM no tenderness or swelling Elbows full ROM no tenderness or swelling Wrists full ROM no tenderness or swelling Fingers full ROM no tenderness or swelling Tenderness lateral hip and over SI joint b/l, no midline low back pain Knees full ROM, tenderness to pressure over patellar tendon and positive patellar grind pain, some anterior joint line tenderness, no palpable effusions  Investigation: No additional findings.  Imaging: No results  found.  Recent Labs: Lab Results  Component Value Date   WBC 7.7 05/31/2023   HGB 15.1 05/31/2023   PLT 319 05/31/2023   NA 141 05/31/2023   K 4.4 05/31/2023   CL 101 05/31/2023   CO2 30 05/31/2023   GLUCOSE 125 (H) 05/31/2023   BUN 35 (H) 05/31/2023   CREATININE 1.30 (H) 05/31/2023   BILITOT 0.4 05/31/2023   ALKPHOS 66 10/05/2020   AST 12 05/31/2023   ALT 12 05/31/2023   PROT 7.2 05/31/2023   ALBUMIN 3.2 (L) 10/05/2020   CALCIUM 10.0 05/31/2023   QFTBGOLDPLUS NEGATIVE 04/24/2022    Speciality Comments: No specialty comments available.  Procedures:  Large Joint Inj: bilateral knee on 08/29/2023 11:23 AM Indications: pain Details: 27 G 1.5 in needle, anteromedial approach Medications (Right): 2 mL lidocaine 1 %; 40 mg triamcinolone acetonide 40 MG/ML Medications (Left): 2 mL lidocaine 1 %; 40 mg triamcinolone acetonide 40 MG/ML Outcome: tolerated well, no immediate complications Procedure, treatment alternatives, risks and benefits explained, specific risks discussed. Consent was given by the patient. Immediately prior to procedure a time out was called to verify the correct patient, procedure, equipment, support staff and site/side marked as required. Patient was prepped and draped in the usual sterile fashion.     Allergies: Procaine   Assessment / Plan:     Visit Diagnoses: Ankylosing spondylitis of multiple sites in spine (HCC) - If inflammation markers are high or symptoms worsen off Humira, consider alternative treatments such as Tremfya, Cosentyx, or Taltz. - Plan: Sedimentation rate, C-reactive protein Axial spondyloarthritis with previous Humira treatment. Currently off medication. Back pain improves with rest. Plan to assess disease activity to determine need for re-initiation of medication like Cosentyx or Tremfya. - Order lab tests to assess inflammatory markers and activity.   Chronic osteoarthritis in knees and thumbs causing significant pain and functional  impairment. Left knee and thumb are particularly affected. Previous knee injections provided temporary relief. Pain is due to osteoarthritis, not inflammatory joint disease. Considering viscosupplementation for longer-lasting relief. - Administer bilateral knee injections for OA pain management. - Consider viscosupplementation if knee steroid injections are ineffective or very limited duration of benefit. - Provide information on osteoarthritis gloves for thumb support. - Consider referral to occupational therapy for a fitted thumb support brace.      Orders: Orders Placed This Encounter  Procedures   Large Joint Inj: bilateral knee   Sedimentation rate   C-reactive protein   No orders of  the defined types were placed in this encounter.    Follow-Up Instructions: Return in about 3 months (around 11/29/2023) for OA/AS inj f/u 3mos.   Fuller Plan, MD  Note - This record has been created using [employer].  Chart creation errors have been sought, but may not always  have been located. Such creation errors do not reflect on  the standard of medical care.

## 2023-08-28 DIAGNOSIS — Z86006 Personal history of melanoma in-situ: Secondary | ICD-10-CM | POA: Diagnosis not present

## 2023-08-28 DIAGNOSIS — L814 Other melanin hyperpigmentation: Secondary | ICD-10-CM | POA: Diagnosis not present

## 2023-08-28 DIAGNOSIS — M341 CR(E)ST syndrome: Secondary | ICD-10-CM | POA: Diagnosis not present

## 2023-08-28 DIAGNOSIS — L570 Actinic keratosis: Secondary | ICD-10-CM | POA: Diagnosis not present

## 2023-08-28 DIAGNOSIS — L821 Other seborrheic keratosis: Secondary | ICD-10-CM | POA: Diagnosis not present

## 2023-08-28 DIAGNOSIS — Z08 Encounter for follow-up examination after completed treatment for malignant neoplasm: Secondary | ICD-10-CM | POA: Diagnosis not present

## 2023-08-28 DIAGNOSIS — D2272 Melanocytic nevi of left lower limb, including hip: Secondary | ICD-10-CM | POA: Diagnosis not present

## 2023-08-28 DIAGNOSIS — X32XXXA Exposure to sunlight, initial encounter: Secondary | ICD-10-CM | POA: Diagnosis not present

## 2023-08-28 DIAGNOSIS — D225 Melanocytic nevi of trunk: Secondary | ICD-10-CM | POA: Diagnosis not present

## 2023-08-28 DIAGNOSIS — Z7189 Other specified counseling: Secondary | ICD-10-CM | POA: Diagnosis not present

## 2023-08-28 DIAGNOSIS — Z85828 Personal history of other malignant neoplasm of skin: Secondary | ICD-10-CM | POA: Diagnosis not present

## 2023-08-29 ENCOUNTER — Ambulatory Visit: Payer: Medicare HMO | Attending: Internal Medicine | Admitting: Internal Medicine

## 2023-08-29 ENCOUNTER — Encounter: Payer: Self-pay | Admitting: Internal Medicine

## 2023-08-29 VITALS — BP 132/80 | HR 69 | Resp 14 | Ht 65.0 in | Wt 224.0 lb

## 2023-08-29 DIAGNOSIS — G8929 Other chronic pain: Secondary | ICD-10-CM

## 2023-08-29 DIAGNOSIS — Z79899 Other long term (current) drug therapy: Secondary | ICD-10-CM

## 2023-08-29 DIAGNOSIS — M25562 Pain in left knee: Secondary | ICD-10-CM

## 2023-08-29 DIAGNOSIS — M1A00X0 Idiopathic chronic gout, unspecified site, without tophus (tophi): Secondary | ICD-10-CM

## 2023-08-29 DIAGNOSIS — M25561 Pain in right knee: Secondary | ICD-10-CM

## 2023-08-29 DIAGNOSIS — M450 Ankylosing spondylitis of multiple sites in spine: Secondary | ICD-10-CM

## 2023-08-30 LAB — C-REACTIVE PROTEIN: CRP: <3 mg/L (ref <8.0)

## 2023-08-30 LAB — SEDIMENTATION RATE: Sed Rate: 29 mm/h (ref 0–30)

## 2023-08-31 DIAGNOSIS — H59812 Chorioretinal scars after surgery for detachment, left eye: Secondary | ICD-10-CM | POA: Diagnosis not present

## 2023-08-31 DIAGNOSIS — H40013 Open angle with borderline findings, low risk, bilateral: Secondary | ICD-10-CM | POA: Diagnosis not present

## 2023-08-31 DIAGNOSIS — E119 Type 2 diabetes mellitus without complications: Secondary | ICD-10-CM | POA: Diagnosis not present

## 2023-08-31 DIAGNOSIS — H43812 Vitreous degeneration, left eye: Secondary | ICD-10-CM | POA: Diagnosis not present

## 2023-08-31 DIAGNOSIS — H18593 Other hereditary corneal dystrophies, bilateral: Secondary | ICD-10-CM | POA: Diagnosis not present

## 2023-08-31 DIAGNOSIS — H26492 Other secondary cataract, left eye: Secondary | ICD-10-CM | POA: Diagnosis not present

## 2023-08-31 DIAGNOSIS — H02831 Dermatochalasis of right upper eyelid: Secondary | ICD-10-CM | POA: Diagnosis not present

## 2023-08-31 DIAGNOSIS — H35372 Puckering of macula, left eye: Secondary | ICD-10-CM | POA: Diagnosis not present

## 2023-08-31 DIAGNOSIS — H02834 Dermatochalasis of left upper eyelid: Secondary | ICD-10-CM | POA: Diagnosis not present

## 2023-08-31 DIAGNOSIS — H526 Other disorders of refraction: Secondary | ICD-10-CM | POA: Diagnosis not present

## 2023-08-31 DIAGNOSIS — Z961 Presence of intraocular lens: Secondary | ICD-10-CM | POA: Diagnosis not present

## 2023-09-03 MED ORDER — LIDOCAINE HCL 1 % IJ SOLN
2.0000 mL | INTRAMUSCULAR | Status: AC | PRN
  Administered 2023-08-29: 2 mL

## 2023-09-03 MED ORDER — TRIAMCINOLONE ACETONIDE 40 MG/ML IJ SUSP
40.0000 mg | INTRAMUSCULAR | Status: AC | PRN
  Administered 2023-08-29: 40 mg via INTRA_ARTICULAR

## 2023-09-12 DIAGNOSIS — M159 Polyosteoarthritis, unspecified: Secondary | ICD-10-CM | POA: Diagnosis not present

## 2023-09-12 DIAGNOSIS — I10 Essential (primary) hypertension: Secondary | ICD-10-CM | POA: Diagnosis not present

## 2023-09-12 DIAGNOSIS — Z9109 Other allergy status, other than to drugs and biological substances: Secondary | ICD-10-CM | POA: Diagnosis not present

## 2023-09-12 DIAGNOSIS — E1169 Type 2 diabetes mellitus with other specified complication: Secondary | ICD-10-CM | POA: Diagnosis not present

## 2023-09-12 DIAGNOSIS — E538 Deficiency of other specified B group vitamins: Secondary | ICD-10-CM | POA: Diagnosis not present

## 2023-09-12 DIAGNOSIS — J4521 Mild intermittent asthma with (acute) exacerbation: Secondary | ICD-10-CM | POA: Diagnosis not present

## 2023-09-12 DIAGNOSIS — E7800 Pure hypercholesterolemia, unspecified: Secondary | ICD-10-CM | POA: Diagnosis not present

## 2023-09-12 DIAGNOSIS — J40 Bronchitis, not specified as acute or chronic: Secondary | ICD-10-CM | POA: Diagnosis not present

## 2023-09-12 DIAGNOSIS — J329 Chronic sinusitis, unspecified: Secondary | ICD-10-CM | POA: Diagnosis not present

## 2023-09-12 DIAGNOSIS — M459 Ankylosing spondylitis of unspecified sites in spine: Secondary | ICD-10-CM | POA: Diagnosis not present

## 2023-10-12 DIAGNOSIS — I10 Essential (primary) hypertension: Secondary | ICD-10-CM | POA: Diagnosis not present

## 2023-10-12 DIAGNOSIS — E785 Hyperlipidemia, unspecified: Secondary | ICD-10-CM | POA: Diagnosis not present

## 2023-10-12 DIAGNOSIS — N3941 Urge incontinence: Secondary | ICD-10-CM | POA: Diagnosis not present

## 2023-10-12 DIAGNOSIS — M459 Ankylosing spondylitis of unspecified sites in spine: Secondary | ICD-10-CM | POA: Diagnosis not present

## 2023-10-12 DIAGNOSIS — R110 Nausea: Secondary | ICD-10-CM | POA: Diagnosis not present

## 2023-10-12 DIAGNOSIS — E11628 Type 2 diabetes mellitus with other skin complications: Secondary | ICD-10-CM | POA: Diagnosis not present

## 2023-10-26 DIAGNOSIS — E1122 Type 2 diabetes mellitus with diabetic chronic kidney disease: Secondary | ICD-10-CM | POA: Diagnosis not present

## 2023-10-26 DIAGNOSIS — N1832 Chronic kidney disease, stage 3b: Secondary | ICD-10-CM | POA: Diagnosis not present

## 2023-11-16 NOTE — Progress Notes (Signed)
 Office Visit Note  Patient: Samantha Mcgrath             Date of Birth: September 25, 1950           MRN: [id number]             PCP: Rolinda Millman, MD Referring: Rolinda Millman, MD Visit Date: 11/29/2023   Subjective:  Follow-up    Discussed the use of AI scribe software for clinical note transcription with the patient, who gave verbal consent to proceed.  History of Present Illness   Samantha Mcgrath is a 73 y.o. female here for follow up with osteoarthritis and ankylosing spondylitis off humira  since last visit who presents with worsening joint pain and stiffness. She presents with worsening back pain.  She has been experiencing significant worsening of her back pain, which she attributes to her known diagnosis of ankylosing spondylitis. The pain has become more bothersome recently. No mobility issues are reported, but the back pain is significant.  She has a history of receiving knee injections for osteoarthritis, which provided relief for about two months. These injections were administered in September of last year and again in March of this year. Imaging had not shown a severe loss of the joint space suggesting inflammation is contributing beyond her degree of OA.  She experiences persistent pain in her left thumb, which she attributes to osteoarthritis. The pain affects her dominant hand, and she uses compression gloves for relief. Her hands tend to freeze up when exposed to cold air, such as from air conditioning while driving. Being left-handed exacerbates the issue.   Previous HPI 08/29/2023 Samantha Mcgrath is a 73 year old female with osteoarthritis and ankylosing spondylitis off humira  since last visit who presents with worsening joint pain and stiffness.   She experiences significant pain in her knees and thumbs, describing them as hurting constantly. Her fingers occasionally 'freeze,' preventing her from releasing objects due to stiffness, a symptom that has developed over the  past five to six months.   She has a history of knee injections, with the first injection in June of the previous year providing significant relief, described as 'a miracle.' However, a subsequent injection was less effective, and she has not had any additional injections since then. Her left knee is particularly problematic, causing pain that disrupts her sleep. She describes a sensation of something 'poking' in the knee, especially at night, and notes that the left knee is usually worse than the right. She experiences occasional leg swelling, particularly after attending events like the annual conference at [facility].   Her back pain, which initially brought her to seek medical care, is stable and possibly improved. She is currently off Humira  and monitoring her symptoms. She notes that her back pain is less noticeable due to increased pain in other areas.   She underwent a toe amputation in January, which has improved her ability to walk without complications. Despite this, her knees continue to cause significant discomfort.   Her son was diagnosed with cancer in November, and she has moved back to [place] to assist with his care, which has added stress to her life. She is retiring at the end of June and has been physically active with moving boxes, which may exacerbate her symptoms. She reports poor sleep, primarily due to knee pain and worry about her son's health. She notes that her back does not bother her at night, but her knees do.   She has been using her  hands extensively, particularly on the computer, which may contribute to her thumb pain. She has not used any supportive gloves or braces for her thumbs yet.        Previous HPI 05/31/2023 Samantha Mcgrath is a 73 y.o. female here for follow up for ankylosing spondylitis on Humira  40 mg subcu q. 14 days.  She presents with worsening bilateral knee pain. The pain is worse at night and when rising from a seated position, but  improves with movement. The patient reports that the left knee is more painful, while the right knee swells more, sometimes extending into the leg. The patient also experiences sporadic instability in the right knee, causing fear of falling.   In June, the patient received bilateral knee injections which provided significant relief; however, a repeat injection in October did not yield the same benefit. The patient is unable to take NSAIDs and is not currently on any pain relief medication.   The patient also reports ongoing lower back pain, possibly related to her spondylitis. The pain is located in the lower back or possibly the right hip, and is worse when standing in one place for extended periods. The patient also notes pain in both thumbs.   The patient is currently on Humira  for inflammation but reports minimal relief in knee and back pain. The patient also has a scheduled toe amputation in January due to hammer toes.    Previous HPI 02/27/2023 Samantha Mcgrath is a 73 y.o. female here for follow up for ankylosing spondylitis on Humira  40 mg subcu q. 14 days.  Knee pain significantly improved after bilateral steroid injections last visit benefit has worn off during the past 1 month.  Hand pain is worse at the left thumb this is usually more provoked with use.  She feels occasional knee buckling type sensation but does not specifically get locked in place and has not had any falls.   Previous HPI 11/27/2022 Samantha Mcgrath is a 73 y.o. female here for follow up for ankylosing spondylitis on Humira  40 mg subcu q. 14 days.  Joint pain is doing okay in some areas but has left thumb and bilateral knee pain. Knee pain is especially bad with any deep bending. She cannot get up from the floor without assistance or severe pain, also climbing stairs has to lead with just one side. Does see visible swelling on some days. She has increased ankle swelling recently also, increased after trip to the mountains 2  weeks ago.   Previous HPI 08/25/2022 Samantha Mcgrath is a 73 y.o. female here for follow up for ankylosing spondylitis on Humira  40 mg subcu q. 14 days.  She is now been on treatment for closer to 4 months is tolerating the medicine fine with no injection reaction problem.  She has not had any significant infections since last visit.  So far she is not sure how much the medication is helping with joint pain and stiffness.  Is not having severe back pain issues or awakening at night but still has some morning stiffness.  Only new joint issue is left ankle pain affecting the front and side of her ankle started since about 1 week ago.  Not associated with visible swelling or bruising.  She does not recall any specific injury associated with this and painful with movement also gets pain if she has any pressure on that area lying in bed at night.   Previous HPI 06/26/22 Samantha Mcgrath is a 73 y.o.  female here for follow up for ankylosing spondylitis on Humira  40 mg [place] q14days. She felt an improvement with steroid course but is back to about the same amount of symptoms as before. She also notices some left thumb pain with the cold weather that is unusual for her. So far no difference since starting Humiea but only took 2 doses so far. No intolerance or problems either.    Previous HPI 04/24/22 Samantha Mcgrath is a 73 y.o. female here for a second opinion and transfer of care previously saw Dr. Curt in June for worsening chronic back and joint pain newly diagnosed with ankylosing spondylitis. She was recommended to start treatment with Humira  for axial disease but did not feel safe doing so.  Evaluation included x-ray imaging and lab test at thought to be consistent based on abnormal inflammatory markers and syndesmophytes on lumbar spine x-ray Back pain has been an ongoing problem for years but has been more severe or limiting within the past year or 2.  She experiences pain notably when trying to lie flat in  bed but also when standing or walking for prolonged times.  Gets morning stiffness lasting about 1 hour on average also becomes very stiff again if seated position for more than a few minutes.  Notices initial improvement once up and moving but gets increased pain again if walking for too long.  Does awaken her from sleep some nights.  She does not have issues with numbness or radiation of pain into the legs does not experience weakness and had no falls.  She has tried taking ibuprofen 800 mg 3 times daily or naproxen 500 mg twice daily which have only small benefit in her symptoms. She has diabetes and previous amputation of 2nd toe on right foot with osteomyelitis. Also history of gout started years ago in her right great toe but did not require long term medication. Started to have inflammation again and now on allopurinol  250 mg daily. Was previously on colchicine  for treatment but off this without ongoing prophylaxis at this time.  She denies any history of inflammatory eye problems.  She experiences diarrhea occasionally no history of inflammatory bowel disease.   Labs reviewed ANA neg RF neg ESR 44 CRP 79   Review of Systems  Constitutional:  Negative for fatigue.  HENT:  Negative for mouth sores and mouth dryness.   Eyes:  Negative for dryness.  Respiratory:  Negative for shortness of breath.   Cardiovascular:  Negative for chest pain and palpitations.  Gastrointestinal:  Negative for blood in stool, constipation and diarrhea.  Endocrine: Negative for increased urination.  Genitourinary:  Negative for involuntary urination.  Musculoskeletal:  Positive for joint pain and joint pain. Negative for gait problem, joint swelling, myalgias, muscle weakness, morning stiffness, muscle tenderness and myalgias.  Skin:  Negative for color change, rash, hair loss and sensitivity to sunlight.  Allergic/Immunologic: Negative for susceptible to infections.  Neurological:  Negative for dizziness and  headaches.  Hematological:  Negative for swollen glands.  Psychiatric/Behavioral:  Negative for depressed mood and sleep disturbance. The patient is not nervous/anxious.     PMFS History:  Patient Active Problem List   Diagnosis Date Noted   Bilateral knee pain 11/27/2022   Osteoarthritis of left thumb 06/26/2022   Ankylosing spondylitis (HCC) 04/24/2022   High risk medication use 04/24/2022   Chronic gouty arthritis 02/23/2021   Osteomyelitis due to type 2 diabetes mellitus (HCC) 10/04/2020   Osteomyelitis of ankle or foot, acute, right (  HCC) 10/04/2020   Chronic cough 01/06/2020   Chronic renal failure, stage 3a (HCC) 10/08/2019   Primary insomnia 03/28/2019   Primary osteoarthritis of left knee 12/24/2018   Combined forms of age-related cataract of right eye 08/15/2018   Gastroesophageal reflux disease without esophagitis 01/29/2018   Asthma 01/28/2018   Hyperlipidemia 01/28/2018   Type 2 diabetes mellitus without complication, with long-term current use of insulin  (HCC) 01/28/2018   Retinal detachment of left eye with multiple breaks 11/14/2016   Morbid obesity (HCC) 08/17/2015   Postoperative wound dehiscence 08/17/2015   Ulcer of right leg, with fat layer exposed (HCC) 08/17/2015   Wound, surgical, infected, subsequent encounter 08/17/2015   Essential hypertension 06/01/2014    Past Medical History:  Diagnosis Date   Asthma    Bronchitis    Cataract    bilateral   Depression    Diabetes mellitus without complication (HCC)    GERD (gastroesophageal reflux disease)    Gout    Hyperlipidemia    Hypertension    Pneumonia    Restrictive airway disease    Retinal detachment    left    Family History  Problem Relation Age of Onset   Cancer Son    Past Surgical History:  Procedure Laterality Date   AMPUTATION TOE Right 10/06/2020   Procedure: AMPUTATION 2ND TOE;  Surgeon: Burt Fus, DPM;  Location: [facility];  Service: Podiatry;  Laterality: Right;    AMPUTATION TOE Left 06/02/2021   Procedure: Left 2nd toe amputation;  Surgeon: Kit Rush, MD;  Location: [facility];  Service: Orthopedics;  Laterality: Left;   ANKLE SURGERY     BLADDER SURGERY     BREAST BIOPSY Left    EYE SURGERY     MELANOMA EXCISION     Of Right Leg   TONSILLECTOMY     TUBAL LIGATION     Social History   Social History Narrative   Diet: Blank      Do you drink/ eat things with caffeine? Yes      Marital status:  Divorced                             What year were you married ? 1969      Do you live in a house, apartment,assistred living, condo, trailer, etc.)? House      Is it one or more stories? One      How many persons live in your home ? 1      Do you have any pets in your home ?(please list) No      Highest Level of education completed: PHD      Current or past profession: Clergy      Do you exercise?   No                           Type & how often       ADVANCED DIRECTIVES (Please bring copies)      Do you have a living will? Yes      Do you have a DNR form?   Yes                   If not, do you want to discuss one?       Do you have signed POA?HPOA forms?  Yes  If so, please bring to your appointment      FUNCTIONAL STATUS- To be completed by Spouse / child / Staff       Do you have difficulty bathing or dressing yourself ? No      Do you have difficulty preparing food or eating ? No      Do you have difficulty managing your mediation ? No      Do you have difficulty managing your finances ? No      Do you have difficulty affording your medication ? No      Immunization History  Administered Date(s) Administered   Influenza Split 03/10/2011, 03/28/2012, 03/03/2014, 04/02/2015   Influenza, High Dose Seasonal PF 03/28/2019, 03/17/2020   Influenza,inj,Quad PF,6-35 Mos 03/19/2018   Influenza,trivalent, recombinat, inj, PF 03/03/2014   Influenza-Unspecified 03/10/2011, 03/28/2012   Moderna  Sars-Covid-2 Vaccination 07/01/2019, 07/29/2019, 04/12/2020   Tdap 10/28/2014     Objective: Vital Signs: BP 99/64 (BP Location: Left Arm, Patient Position: Sitting, Cuff Size: Normal)   Pulse 66   Resp 16   Ht 5' 5 (1.651 m)   Wt 210 lb 12.8 oz (95.6 kg)   BMI 35.08 kg/m    Physical Exam Constitutional:      Appearance: She is obese.   Eyes:     Conjunctiva/sclera: Conjunctivae normal.    Cardiovascular:     Rate and Rhythm: Normal rate and regular rhythm.  Pulmonary:     Effort: Pulmonary effort is normal.     Breath sounds: Normal breath sounds.   Musculoskeletal:     Right lower leg: No edema.     Left lower leg: No edema.   Skin:    General: Skin is warm and dry.     Findings: No rash.   Neurological:     Mental Status: She is alert.   Psychiatric:        Mood and Affect: Mood normal.      Musculoskeletal Exam:  Shoulders full ROM no tenderness or swelling Elbows full ROM no tenderness or swelling Wrists full ROM no tenderness or swelling Fingers full ROM, left thumb tenderness to pressure at 1st MCP joint with slight hyperextension, no swelling Tenderness lateral hip and over SI joint b/l, Some back tenderness midline and paraspinal but without radiation Knees full ROM, some anterior joint line tenderness b/l with no palpable effusions    Investigation: No additional findings.  Imaging: No results found.  Recent Labs: Lab Results  Component Value Date   WBC 8.0 11/29/2023   HGB 14.6 11/29/2023   PLT 291 11/29/2023   NA 140 11/29/2023   K 3.8 11/29/2023   CL 98 11/29/2023   CO2 28 11/29/2023   GLUCOSE 99 11/29/2023   BUN 50 (H) 11/29/2023   CREATININE 1.65 (H) 11/29/2023   BILITOT 0.6 11/29/2023   ALKPHOS 66 10/05/2020   AST 16 11/29/2023   ALT 15 11/29/2023   PROT 7.1 11/29/2023   ALBUMIN 3.2 (L) 10/05/2020   CALCIUM  10.1 11/29/2023   QFTBGOLDPLUS NEGATIVE 11/29/2023    Speciality Comments: No specialty comments  available.  Procedures:  No procedures performed Allergies: Procaine   Assessment / Plan:     Visit Diagnoses: Ankylosing spondylitis of multiple sites in spine (HCC) - Currently off medication. - Plan: XR Lumbar Spine 2-3 Views, XR Pelvis 1-2 Views, Sedimentation rate Chronic back pain with suspected ankylosing spondylitis. Has degenerative disease and age 21 with co morbidities could also be primarily structural. Getting updated imaging today.  Discussed disease nature, potential joint fusion, and medication options including Cosentyx. Explained side effects and need for lab monitoring.  Potential benefit with switch to IL-17 blocker having less risk for bacterial infections than the Humira . - Order spine X-ray to assess current status. - Recheck blood tests for baseline lab numbers. - Plan to try switch to Cosentyx 150 mg Coffee monthly for AS.  High risk medication use - If inflammation markers are high or symptoms worsen off Humira , consider alternative treatments such as Tremfya, Cosentyx, or Taltz. - Plan: CBC with Differential/Platelet, Comprehensive metabolic panel with GFR, QuantiFERON-TB Gold Plus Reviewed risk of medication including injection site reactions, infections, malignancy, possibility of cytopenia or hepatotoxicity requiring lab monitoring. - Check a CBC CMP and QuantiFERON baseline for medication monitoring anticipating starting biologic DMARD probably Cosentyx or Toltz  Chronic pain of both knees - S/P Large Joint Inj: bilateral knee on 08/29/2023  Osteoarthritis of knee and thumb Osteoarthritis in knees and thumb, with dominant thumb most affected. Discussed non-surgical management and uncertain benefit of Cosentyx for knee pain. - Continue anti-inflammatories and compression gloves for thumb pain. - Apply heat to affected areas as needed.    Orders: Orders Placed This Encounter  Procedures   XR Lumbar Spine 2-3 Views   XR Pelvis 1-2 Views   Sedimentation rate   CBC  with Differential/Platelet   Comprehensive metabolic panel with GFR   QuantiFERON-TB Gold Plus   No orders of the defined types were placed in this encounter.    Follow-Up Instructions: Return in about 3 months (around 02/29/2024) for AS/OA COS start f/u 3mos.   Lonni LELON Ester, MD  Note - This record has been created using [employer].  Chart creation errors have been sought, but may not always  have been located. Such creation errors do not reflect on  the standard of medical care.

## 2023-11-29 ENCOUNTER — Encounter: Payer: Self-pay | Admitting: Internal Medicine

## 2023-11-29 ENCOUNTER — Ambulatory Visit

## 2023-11-29 ENCOUNTER — Ambulatory Visit: Attending: Internal Medicine | Admitting: Internal Medicine

## 2023-11-29 ENCOUNTER — Telehealth: Payer: Self-pay

## 2023-11-29 VITALS — BP 99/64 | HR 66 | Resp 16 | Ht 65.0 in | Wt 210.8 lb

## 2023-11-29 DIAGNOSIS — Z79899 Other long term (current) drug therapy: Secondary | ICD-10-CM

## 2023-11-29 DIAGNOSIS — M25561 Pain in right knee: Secondary | ICD-10-CM | POA: Diagnosis not present

## 2023-11-29 DIAGNOSIS — G8929 Other chronic pain: Secondary | ICD-10-CM | POA: Diagnosis not present

## 2023-11-29 DIAGNOSIS — M450 Ankylosing spondylitis of multiple sites in spine: Secondary | ICD-10-CM | POA: Diagnosis not present

## 2023-11-29 DIAGNOSIS — M25562 Pain in left knee: Secondary | ICD-10-CM

## 2023-11-29 DIAGNOSIS — M1A9XX0 Chronic gout, unspecified, without tophus (tophi): Secondary | ICD-10-CM

## 2023-11-29 NOTE — Telephone Encounter (Signed)
-----   Message from Sherry GORMAN Pennant sent at 11/29/2023 11:24 AM EDT ----- Patient is Cosentyx SQ new start.  Patient dose will be for ankylosing spondylitis 150 mg every 7 days for 5 weeks and then 150 mg every 28 days.  Prescription will be sent to pharmacy pending lab results and insurance approval.  Patient took home Cosentyx PAP application today. She will return with income documents. Prescriber form placed in Dr. Joyce folder for signature

## 2023-11-29 NOTE — Progress Notes (Signed)
 Pharmacy Note  Subjective:  Patient presents today to [facility] for follow up office visit.   Patient was seen by the pharmacist for counseling on Cosentyx for ankylosing spondylitis. Previously on Humira  (discontinued due to waning response)  History of inflammatory bowel disease: No  Objective:  CBC    Component Value Date/Time   WBC 7.7 05/31/2023 1155   RBC 4.91 05/31/2023 1155   HGB 15.1 05/31/2023 1155   HCT 47.0 (H) 05/31/2023 1155   PLT 319 05/31/2023 1155   MCV 95.7 05/31/2023 1155   MCH 30.8 05/31/2023 1155   MCHC 32.1 05/31/2023 1155   RDW 12.8 05/31/2023 1155   LYMPHSABS 2,438 02/27/2023 1347   MONOABS 1.1 (H) 10/08/2020 0636   EOSABS 408 05/31/2023 1155   BASOSABS 100 05/31/2023 1155    CMP     Component Value Date/Time   NA 141 05/31/2023 1155   K 4.4 05/31/2023 1155   CL 101 05/31/2023 1155   CO2 30 05/31/2023 1155   GLUCOSE 125 (H) 05/31/2023 1155   BUN 35 (H) 05/31/2023 1155   CREATININE 1.30 (H) 05/31/2023 1155   CALCIUM  10.0 05/31/2023 1155   PROT 7.2 05/31/2023 1155   ALBUMIN 3.2 (L) 10/05/2020 0302   AST 12 05/31/2023 1155   ALT 12 05/31/2023 1155   ALKPHOS 66 10/05/2020 0302   BILITOT 0.4 05/31/2023 1155   GFRNONAA >60 06/01/2021 1450    Baseline Immunosuppressant Therapy Labs TB GOLD    Latest Ref Rng & Units 04/24/2022   11:01 AM  Quantiferon TB Gold  Quantiferon TB Gold Plus NEGATIVE NEGATIVE    Hepatitis Panel    Latest Ref Rng & Units 04/24/2022   11:01 AM  Hepatitis  Hep B Surface Ag NON-REACTIVE NON-REACTIVE   Hep B IgM NON-REACTIVE NON-REACTIVE   Hep C Ab NON-REACTIVE NON-REACTIVE    HIV Lab Results  Component Value Date   HIV NON-REACTIVE 04/24/2022   Immunoglobulins   SPEP    Latest Ref Rng & Units 05/31/2023   11:55 AM  Serum Protein Electrophoresis  Total Protein 6.1 - 8.1 g/dL 7.2     Assessment/Plan:  Counseled patient that Cosentyx is a IL-17 inhibitor.  Counseled patient on purpose,  proper use, and adverse effects of Cosentyx. Reviewed the most common adverse effects of infection (more commonly nasopharyngitis, URTI), inflammatory bowel disease, and allergic reaction.  Counseled patient that Cosentyx should be held prior to scheduled surgery.  Counseled patient to avoid live vaccines while on Cosentyx.  Recommend annual influenza, PCV 15 or PCV20 or Pneumovax 23, and Shingrix as indicated.  Reviewed the importance of regular labs while on Cosentyx.  Will monitor CBC and CMP 1 month after starting and every 3 months routinely thereafter. Will monitor TB gold annually.  Standing orders placed.  Provided patient with medication education material and answered all questions.  Patient consented to Cosentyx.  Will upload consent into patient's chart.  Will apply for Cosentyx through patient's insurance.  Reviewed storage information for Cosentyx.  Advised initial injection must be administered in office.  Patient voiced understanding.    Patient dose will be for ankylosing spondylitis 150 mg every 7 days for 5 weeks and then 150 mg every 28 days.  Prescription will be sent to pharmacy pending lab results and insurance approval.  Patient took home Cosentyx PAP application today. She will return with income documents. Prescriber form placed in Dr. Joyce folder for signature  Sherry Pennant, PharmD, MPH, BCPS, CPP Clinical Pharmacist (Rheumatology  and Pulmonology)

## 2023-11-30 NOTE — Telephone Encounter (Signed)
 Signed prescriber form for [employer] PAP received (retained in [place])

## 2023-12-02 LAB — COMPREHENSIVE METABOLIC PANEL WITH GFR
AG Ratio: 1.5 (calc) (ref 1.0–2.5)
ALT: 15 U/L (ref 6–29)
AST: 16 U/L (ref 10–35)
Albumin: 4.3 g/dL (ref 3.6–5.1)
Alkaline phosphatase (APISO): 85 U/L (ref 37–153)
BUN/Creatinine Ratio: 30 (calc) — ABNORMAL HIGH (ref 6–22)
BUN: 50 mg/dL — ABNORMAL HIGH (ref 7–25)
CO2: 28 mmol/L (ref 20–32)
Calcium: 10.1 mg/dL (ref 8.6–10.4)
Chloride: 98 mmol/L (ref 98–110)
Creat: 1.65 mg/dL — ABNORMAL HIGH (ref 0.60–1.00)
Globulin: 2.8 g/dL (ref 1.9–3.7)
Glucose, Bld: 99 mg/dL (ref 65–99)
Potassium: 3.8 mmol/L (ref 3.5–5.3)
Sodium: 140 mmol/L (ref 135–146)
Total Bilirubin: 0.6 mg/dL (ref 0.2–1.2)
Total Protein: 7.1 g/dL (ref 6.1–8.1)
eGFR: 32 mL/min/{1.73_m2} — ABNORMAL LOW (ref >=60)

## 2023-12-02 LAB — CBC WITH DIFFERENTIAL/PLATELET
Absolute Lymphocytes: 2184 {cells}/uL (ref 850–3900)
Absolute Monocytes: 792 {cells}/uL (ref 200–950)
Basophils Absolute: 72 {cells}/uL (ref 0–200)
Basophils Relative: 0.9 %
Eosinophils Absolute: 200 {cells}/uL (ref 15–500)
Eosinophils Relative: 2.5 %
HCT: 45.6 % — ABNORMAL HIGH (ref 35.0–45.0)
Hemoglobin: 14.6 g/dL (ref 11.7–15.5)
MCH: 29.9 pg (ref 27.0–33.0)
MCHC: 32 g/dL (ref 32.0–36.0)
MCV: 93.3 fL (ref 80.0–100.0)
MPV: 10.9 fL (ref 7.5–12.5)
Monocytes Relative: 9.9 %
Neutro Abs: 4752 {cells}/uL (ref 1500–7800)
Neutrophils Relative %: 59.4 %
Platelets: 291 10*3/uL (ref 140–400)
RBC: 4.89 Million/uL (ref 3.80–5.10)
RDW: 13.7 % (ref 11.0–15.0)
Total Lymphocyte: 27.3 %
WBC: 8 10*3/uL (ref 3.8–10.8)

## 2023-12-02 LAB — QUANTIFERON-TB GOLD PLUS
Mitogen-NIL: 9.02 [IU]/mL
NIL: 0.04 [IU]/mL
QuantiFERON-TB Gold Plus: NEGATIVE
TB1-NIL: 0.01 [IU]/mL
TB2-NIL: 0 [IU]/mL

## 2023-12-02 LAB — SEDIMENTATION RATE: Sed Rate: 25 mm/h (ref 0–30)

## 2023-12-03 ENCOUNTER — Other Ambulatory Visit (HOSPITAL_COMMUNITY): Payer: Self-pay

## 2023-12-03 NOTE — Telephone Encounter (Signed)
 Received notification from [facility] regarding a prior authorization for COSENTYX SQ. Authorization has been APPROVED from 06/06/2023 to 06/04/2024. Approval letter sent to scan center.  Per test claim, copay for 28 days supply is $1048.06  [employer] PAP will need to be submitted  Sherry Pennant, PharmD, MPH, BCPS, CPP Clinical Pharmacist (Rheumatology and Pulmonology)

## 2023-12-03 NOTE — Telephone Encounter (Signed)
 Submitted a Prior Authorization request to [facility] for COSENTYX SQ via CoverMyMeds. Will update once we receive a response.  Key: [id number]

## 2023-12-11 NOTE — Telephone Encounter (Signed)
 Submitted Patient Assistance Application to [employer] for COSENTYX  SQ along with provider portion, patient portion, PA, medication list, insurance card copy and income documents. Will update patient when we receive a response.  Phone: [phone] Fax: [phone]  Sherry Pennant, PharmD, MPH, BCPS, CPP Clinical Pharmacist (Rheumatology and Pulmonology)

## 2023-12-12 NOTE — Telephone Encounter (Signed)
 Received a fax from  [employer] regarding an approval for COSENTYX  SQ patient assistance from 12/11/2023 to 06/04/2024. Approval letter sent to scan center.  Patient iD: [id number] Phone: [phone] Fax: [phone]  Called company to provide consent to ship to patient's home  Sherry Pennant, PharmD, MPH, BCPS, CPP Clinical Pharmacist (Rheumatology and Pulmonology)

## 2023-12-12 NOTE — Telephone Encounter (Signed)
 Called patient regarding Cosentyx  approval. Patient is looking forward to a call from [employer] to coordinate shipment to her home. Called [employer] to give consent to ship Cosentyx  to patient's address. Consent received. Patient notified to contact [employer] if she has not heard back from them by Monday.   Deleta Colt PharmD Candidate [phone]  [facility]

## 2023-12-13 MED ORDER — COSENTYX SENSOREADY PEN 150 MG/ML ~~LOC~~ SOAJ: SUBCUTANEOUS | 2 refills | Status: DC

## 2023-12-13 MED ORDER — COSENTYX SENSOREADY PEN 150 MG/ML ~~LOC~~ SOAJ: SUBCUTANEOUS | 0 refills | Status: DC

## 2023-12-13 NOTE — Addendum Note (Signed)
 Addended by: DAYNE SHERRY RAMAN on: 12/13/2023 09:54 AM   Modules accepted: Orders

## 2024-01-09 DIAGNOSIS — Z6834 Body mass index (BMI) 34.0-34.9, adult: Secondary | ICD-10-CM | POA: Diagnosis not present

## 2024-01-09 DIAGNOSIS — S51011A Laceration without foreign body of right elbow, initial encounter: Secondary | ICD-10-CM | POA: Diagnosis not present

## 2024-01-21 DIAGNOSIS — Z4802 Encounter for removal of sutures: Secondary | ICD-10-CM | POA: Diagnosis not present

## 2024-01-28 DIAGNOSIS — I10 Essential (primary) hypertension: Secondary | ICD-10-CM | POA: Diagnosis not present

## 2024-01-28 DIAGNOSIS — R42 Dizziness and giddiness: Secondary | ICD-10-CM | POA: Diagnosis not present

## 2024-01-28 DIAGNOSIS — N1832 Chronic kidney disease, stage 3b: Secondary | ICD-10-CM | POA: Diagnosis not present

## 2024-01-28 DIAGNOSIS — R0982 Postnasal drip: Secondary | ICD-10-CM | POA: Diagnosis not present

## 2024-01-28 DIAGNOSIS — E1165 Type 2 diabetes mellitus with hyperglycemia: Secondary | ICD-10-CM | POA: Diagnosis not present

## 2024-02-18 NOTE — Progress Notes (Deleted)
 Office Visit Note  Patient: Samantha Mcgrath             Date of Birth: August 03, 1950           MRN: [id number]             PCP: Rolinda Millman, MD Referring: Rolinda Millman, MD Visit Date: 03/03/2024   Subjective:  No chief complaint on file.   History of Present Illness: Samantha Mcgrath is a 73 y.o. female here for follow up with osteoarthritis and ankylosing spondylitis off humira  since last visit who presents with worsening joint pain and stiffness.   Previous HPI 11/29/2023 Samantha Mcgrath is a 73 y.o. female here for follow up with osteoarthritis and ankylosing spondylitis off humira  since last visit who presents with worsening joint pain and stiffness. She presents with worsening back pain.   She has been experiencing significant worsening of her back pain, which she attributes to her known diagnosis of ankylosing spondylitis. The pain has become more bothersome recently. No mobility issues are reported, but the back pain is significant.   She has a history of receiving knee injections for osteoarthritis, which provided relief for about two months. These injections were administered in September of last year and again in March of this year. Imaging had not shown a severe loss of the joint space suggesting inflammation is contributing beyond her degree of OA.   She experiences persistent pain in her left thumb, which she attributes to osteoarthritis. The pain affects her dominant hand, and she uses compression gloves for relief. Her hands tend to freeze up when exposed to cold air, such as from air conditioning while driving. Being left-handed exacerbates the issue.     Previous HPI 08/29/2023 Samantha Mcgrath is a 73 year old female with osteoarthritis and ankylosing spondylitis off humira  since last visit who presents with worsening joint pain and stiffness.   She experiences significant pain in her knees and thumbs, describing them as hurting constantly. Her fingers occasionally  'freeze,' preventing her from releasing objects due to stiffness, a symptom that has developed over the past five to six months.   She has a history of knee injections, with the first injection in June of the previous year providing significant relief, described as 'a miracle.' However, a subsequent injection was less effective, and she has not had any additional injections since then. Her left knee is particularly problematic, causing pain that disrupts her sleep. She describes a sensation of something 'poking' in the knee, especially at night, and notes that the left knee is usually worse than the right. She experiences occasional leg swelling, particularly after attending events like the annual conference at [facility].   Her back pain, which initially brought her to seek medical care, is stable and possibly improved. She is currently off Humira  and monitoring her symptoms. She notes that her back pain is less noticeable due to increased pain in other areas.   She underwent a toe amputation in January, which has improved her ability to walk without complications. Despite this, her knees continue to cause significant discomfort.   Her son was diagnosed with cancer in November, and she has moved back to [place] to assist with his care, which has added stress to her life. She is retiring at the end of June and has been physically active with moving boxes, which may exacerbate her symptoms. She reports poor sleep, primarily due to knee pain and worry about her son's health. She notes  that her back does not bother her at night, but her knees do.   She has been using her hands extensively, particularly on the computer, which may contribute to her thumb pain. She has not used any supportive gloves or braces for her thumbs yet.        Previous HPI 05/31/2023 Samantha Mcgrath is a 73 y.o. female here for follow up for ankylosing spondylitis on Humira  40 mg subcu q. 14 days.  She presents with  worsening bilateral knee pain. The pain is worse at night and when rising from a seated position, but improves with movement. The patient reports that the left knee is more painful, while the right knee swells more, sometimes extending into the leg. The patient also experiences sporadic instability in the right knee, causing fear of falling.   In June, the patient received bilateral knee injections which provided significant relief; however, a repeat injection in October did not yield the same benefit. The patient is unable to take NSAIDs and is not currently on any pain relief medication.   The patient also reports ongoing lower back pain, possibly related to her spondylitis. The pain is located in the lower back or possibly the right hip, and is worse when standing in one place for extended periods. The patient also notes pain in both thumbs.   The patient is currently on Humira  for inflammation but reports minimal relief in knee and back pain. The patient also has a scheduled toe amputation in January due to hammer toes.    Previous HPI 02/27/2023 Samantha Mcgrath is a 73 y.o. female here for follow up for ankylosing spondylitis on Humira  40 mg subcu q. 14 days.  Knee pain significantly improved after bilateral steroid injections last visit benefit has worn off during the past 1 month.  Hand pain is worse at the left thumb this is usually more provoked with use.  She feels occasional knee buckling type sensation but does not specifically get locked in place and has not had any falls.   Previous HPI 11/27/2022 Samantha Mcgrath is a 73 y.o. female here for follow up for ankylosing spondylitis on Humira  40 mg subcu q. 14 days.  Joint pain is doing okay in some areas but has left thumb and bilateral knee pain. Knee pain is especially bad with any deep bending. She cannot get up from the floor without assistance or severe pain, also climbing stairs has to lead with just one side. Does see visible swelling on  some days. She has increased ankle swelling recently also, increased after trip to the mountains 2 weeks ago.   Previous HPI 08/25/2022 Samantha Mcgrath is a 73 y.o. female here for follow up for ankylosing spondylitis on Humira  40 mg subcu q. 14 days.  She is now been on treatment for closer to 4 months is tolerating the medicine fine with no injection reaction problem.  She has not had any significant infections since last visit.  So far she is not sure how much the medication is helping with joint pain and stiffness.  Is not having severe back pain issues or awakening at night but still has some morning stiffness.  Only new joint issue is left ankle pain affecting the front and side of her ankle started since about 1 week ago.  Not associated with visible swelling or bruising.  She does not recall any specific injury associated with this and painful with movement also gets pain if she has any pressure  on that area lying in bed at night.   Previous HPI 06/26/22 Samantha Mcgrath is a 73 y.o. female here for follow up for ankylosing spondylitis on Humira  40 mg [place] q14days. She felt an improvement with steroid course but is back to about the same amount of symptoms as before. She also notices some left thumb pain with the cold weather that is unusual for her. So far no difference since starting Humiea but only took 2 doses so far. No intolerance or problems either.    Previous HPI 04/24/22 Samantha Mcgrath is a 73 y.o. female here for a second opinion and transfer of care previously saw Dr. Curt in June for worsening chronic back and joint pain newly diagnosed with ankylosing spondylitis. She was recommended to start treatment with Humira  for axial disease but did not feel safe doing so.  Evaluation included x-ray imaging and lab test at thought to be consistent based on abnormal inflammatory markers and syndesmophytes on lumbar spine x-ray Back pain has been an ongoing problem for years but has been more  severe or limiting within the past year or 2.  She experiences pain notably when trying to lie flat in bed but also when standing or walking for prolonged times.  Gets morning stiffness lasting about 1 hour on average also becomes very stiff again if seated position for more than a few minutes.  Notices initial improvement once up and moving but gets increased pain again if walking for too long.  Does awaken her from sleep some nights.  She does not have issues with numbness or radiation of pain into the legs does not experience weakness and had no falls.  She has tried taking ibuprofen 800 mg 3 times daily or naproxen 500 mg twice daily which have only small benefit in her symptoms. She has diabetes and previous amputation of 2nd toe on right foot with osteomyelitis. Also history of gout started years ago in her right great toe but did not require long term medication. Started to have inflammation again and now on allopurinol  250 mg daily. Was previously on colchicine  for treatment but off this without ongoing prophylaxis at this time.  She denies any history of inflammatory eye problems.  She experiences diarrhea occasionally no history of inflammatory bowel disease.   Labs reviewed ANA neg RF neg ESR 44 CRP 79   No Rheumatology ROS completed.   PMFS History:  Patient Active Problem List   Diagnosis Date Noted   Bilateral knee pain 11/27/2022   Osteoarthritis of left thumb 06/26/2022   Ankylosing spondylitis (HCC) 04/24/2022   High risk medication use 04/24/2022   Chronic gouty arthritis 02/23/2021   Osteomyelitis due to type 2 diabetes mellitus (HCC) 10/04/2020   Osteomyelitis of ankle or foot, acute, right (HCC) 10/04/2020   Chronic cough 01/06/2020   Chronic renal failure, stage 3a (HCC) 10/08/2019   Primary insomnia 03/28/2019   Primary osteoarthritis of left knee 12/24/2018   Combined forms of age-related cataract of right eye 08/15/2018   Gastroesophageal reflux disease without  esophagitis 01/29/2018   Asthma 01/28/2018   Hyperlipidemia 01/28/2018   Type 2 diabetes mellitus without complication, with long-term current use of insulin  (HCC) 01/28/2018   Retinal detachment of left eye with multiple breaks 11/14/2016   Morbid obesity (HCC) 08/17/2015   Postoperative wound dehiscence 08/17/2015   Ulcer of right leg, with fat layer exposed (HCC) 08/17/2015   Wound, surgical, infected, subsequent encounter 08/17/2015   Essential hypertension 06/01/2014  Past Medical History:  Diagnosis Date   Asthma    Bronchitis    Cataract    bilateral   Depression    Diabetes mellitus without complication (HCC)    GERD (gastroesophageal reflux disease)    Gout    Hyperlipidemia    Hypertension    Pneumonia    Restrictive airway disease    Retinal detachment    left    Family History  Problem Relation Age of Onset   Cancer Son    Past Surgical History:  Procedure Laterality Date   AMPUTATION TOE Right 10/06/2020   Procedure: AMPUTATION 2ND TOE;  Surgeon: Burt Fus, DPM;  Location: [facility];  Service: Podiatry;  Laterality: Right;   AMPUTATION TOE Left 06/02/2021   Procedure: Left 2nd toe amputation;  Surgeon: Kit Rush, MD;  Location: [facility];  Service: Orthopedics;  Laterality: Left;   ANKLE SURGERY     BLADDER SURGERY     BREAST BIOPSY Left    EYE SURGERY     MELANOMA EXCISION     Of Right Leg   TONSILLECTOMY     TUBAL LIGATION     Social History   Social History Narrative   Diet: Blank      Do you drink/ eat things with caffeine? Yes      Marital status:  Divorced                             What year were you married ? 1969      Do you live in a house, apartment,assistred living, condo, trailer, etc.)? House      Is it one or more stories? One      How many persons live in your home ? 1      Do you have any pets in your home ?(please list) No      Highest Level of education completed: PHD      Current or past  profession: Clergy      Do you exercise?   No                           Type & how often       ADVANCED DIRECTIVES (Please bring copies)      Do you have a living will? Yes      Do you have a DNR form?   Yes                   If not, do you want to discuss one?       Do you have signed POA?HPOA forms?  Yes               If so, please bring to your appointment      FUNCTIONAL STATUS- To be completed by Spouse / child / Staff       Do you have difficulty bathing or dressing yourself ? No      Do you have difficulty preparing food or eating ? No      Do you have difficulty managing your mediation ? No      Do you have difficulty managing your finances ? No      Do you have difficulty affording your medication ? No      Immunization History  Administered Date(s) Administered   INFLUENZA, HIGH DOSE SEASONAL PF 03/28/2019, 03/17/2020   Influenza  Split 03/10/2011, 03/28/2012, 03/03/2014, 04/02/2015   Influenza,inj,Quad PF,6-35 Mos 03/19/2018   Influenza,trivalent, recombinat, inj, PF 03/03/2014   Influenza-Unspecified 03/10/2011, 03/28/2012   Moderna Sars-Covid-2 Vaccination 07/01/2019, 07/29/2019, 04/12/2020   Tdap 10/28/2014     Objective: Vital Signs: There were no vitals taken for this visit.   Physical Exam   Musculoskeletal Exam: ***  CDAI Exam: CDAI Score: -- Patient Global: --; Provider Global: -- Swollen: --; Tender: -- Joint Exam 03/03/2024   No joint exam has been documented for this visit   There is currently no information documented on the homunculus. Go to the Rheumatology activity and complete the homunculus joint exam.  Investigation: No additional findings.  Imaging: No results found.  Recent Labs: Lab Results  Component Value Date   WBC 8.0 11/29/2023   HGB 14.6 11/29/2023   PLT 291 11/29/2023   NA 140 11/29/2023   K 3.8 11/29/2023   CL 98 11/29/2023   CO2 28 11/29/2023   GLUCOSE 99 11/29/2023   BUN 50 (H) 11/29/2023   CREATININE 1.65  (H) 11/29/2023   BILITOT 0.6 11/29/2023   ALKPHOS 66 10/05/2020   AST 16 11/29/2023   ALT 15 11/29/2023   PROT 7.1 11/29/2023   ALBUMIN 3.2 (L) 10/05/2020   CALCIUM  10.1 11/29/2023   QFTBGOLDPLUS NEGATIVE 11/29/2023    Speciality Comments: No specialty comments available.  Procedures:  No procedures performed Allergies: Procaine   Assessment / Plan:     Visit Diagnoses: No diagnosis found.  ***  Orders: No orders of the defined types were placed in this encounter.  No orders of the defined types were placed in this encounter.    Follow-Up Instructions: No follow-ups on file.   Brena Windsor M Jaelin Devincentis, CMA  Note - This record has been created using Animal nutritionist.  Chart creation errors have been sought, but may not always  have been located. Such creation errors do not reflect on  the standard of medical care.

## 2024-02-22 ENCOUNTER — Other Ambulatory Visit: Payer: Self-pay | Admitting: Family Medicine

## 2024-02-22 DIAGNOSIS — Z1231 Encounter for screening mammogram for malignant neoplasm of breast: Secondary | ICD-10-CM

## 2024-02-25 ENCOUNTER — Telehealth: Payer: Self-pay | Admitting: Internal Medicine

## 2024-02-25 NOTE — Telephone Encounter (Signed)
 Patient called requesting to speak with the pharmacist regarding the medication that she helped her get through Cover My Meds.

## 2024-02-26 NOTE — Telephone Encounter (Signed)
 Attempted to contact pt to discuss, left VoiceMail informing her that Sherry is currently out of office. Direct office number provided, and advised that I may not be able to help her with her specific issue (depending on what it is) but that if she needed some immediate help that I could try to do what I can. Otherwise I would have Devki follow back up with her when she returns to office on Monday.

## 2024-03-03 ENCOUNTER — Ambulatory Visit: Admitting: Internal Medicine

## 2024-03-03 DIAGNOSIS — G8929 Other chronic pain: Secondary | ICD-10-CM

## 2024-03-03 DIAGNOSIS — Z79899 Other long term (current) drug therapy: Secondary | ICD-10-CM

## 2024-03-03 DIAGNOSIS — M450 Ankylosing spondylitis of multiple sites in spine: Secondary | ICD-10-CM

## 2024-03-03 NOTE — Telephone Encounter (Signed)
 ATC patient regarding Cosentyx . Unable to reach.  She needs f/u appt with Dr. Jeannetta and labs to be updated. Left details on VM  Sherry Pennant, PharmD, MPH, BCPS, CPP Clinical Pharmacist [facility])

## 2024-03-14 NOTE — Telephone Encounter (Signed)
 ATC #2 patietn regarding medication questions. Appt with Dr. Jeannetta not scheduled. Left VM that she needs appt for further refills. Closing f/u

## 2024-04-04 ENCOUNTER — Ambulatory Visit
Admission: RE | Admit: 2024-04-04 | Discharge: 2024-04-04 | Disposition: A | Discharge status: Home / Self Care | Source: Ambulatory Visit | Attending: Family Medicine | Admitting: Family Medicine

## 2024-04-04 DIAGNOSIS — Z1231 Encounter for screening mammogram for malignant neoplasm of breast: Secondary | ICD-10-CM | POA: Diagnosis not present

## 2024-04-14 DIAGNOSIS — G8929 Other chronic pain: Secondary | ICD-10-CM | POA: Diagnosis not present

## 2024-04-14 DIAGNOSIS — N1832 Chronic kidney disease, stage 3b: Secondary | ICD-10-CM | POA: Diagnosis not present

## 2024-04-14 DIAGNOSIS — M25562 Pain in left knee: Secondary | ICD-10-CM | POA: Diagnosis not present

## 2024-04-14 DIAGNOSIS — M25561 Pain in right knee: Secondary | ICD-10-CM | POA: Diagnosis not present

## 2024-04-14 DIAGNOSIS — I10 Essential (primary) hypertension: Secondary | ICD-10-CM | POA: Diagnosis not present

## 2024-04-14 DIAGNOSIS — E785 Hyperlipidemia, unspecified: Secondary | ICD-10-CM | POA: Diagnosis not present

## 2024-04-14 DIAGNOSIS — E1165 Type 2 diabetes mellitus with hyperglycemia: Secondary | ICD-10-CM | POA: Diagnosis not present

## 2024-04-14 DIAGNOSIS — M459 Ankylosing spondylitis of unspecified sites in spine: Secondary | ICD-10-CM | POA: Diagnosis not present

## 2024-04-21 DIAGNOSIS — I10 Essential (primary) hypertension: Secondary | ICD-10-CM | POA: Diagnosis not present

## 2024-04-21 DIAGNOSIS — M25562 Pain in left knee: Secondary | ICD-10-CM | POA: Diagnosis not present

## 2024-04-21 DIAGNOSIS — G8929 Other chronic pain: Secondary | ICD-10-CM | POA: Diagnosis not present

## 2024-04-21 DIAGNOSIS — M170 Bilateral primary osteoarthritis of knee: Secondary | ICD-10-CM | POA: Diagnosis not present

## 2024-04-21 DIAGNOSIS — M25561 Pain in right knee: Secondary | ICD-10-CM | POA: Diagnosis not present

## 2024-04-30 DIAGNOSIS — M170 Bilateral primary osteoarthritis of knee: Secondary | ICD-10-CM | POA: Diagnosis not present

## 2024-05-07 DIAGNOSIS — M170 Bilateral primary osteoarthritis of knee: Secondary | ICD-10-CM | POA: Diagnosis not present

## 2024-05-08 DIAGNOSIS — J40 Bronchitis, not specified as acute or chronic: Secondary | ICD-10-CM | POA: Diagnosis not present

## 2024-05-08 DIAGNOSIS — R051 Acute cough: Secondary | ICD-10-CM | POA: Diagnosis not present

## 2024-05-08 DIAGNOSIS — J988 Other specified respiratory disorders: Secondary | ICD-10-CM | POA: Diagnosis not present

## 2024-05-14 ENCOUNTER — Other Ambulatory Visit: Payer: Self-pay

## 2024-05-14 DIAGNOSIS — M450 Ankylosing spondylitis of multiple sites in spine: Secondary | ICD-10-CM

## 2024-05-14 DIAGNOSIS — M170 Bilateral primary osteoarthritis of knee: Secondary | ICD-10-CM | POA: Diagnosis not present

## 2024-05-14 DIAGNOSIS — Z79899 Other long term (current) drug therapy: Secondary | ICD-10-CM

## 2024-05-14 MED ORDER — COSENTYX SENSOREADY PEN 150 MG/ML ~~LOC~~ SOAJ: SUBCUTANEOUS | 0 refills | Status: AC

## 2024-05-14 NOTE — Telephone Encounter (Signed)
 Refill request received via fax from [facility] for Cosentyx   Last Fill: 12/13/2023  Labs: 11/29/2023 BUN 50 Creat 1.65 eGFR 32 BUN/ Creatinine 30 HCT 45.6  TB Gold: 11/29/2023 TB Gold negative   Next Visit: 07/01/2023  Last Visit: 11/29/2023  DX: Ankylosing spondylitis of multiple sites in spine   Current Dose per office note 11/29/2023: Cosentyx  150 mg Sharon monthly for AS.   Okay to refill Cosentyx ?   LMOM for patient update labs, provided lab hours

## 2024-05-20 DIAGNOSIS — E119 Type 2 diabetes mellitus without complications: Secondary | ICD-10-CM | POA: Diagnosis not present

## 2024-05-20 DIAGNOSIS — H02831 Dermatochalasis of right upper eyelid: Secondary | ICD-10-CM | POA: Diagnosis not present

## 2024-05-20 DIAGNOSIS — Z961 Presence of intraocular lens: Secondary | ICD-10-CM | POA: Diagnosis not present

## 2024-05-20 DIAGNOSIS — H527 Unspecified disorder of refraction: Secondary | ICD-10-CM | POA: Diagnosis not present

## 2024-05-20 DIAGNOSIS — H43812 Vitreous degeneration, left eye: Secondary | ICD-10-CM | POA: Diagnosis not present

## 2024-05-20 DIAGNOSIS — H40013 Open angle with borderline findings, low risk, bilateral: Secondary | ICD-10-CM | POA: Diagnosis not present

## 2024-05-20 DIAGNOSIS — H35372 Puckering of macula, left eye: Secondary | ICD-10-CM | POA: Diagnosis not present

## 2024-05-20 DIAGNOSIS — H26493 Other secondary cataract, bilateral: Secondary | ICD-10-CM | POA: Diagnosis not present

## 2024-05-20 DIAGNOSIS — H02834 Dermatochalasis of left upper eyelid: Secondary | ICD-10-CM | POA: Diagnosis not present

## 2024-07-01 NOTE — Progress Notes (Signed)
 Samantha Mcgrath                                          MRN: [id number]   07/01/2024   The [facility] Quality Team Specialist reviewed this patient medical record for the purposes of chart review for care gap closure. The following were reviewed: abstraction for care gap closure-controlling blood pressure.    [facility]I Quality Team
